# Patient Record
Sex: Male | Born: 1970 | Race: Black or African American | Hispanic: No | Marital: Married | Smoking: Never smoker
Health system: Southern US, Community
[De-identification: ages and names within clinical notes are randomized; demographics above are authoritative.]

## PROBLEM LIST (undated history)

## (undated) DIAGNOSIS — D689 Coagulation defect, unspecified: Secondary | ICD-10-CM

## (undated) DIAGNOSIS — J309 Allergic rhinitis, unspecified: Secondary | ICD-10-CM

## (undated) DIAGNOSIS — E7800 Pure hypercholesterolemia, unspecified: Secondary | ICD-10-CM

## (undated) HISTORY — DX: Coagulation defect, unspecified: D68.9

## (undated) HISTORY — DX: Pure hypercholesterolemia, unspecified: E78.00

## (undated) HISTORY — DX: Allergic rhinitis, unspecified: J30.9

## (undated) HISTORY — PX: OTHER SURGICAL HISTORY: SHX169

---

## 2011-10-19 ENCOUNTER — Ambulatory Visit (INDEPENDENT_AMBULATORY_CARE_PROVIDER_SITE_OTHER): Payer: BC Managed Care – PPO | Admitting: Internal Medicine

## 2011-10-19 ENCOUNTER — Encounter: Payer: Self-pay | Admitting: Internal Medicine

## 2011-10-19 VITALS — BP 125/73 | HR 75 | Temp 98.3°F | Ht 69.0 in | Wt 179.0 lb

## 2011-10-19 DIAGNOSIS — D1739 Benign lipomatous neoplasm of skin and subcutaneous tissue of other sites: Secondary | ICD-10-CM

## 2011-10-19 DIAGNOSIS — D171 Benign lipomatous neoplasm of skin and subcutaneous tissue of trunk: Secondary | ICD-10-CM

## 2011-10-19 DIAGNOSIS — M6580 Other synovitis and tenosynovitis, unspecified site: Secondary | ICD-10-CM

## 2011-10-19 DIAGNOSIS — M778 Other enthesopathies, not elsewhere classified: Secondary | ICD-10-CM

## 2011-10-19 DIAGNOSIS — M65979 Unspecified synovitis and tenosynovitis, unspecified ankle and foot: Secondary | ICD-10-CM

## 2011-10-19 DIAGNOSIS — J309 Allergic rhinitis, unspecified: Secondary | ICD-10-CM | POA: Insufficient documentation

## 2011-10-19 DIAGNOSIS — M659 Synovitis and tenosynovitis, unspecified: Secondary | ICD-10-CM

## 2011-10-19 DIAGNOSIS — Z23 Encounter for immunization: Secondary | ICD-10-CM

## 2011-10-19 DIAGNOSIS — M7751 Other enthesopathy of right foot: Secondary | ICD-10-CM

## 2011-10-19 DIAGNOSIS — Z0000 Encounter for general adult medical examination without abnormal findings: Secondary | ICD-10-CM

## 2011-10-19 LAB — LIPID PANEL: HDL: 49.4 mg/dL (ref >39.00)

## 2011-10-19 LAB — LDL CHOLESTEROL, DIRECT: Direct LDL: 196.2 mg/dL

## 2011-10-19 NOTE — Progress Notes (Signed)
Subjective:    Patient ID: Bryan [name], male    DOB: 30-Nov-1971, 40 y.o.   MRN: [id number]  HPI Wants to establish for care Sent in by wife and mother  Has been healthy No surgery  No regular exercise--hopes to start working out more  No current outpatient prescriptions on file prior to visit.    No Known Allergies  No past medical history on file.  Past Surgical History  Procedure Date  . None     Family History  Problem Relation Age of Onset  . Cancer Mother     Breast  . Alcohol abuse Father   . Diabetes Paternal Aunt   . Heart disease Paternal Aunt   . Hypertension Neg Hx     History   Social History  . Marital Status: Married    Spouse Name: N/A    Number of Children: 3  . Years of Education: N/A   Occupational History  . 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 Smoking status: Never Smoker   . Smokeless tobacco: Never Used  . Alcohol Use: No  . Drug Use: No  . Sexually Active: Not on file   Other Topics Concern  . Not on file   Social History Narrative   3 children---still all school age   Review of Systems  Constitutional: Negative for fatigue and unexpected weight change.       Wears seat belt  HENT: Positive for congestion and rhinorrhea. Negative for hearing loss, dental problem and tinnitus.        Mild spring allergies occ OTC meds and Neti pot  Eyes: Negative for visual disturbance.       No diplopia or unilateral vision loss  Respiratory: Negative for cough, chest tightness and shortness of breath.   Cardiovascular: Negative for chest pain, palpitations and leg swelling.  Gastrointestinal: Negative for nausea, vomiting, abdominal pain, constipation and blood in stool.       Has small lump on right mid abdomen No pain or recent change Goes back 20 years  Genitourinary: Negative for dysuria, urgency, frequency and difficulty urinating.       No sexual problems  Musculoskeletal: Positive for arthralgias.  Negative for myalgias, back pain and joint swelling.       Intermittent right elbow pain---with movement Goes back 6 months No known injury  Skin: Negative for rash.       No suspicious lesions  Neurological: Negative for dizziness, syncope, weakness, light-headedness, numbness and headaches.  Hematological: Negative for adenopathy. Does not bruise/bleed easily.  Psychiatric/Behavioral: Negative for sleep disturbance and dysphoric mood. The patient is not nervous/anxious.        Objective:   Physical Exam  Constitutional: He appears well-developed and well-nourished. No distress.  HENT:  Head: Normocephalic and atraumatic.  Right Ear: External ear normal.  Left Ear: External ear normal.  Mouth/Throat: Oropharynx is clear and moist. No oropharyngeal exudate.       TMs normal  Eyes: Conjunctivae and EOM are normal. Pupils are equal, round, and reactive to light.       Fundi benign  Neck: Normal range of motion. Neck supple. No thyromegaly present.  Cardiovascular: Normal rate, regular rhythm, normal heart sounds and intact distal pulses.  Exam reveals no gallop.   No murmur heard. Pulmonary/Chest: Effort normal and breath sounds normal. No respiratory distress. He has no wheezes. He has no rales.  Abdominal: Soft. There is no tenderness.  Irregular ~2x3 cm mass in subQ tissues of right lower quadrant No inflammation or tenderness  Musculoskeletal: Normal range of motion. He exhibits no edema and no tenderness.       Mild pain in medial right elbow with active extension and flexion against tension No swelling or tenderness  Lymphadenopathy:    He has no cervical adenopathy.  Neurological: He is alert.  Skin: Skin is warm. No rash noted.  Psychiatric: He has a normal mood and affect. His behavior is normal. Judgment and thought content normal.          Assessment & Plan:

## 2011-10-19 NOTE — Assessment & Plan Note (Signed)
Probably overuse at work Better now Discussed strap, ice, NSAIDs

## 2011-10-19 NOTE — Assessment & Plan Note (Signed)
Large, irregular mass in subQ tissues that goes back 20 years If bothers him, would send to surgeon to remove

## 2011-10-19 NOTE — Assessment & Plan Note (Signed)
Fairly healthy Discussed fitness Tdap Check lipid and glucose

## 2011-10-20 ENCOUNTER — Encounter: Payer: Self-pay | Admitting: Internal Medicine

## 2021-06-10 ENCOUNTER — Encounter: Payer: Self-pay | Admitting: Medical

## 2021-06-10 ENCOUNTER — Ambulatory Visit: Payer: BC Managed Care – PPO | Admitting: Medical

## 2021-06-10 VITALS — BP 130/86 | HR 78 | Ht 69.0 in | Wt 184.2 lb

## 2021-06-10 DIAGNOSIS — K6289 Other specified diseases of anus and rectum: Secondary | ICD-10-CM

## 2021-06-10 DIAGNOSIS — Z1211 Encounter for screening for malignant neoplasm of colon: Secondary | ICD-10-CM | POA: Diagnosis not present

## 2021-06-10 MED ORDER — HYDROCORTISONE 2.5 % EX CREA: TOPICAL_CREAM | Freq: Two times a day (BID) | CUTANEOUS | 1 refills | Status: DC

## 2021-06-10 MED ORDER — HYDROCORTISONE ACETATE 25 MG RE SUPP: 25.0000 mg | Freq: Two times a day (BID) | RECTAL | 1 refills | Status: DC

## 2021-06-10 NOTE — Progress Notes (Signed)
Subjective:  Bryan [name] is a 50 y.o. male who presents for Chief Complaint  Patient presents with   New Patient (Initial Visit)    Anal discomfort for about 1 month      Here as a new patient.  Been having uncomfortable feeling in anus the past month.  Has hx/o hemorrhoids, feels the discomfort worse if sitting, particular on hard surfaces.  He notes daily BM typically twice daly. Denies constipation, no diarrhea, no large stool, no blood in stool regularly.  Has had occasional blood in stool that he attributes to hemorrhoids.  Has had a few hemorrhoids "come out" in the past.  No severe or sharp pain but nagging ache.  Doesn't do a lot of heavy lifting or straining on the toilet.    Is married.  He notes some reading on the toilet.    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    No abdominal , no back pain.  No prior rectal procedures or hemorrhoid procedure.    No other aggravating or relieving factors.    No other c/o.  The following portions of the patient's history were reviewed and updated as appropriate: allergies, current medications, past family history, past medical history, past social history, past surgical history and problem list.  ROS Otherwise as in subjective above    Objective: BP 130/86   Pulse 78   Ht 5\' 9"  (1.753 m)   Wt 184 lb 3.2 oz (83.6 kg)   SpO2 96%   BMI 27.20 kg/m   General appearance: alert, no distress, well developed, well nourished Anus: Normal appearing, no external lesions or hemorrhoid or fissure Abdomen nontender no mass or hepatosplenomegaly    Assessment: Encounter Diagnoses  Name Primary?   Rectal discomfort Yes   Screen for colon cancer      Plan: Likely internal inflamed hemorrhoids periodically.  Advised when he has flareups to use sits baths or hot soapy baths, can use medication as below as needed short-term at a time, not chronic daily use.  Advised good hydration, good fiber  intake.  Avoid sitting for long on the toilet.  Avoid heavy lifting and straining for prolonged periods.    We will go ahead and refer for colonoscopy as he has not had one yet  Bryan [name] was seen today for new patient (initial visit).  Diagnoses and all orders for this visit:  Rectal discomfort  Screen for colon cancer -     Ambulatory referral to Gastroenterology  Other orders -     hydrocortisone 2.5 % cream; Apply topically 2 (two) times daily. -     hydrocortisone (ANUSOL-HC) 25 MG suppository; Place 1 suppository (25 mg total) rectally 2 (two) times daily.   Follow up: for physical

## 2021-07-20 ENCOUNTER — Encounter: Payer: Self-pay | Admitting: Gastroenterology

## 2021-07-20 ENCOUNTER — Encounter: Payer: Self-pay | Admitting: Nurse Practitioner

## 2021-08-18 ENCOUNTER — Other Ambulatory Visit (INDEPENDENT_AMBULATORY_CARE_PROVIDER_SITE_OTHER): Payer: BC Managed Care – PPO

## 2021-08-18 ENCOUNTER — Encounter: Payer: Self-pay | Admitting: Nurse Practitioner

## 2021-08-18 ENCOUNTER — Telehealth: Payer: Self-pay | Admitting: Nurse Practitioner

## 2021-08-18 ENCOUNTER — Ambulatory Visit (INDEPENDENT_AMBULATORY_CARE_PROVIDER_SITE_OTHER): Payer: BC Managed Care – PPO | Admitting: Nurse Practitioner

## 2021-08-18 VITALS — BP 112/64 | HR 70 | Ht 70.0 in | Wt 180.0 lb

## 2021-08-18 DIAGNOSIS — K625 Hemorrhage of anus and rectum: Secondary | ICD-10-CM

## 2021-08-18 DIAGNOSIS — K6289 Other specified diseases of anus and rectum: Secondary | ICD-10-CM

## 2021-08-18 DIAGNOSIS — C20 Malignant neoplasm of rectum: Secondary | ICD-10-CM | POA: Insufficient documentation

## 2021-08-18 LAB — COMPREHENSIVE METABOLIC PANEL
ALT: 20 U/L (ref 0–53)
AST: 12 U/L (ref 0–37)
Albumin: 4.4 g/dL (ref 3.5–5.2)
Alkaline Phosphatase: 97 U/L (ref 39–117)
BUN: 11 mg/dL (ref 6–23)
CO2: 30 mEq/L (ref 19–32)
Calcium: 9.9 mg/dL (ref 8.4–10.5)
Chloride: 102 mEq/L (ref 96–112)
Creatinine, Ser: 1.15 mg/dL (ref 0.40–1.50)
GFR: 73.42 mL/min (ref >60.00)
Glucose, Bld: 79 mg/dL (ref 70–99)
Potassium: 4 mEq/L (ref 3.5–5.1)
Sodium: 139 mEq/L (ref 135–145)
Total Bilirubin: 0.4 mg/dL (ref 0.2–1.2)
Total Protein: 7.3 g/dL (ref 6.0–8.3)

## 2021-08-18 LAB — CBC
HCT: 44 % (ref 39.0–52.0)
Hemoglobin: 14.5 g/dL (ref 13.0–17.0)
MCHC: 33 g/dL (ref 30.0–36.0)
MCV: 78.8 fl (ref 78.0–100.0)
Platelets: 309 10*3/uL (ref 150.0–400.0)
RBC: 5.58 Mil/uL (ref 4.22–5.81)
RDW: 13.7 % (ref 11.5–15.5)
WBC: 5.6 10*3/uL (ref 4.0–10.5)

## 2021-08-18 MED ORDER — IBUPROFEN 800 MG PO TABS: 800.0000 mg | ORAL_TABLET | Freq: Two times a day (BID) | ORAL | 0 refills | Status: DC | PRN

## 2021-08-18 NOTE — Telephone Encounter (Signed)
Patient called requesting a script to be sent for Ibuprofen 800 mg.

## 2021-08-18 NOTE — Telephone Encounter (Signed)
RX sent

## 2021-08-18 NOTE — Progress Notes (Signed)
08/18/2021 [facility] [id number] March 06, 1971   CHIEF COMPLAINT: Rectal pain   HISTORY OF PRESENT ILLNESS:  Bryan [name] is a 50 year old male with a past medical history of hypercholesterolemia.  No past surgical history.  He presents to our office today as referred by Chana Bode PA-C for further evaluation regarding rectal bleeding and rectal pain which started 2 to 3 months ago and has progressively worsened.  He is also experiencing pain to the left gluteal area which radiates down the left leg for the past 1 to 2 months.  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  He is taking Ibuprofen 800 mg once daily for his rectal pain and sciatica symptoms.  He reports having a prior history of hemorrhoids, denies ever having hemorrhoid banding or hemorrhoid surgery.  Never had a screening colonoscopy.  He describes seeing a small amount of bright red blood on the toilet tissue and on the stool which occurs once weekly for the past 2 to 3 months. No purulent rectal discharge.  He used Hydrocortisone cream to the anal area without improvement.  He is passing 3-4 small formed stools for the past few months, previously passed a larger normal bowel movement once daily.  No new medications or diet changes.  No fever, sweats or chills.  No weight loss.  No known family history of colorectal cancer.  No other complaints at this time.  Past Medical History:  Diagnosis Date   Allergic rhinitis, cause unspecified    spring only   Hypercholesteremia    Past Surgical History:  Procedure Laterality Date   None     Social History: He is married.  He has 1 son and 2 daughters.  He is a Musician.  Non-smoker.  No alcohol use.  No drug use.  Family History: Mother had breast cancer. Father deceased age 24 alcohol related cirrhosis. Maternal great grandfather had prostate cancer.   No Known Allergies   Outpatient Encounter Medications as of 08/18/2021   Medication Sig   hydrocortisone 2.5 % cream Apply topically 2 (two) times daily.   ibuprofen (ADVIL) 800 MG tablet Take 800 mg by mouth every 8 (eight) hours as needed.   [DISCONTINUED] hydrocortisone (ANUSOL-HC) 25 MG suppository Place 1 suppository (25 mg total) rectally 2 (two) times daily.   No facility-administered encounter medications on file as of 08/18/2021.   REVIEW OF SYSTEMS: Gen: Denies fever, sweats or chills. No weight loss.  CV: Denies chest pain, palpitations or edema. Resp: Denies cough, shortness of breath of hemoptysis.  GI: See HPI.  No dysphagia or heartburn. GU : Denies urinary burning, blood in urine, increased urinary frequency or incontinence. MS: Denies joint pain, muscles aches or weakness. Derm: Denies rash, itchiness, skin lesions or unhealing ulcers. Psych: Denies depression, anxiety or memory loss. Heme: Denies bruising, bleeding. Neuro:  Denies headaches, dizziness or paresthesias. Endo:  Denies any problems with DM, thyroid or adrenal function.  PHYSICAL EXAM: BP 112/64   Pulse 70   Ht '5\' 10"'$  (1.778 m)   Wt 180 lb (81.6 kg)   BMI 25.83 kg/m  General: 50 year old male in no acute distress. Head: Normocephalic and atraumatic. Eyes:  Sclerae non-icteric, conjunctive pink. Ears: Normal auditory acuity. Mouth: Dentition intact. No ulcers or lesions.  Neck: Supple, no lymphadenopathy or thyromegaly.  Lungs: Clear bilaterally to auscultation without wheezes, crackles or rhonchi. Heart: Regular rate and rhythm. No murmur, rub  or gallop appreciated.  Abdomen: Soft, nontender, non distended. No masses. No hepatosplenomegaly. Normoactive bowel sounds x 4 quadrants.  Rectal: No external hemorrhoids or anal fissures.  Small internal hemorrhoids palpated without prolapse.  No mass.  No blood or stool in the rectal vault.  Patient tolerated rectal exam without distress.  CMA Melissa present during exam. Musculoskeletal: Symmetrical with no gross  deformities. Skin: Warm and dry. No rash or lesions on visible extremities. Extremities: No edema. Neurological: Alert oriented x 4, no focal deficits.  Psychological:  Alert and cooperative. Normal mood and affect.  ASSESSMENT AND PLAN:  47) 50 year old male with rectal bleeding and rectal pain.  No significant findings on rectal exam today explain his symptoms. -CBC, CMP -Colonoscopy benefits and risks discussed including risk with sedation, risk of bleeding, perforation and infection.  Patient was offered a colonoscopy date next week, however, due to his scheduling preference he scheduled a colonoscopy with Dr. Candis Schatz  on 09/14/2021 -Patient will call our office if his symptoms worsen -Benefiber 1 tablespoon daily.  MiraLAX nightly as needed. -Further recommendations to be determined after colonoscopy completed  2) Constipation -See plan in #1  3) Left gluteal pain with sciatica -Follow-up with PCP, consider lumbar sacral spine MRI       CC:  Tysinger, Camelia Eng, PA-C

## 2021-08-18 NOTE — Telephone Encounter (Signed)
Please advise 

## 2021-08-18 NOTE — Patient Instructions (Addendum)
If you are age 52 or younger, your body mass index should be between 19-25. Your Body mass index is 25.83 kg/m. If this is out of the aformentioned range listed, please consider follow up with your Primary Care Provider.   PROCEDURES: You have been scheduled for a colonoscopy. Please follow the written instructions given to you at your visit today. Please pick up your prep supplies at the pharmacy within the next 1-3 days. If you use inhalers (even only as needed), please bring them with you on the day of your procedure.  LABS:  Lab work has been ordered for you today. Our lab is located in the basement. Press "B" on the elevator. The lab is located at the first door on the left as you exit the elevator.  RECOMMENDATIONS: Miralax- Dissolve one capful in 8 ounces of water and drink before bed. Benefiber- 1 tablespoon daily.  Please call our office if your symptoms worsen. It was great seeing you today! Thank you for entrusting me with your care and choosing [facility].  Noralyn Pick, CRNP  The [facility] providers would like to encourage you to use [facility] to communicate with providers for non-urgent requests or questions.  Due to long hold times on the telephone, sending your provider a message by [facility] may be faster and more efficient way to get a response. Please allow 48 business hours for a response.  Please remember that this is for non-urgent requests/questions.

## 2021-08-18 NOTE — Telephone Encounter (Signed)
Bryan Pick, NP  [name], CMA Caller: Unspecified (Today,  3:29 PM) [name], ok to send RX for Ibuprofen '800mg'$  one tab po bid PRN to take with food # 20, no refills. If he is needing the Ibuprofen for back/sciatica pain he needs to contact his pcp for additional refills. Thx

## 2021-08-20 NOTE — Progress Notes (Signed)
Agree with the assessment and plan as outlined by Jessica Zehr, PA-C.  Olis Viverette E. Isadora Delorey, MD  [facility]  

## 2021-08-21 ENCOUNTER — Telehealth: Payer: Self-pay

## 2021-08-21 NOTE — Telephone Encounter (Signed)
-----   Message from Bryan Pick, NP sent at 08/18/2021  5:11 PM EDT ----- Bryan [name], please contact the patient and inform him his labs were normal.  Thank you

## 2021-08-21 NOTE — Telephone Encounter (Signed)
Notified patient of test results , patient expressed understanding and agreement, no further questions at this time.

## 2021-09-01 ENCOUNTER — Ambulatory Visit: Payer: Self-pay | Admitting: Gastroenterology

## 2021-09-14 ENCOUNTER — Other Ambulatory Visit: Payer: Self-pay

## 2021-09-14 ENCOUNTER — Ambulatory Visit (AMBULATORY_SURGERY_CENTER): Payer: BC Managed Care – PPO | Admitting: Gastroenterology

## 2021-09-14 ENCOUNTER — Other Ambulatory Visit: Payer: Self-pay | Admitting: Gastroenterology

## 2021-09-14 ENCOUNTER — Other Ambulatory Visit: Payer: BC Managed Care – PPO

## 2021-09-14 ENCOUNTER — Encounter: Payer: Self-pay | Admitting: Gastroenterology

## 2021-09-14 VITALS — BP 126/85 | HR 74 | Temp 98.4°F | Resp 13 | Ht 70.0 in | Wt 180.0 lb

## 2021-09-14 DIAGNOSIS — K921 Melena: Secondary | ICD-10-CM

## 2021-09-14 DIAGNOSIS — K6289 Other specified diseases of anus and rectum: Secondary | ICD-10-CM

## 2021-09-14 DIAGNOSIS — C20 Malignant neoplasm of rectum: Secondary | ICD-10-CM

## 2021-09-14 DIAGNOSIS — K625 Hemorrhage of anus and rectum: Secondary | ICD-10-CM | POA: Diagnosis not present

## 2021-09-14 HISTORY — DX: Malignant neoplasm of rectum: C20

## 2021-09-14 MED ORDER — IBUPROFEN 800 MG PO TABS: 800.0000 mg | ORAL_TABLET | Freq: Two times a day (BID) | ORAL | 1 refills | Status: DC | PRN

## 2021-09-14 MED ORDER — SODIUM CHLORIDE 0.9 % IV SOLN: 500.0000 mL | Freq: Once | INTRAVENOUS | Status: DC

## 2021-09-14 NOTE — Op Note (Signed)
[place] Patient Name: Bryan [name] Procedure Date: 09/14/2021 3:26 PM MRN: [id number] Endoscopist: Nicki Reaper E. Candis Schatz , MD Age: 50 Referring MD:  Date of Birth: [birth year] Gender: Male Account #: [id number] Procedure:                Colonoscopy Indications:              Hematochezia, Rectal pain Medicines:                Monitored Anesthesia Care Procedure:                Pre-Anesthesia Assessment:                           - Prior to the procedure, a History and Physical                            was performed, and patient medications and                            allergies were reviewed. The patient's tolerance of                            previous anesthesia was also reviewed. The risks                            and benefits of the procedure and the sedation                            options and risks were discussed with the patient.                            All questions were answered, and informed consent                            was obtained. Prior Anticoagulants: The patient has                            taken no previous anticoagulant or antiplatelet                            agents. ASA Grade Assessment: II - A patient with                            mild systemic disease. After reviewing the risks                            and benefits, the patient was deemed in                            satisfactory condition to undergo the procedure.                           After obtaining informed consent, the colonoscope  was passed under direct vision. Throughout the                            procedure, the patient's blood pressure, pulse, and                            oxygen saturations were monitored continuously. The                            CF [id number] was introduced through the anus                            and advanced to the the terminal ileum, with                            identification of the appendiceal  orifice and IC                            valve. The colonoscopy was performed without                            difficulty. The patient tolerated the procedure                            well. The quality of the bowel preparation was                            good. The terminal ileum, ileocecal valve,                            appendiceal orifice, and rectum were photographed. Scope In: 3:48:07 PM Scope Out: 4:09:30 PM Scope Withdrawal Time: 0 hours 16 minutes 0 seconds  Total Procedure Duration: 0 hours 21 minutes 23 seconds  Findings:                 The perianal examination was normal.                           The digital rectal exam revealed a firm rectal mass                            palpated 1-2 cm from the anal verge. The mass was                            non-circumferential and located predominantly at                            the left bowel wall.                           An ulcerated non-obstructing medium-sized friable                            mass was found in the distal rectum. The mass was  non-circumferential. The mass measured three cm in                            length. No bleeding was present. Biopsies (5 passes                            total for 10 total biopsy specimens)were taken with                            a cold forceps for histology. Estimated blood loss                            was minimal.                           The exam was otherwise normal throughout the                            examined colon.                           The terminal ileum appeared normal.                           No additional abnormalities were found on                            retroflexion. Complications:            No immediate complications. Estimated Blood Loss:     Estimated blood loss was minimal. 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                            Biopsied.                            - The examined portion of the ileum was normal. Recommendation:           - Patient has a contact number available for                            emergencies. The signs and symptoms of potential                            delayed complications were discussed with the                            patient. Return to normal activities tomorrow.                            Written discharge instructions were provided to the                            patient.                           -  Resume previous diet.                           - Continue present medications.                           - Await pathology results.                           - Perform a CT scan (computed tomography) of chest,                            abdomen and pelvis with contrast at appointment to                            be scheduled.                           - Obtain baseline CEA level                           - Refer to a colo-rectal surgeon (Dr. Dema Severin) at                            appointment to be scheduled. Clarabell Matsuoka E. Candis Schatz, MD 09/14/2021 4:22:25 PM This report has been signed electronically.

## 2021-09-14 NOTE — Progress Notes (Signed)
Pt's states no medical or surgical changes since previsit or office visit. VS assessed by N.C ?

## 2021-09-14 NOTE — Progress Notes (Signed)
Called to room to assist during endoscopic procedure.  Patient ID and intended procedure confirmed with present staff. Received instructions for my participation in the procedure from the performing physician.   Pathology sent rush per DO   Pt is requesting Ibuprofen 800 mg BID PRN moderate pain- Spoke with Dr. Candis Schatz and ok received to refill this for 1 refill Ibuprofen 800 mg po BID PRN moderate pain, #30, 1 refill

## 2021-09-14 NOTE — Patient Instructions (Signed)
Await pathology results. Will get baseline CEA level today in basement before leaving. CT scan of chest, abdomen, and pelvis with contrast will be ordered. A referral to a colo-rectal surgeon (Dr. Dema Severin) will be made.    YOU HAD AN ENDOSCOPIC PROCEDURE TODAY AT [facility]:   Refer to the procedure report that was given to you for any specific questions about what was found during the examination.  If the procedure report does not answer your questions, please call your gastroenterologist to clarify.  If you requested that your care partner not be given the details of your procedure findings, then the procedure report has been included in a sealed envelope for you to review at your convenience later.  YOU SHOULD EXPECT: Some feelings of bloating in the abdomen. Passage of more gas than usual.  Walking can help get rid of the air that was put into your GI tract during the procedure and reduce the bloating. If you had a lower endoscopy (such as a colonoscopy or flexible sigmoidoscopy) you may notice spotting of blood in your stool or on the toilet paper. If you underwent a bowel prep for your procedure, you may not have a normal bowel movement for a few days.  Please Note:  You might notice some irritation and congestion in your nose or some drainage.  This is from the oxygen used during your procedure.  There is no need for concern and it should clear up in a day or so.  SYMPTOMS TO REPORT IMMEDIATELY:  Following lower endoscopy (colonoscopy or flexible sigmoidoscopy):  Excessive amounts of blood in the stool  Significant tenderness or worsening of abdominal pains  Swelling of the abdomen that is new, acute  Fever of 100F or higher  For urgent or emergent issues, a gastroenterologist can be reached at any hour by calling [phone]. Do not use MyChart messaging for urgent concerns.    DIET:  We do recommend a small meal at first, but then you may proceed to your regular  diet.  Drink plenty of fluids but you should avoid alcoholic beverages for 24 hours.  ACTIVITY:  You should plan to take it easy for the rest of today and you should NOT DRIVE or use heavy machinery until tomorrow (because of the sedation medicines used during the test).    FOLLOW UP: Our staff will call the number listed on your records 48-72 hours following your procedure to check on you and address any questions or concerns that you may have regarding the information given to you following your procedure. If we do not reach you, we will leave a message.  We will attempt to reach you two times.  During this call, we will ask if you have developed any symptoms of COVID 19. If you develop any symptoms (ie: fever, flu-like symptoms, shortness of breath, cough etc.) before then, please call [phone].  If you test positive for Covid 19 in the 2 weeks post procedure, please call and report this information to [place].    If any biopsies were taken you will be contacted by phone or by letter within the next 1-3 weeks.  Please call us at [phone] if you have not heard about the biopsies in 3 weeks.    SIGNATURES/CONFIDENTIALITY: You and/or your care partner have signed paperwork which will be entered into your electronic medical record.  These signatures attest to the fact that that the information above on your After Visit Summary has been reviewed  and is understood.  Full responsibility of the confidentiality of this discharge information lies with you and/or your care-partner.

## 2021-09-14 NOTE — Progress Notes (Signed)
History and Physical Interval Note:  No changes in patient's symptoms or medical history since his clinic visit with NP [facility] on Aug 23rd  09/14/2021 3:44 PM  Bryan [name]  has presented today for endoscopic procedure(s), with the diagnosis of  Encounter Diagnoses  Name Primary?   Rectal pain Yes   Rectal bleeding   .  The various methods of evaluation and treatment have been discussed with the patient and/or family. After consideration of risks, benefits and other options for treatment, the patient has consented to  the endoscopic procedure(s).   The patient's history has been reviewed, patient examined, no change in status, stable for endoscopic procedure(s).  I have reviewed the patient's chart and labs.  Questions were answered to the patient's satisfaction.     Ayad Nieman E. Candis Schatz, MD [facility]

## 2021-09-15 ENCOUNTER — Telehealth: Payer: Self-pay

## 2021-09-15 DIAGNOSIS — C189 Malignant neoplasm of colon, unspecified: Secondary | ICD-10-CM

## 2021-09-15 LAB — CEA: CEA: 3.9 ng/mL — ABNORMAL HIGH

## 2021-09-15 NOTE — Telephone Encounter (Signed)
Per Dr. Candis Schatz patient needs CT chest, abd, and pelvis as well as a MRI of the pelvis for invasive adenocarcinoma of colon.      Patient notified and he understands that they will be contacted directly by [facility] Scheduling to arrange MRI.  He understands I will call him tomorrow with a date and time for the CT scan. He also understands that he will be contacted directly by [facility] with surgical referral.

## 2021-09-16 ENCOUNTER — Telehealth: Payer: Self-pay | Admitting: *Deleted

## 2021-09-16 NOTE — Telephone Encounter (Signed)
  Follow up Call-  Call back number 09/14/2021  Post procedure Call Back phone  # [phone]  Permission to leave phone message Yes  Some recent data might be hidden     Patient questions:  Do you have a fever, pain , or abdominal swelling? No. Pain Score  0 *  Have you tolerated food without any problems? Yes.    Have you been able to return to your normal activities? Yes.    Do you have any questions about your discharge instructions: Diet   No. Medications  No. Follow up visit  No.  Do you have questions or concerns about your Care? No.  Actions: * If pain score is 4 or above: No action needed, pain <4.   Have you developed a fever since your procedure? no  2.   Have you had an respiratory symptoms (SOB or cough) since your procedure? no  3.   Have you tested positive for COVID 19 since your procedure no  4.   Have you had any family members/close contacts diagnosed with the COVID 19 since your procedure?  no   If yes to any of these questions please route to Joylene John, RN and Joella Prince, RN

## 2021-09-16 NOTE — Telephone Encounter (Signed)
Patient notified that CT scan has been scheduled for 09/17/21 at [facility].  He verbalized understanding of instructions and appointment details.

## 2021-09-17 ENCOUNTER — Other Ambulatory Visit: Payer: Self-pay

## 2021-09-17 ENCOUNTER — Ambulatory Visit (INDEPENDENT_AMBULATORY_CARE_PROVIDER_SITE_OTHER)
Admission: RE | Admit: 2021-09-17 | Discharge: 2021-09-17 | Disposition: A | Discharge status: Home / Self Care | Payer: BC Managed Care – PPO | Source: Ambulatory Visit | Attending: Gastroenterology | Admitting: Gastroenterology

## 2021-09-17 ENCOUNTER — Telehealth: Payer: Self-pay | Admitting: Gastroenterology

## 2021-09-17 DIAGNOSIS — C189 Malignant neoplasm of colon, unspecified: Secondary | ICD-10-CM | POA: Diagnosis not present

## 2021-09-17 IMAGING — CT CT CHEST-ABD-PELV W/ CM
2 of 5 series · 14 of 46 positions shown, 16 images · IV contrast (OMNIPAQUE 300)
Comparison: None.

CLINICAL DATA: Newly diagnosed colon/rectal adenocarcinoma.
Invasive adenocarcinoma. Staging.

EXAM:
CT CHEST, ABDOMEN, AND PELVIS WITH CONTRAST
TECHNIQUE: Multidetector CT imaging of the chest, abdomen and pelvis was
performed following the standard protocol during bolus
administration of intravenous contrast.
CONTRAST:  80mL OMNIPAQUE IOHEXOL 350 MG/ML SOLN

[Series 3: cap with · axial · 0.70mm/px · z∈[-408,+96]mm · 11 of 121 slices shown, 13 images]
[im 10/121  soft-tissue]
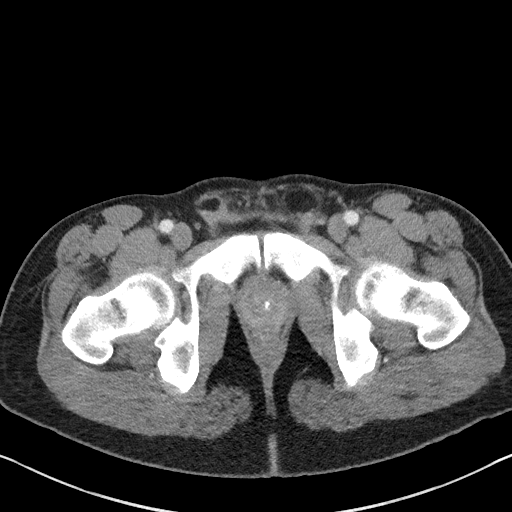
[im 10/121  bone]
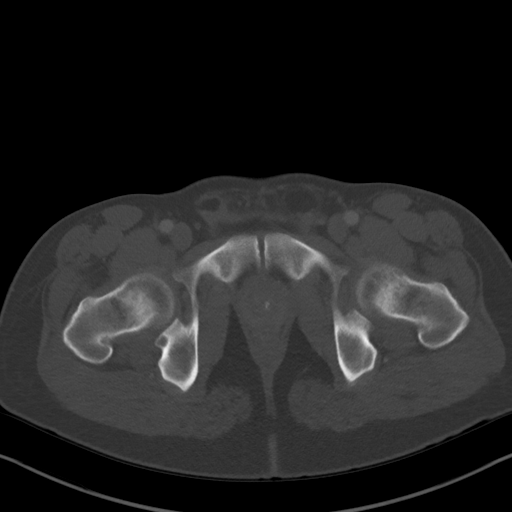
[im 19/121  soft-tissue]
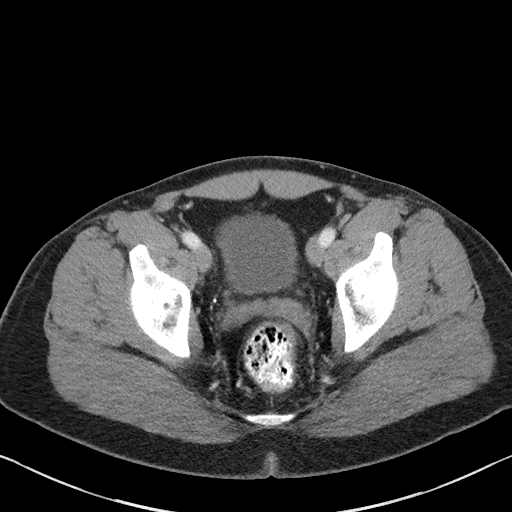
[im 28/121  soft-tissue]
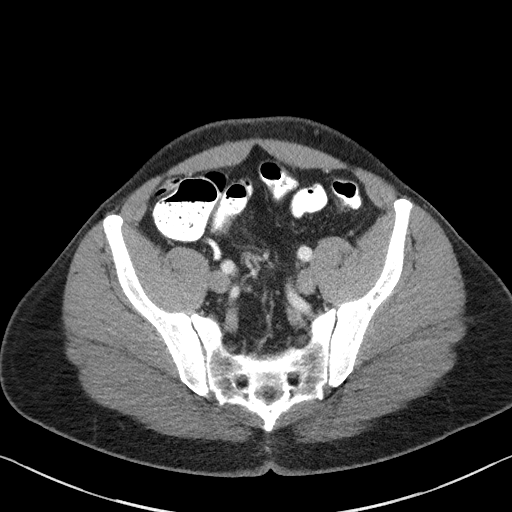
[im 37/121  soft-tissue]
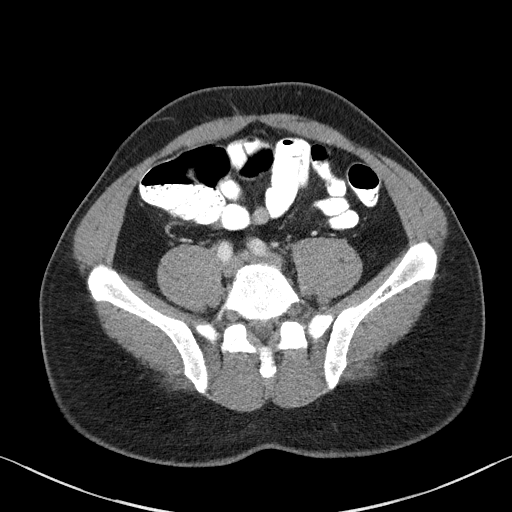
[im 47/121  soft-tissue]
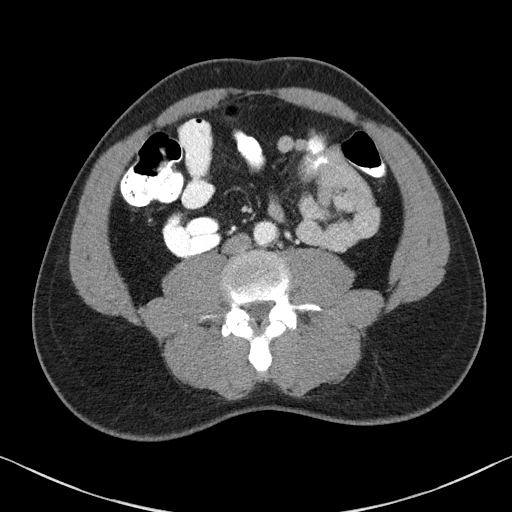
[im 65/121  soft-tissue]
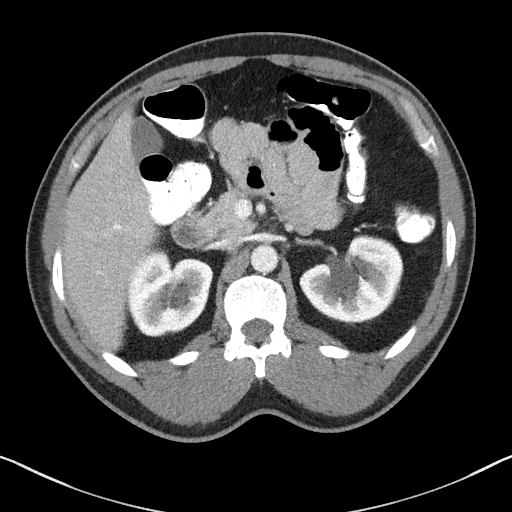
[im 74/121  soft-tissue]
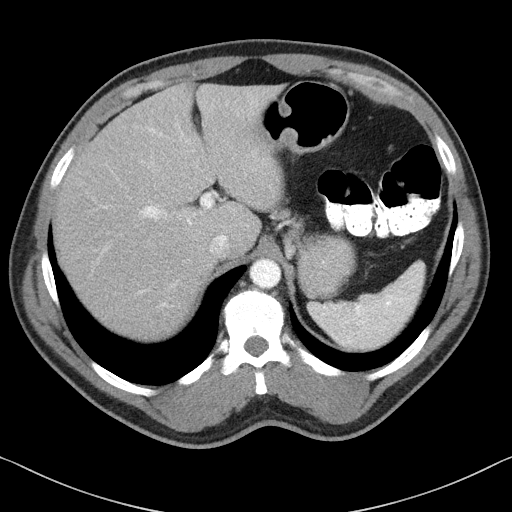
[im 84/121  soft-tissue]
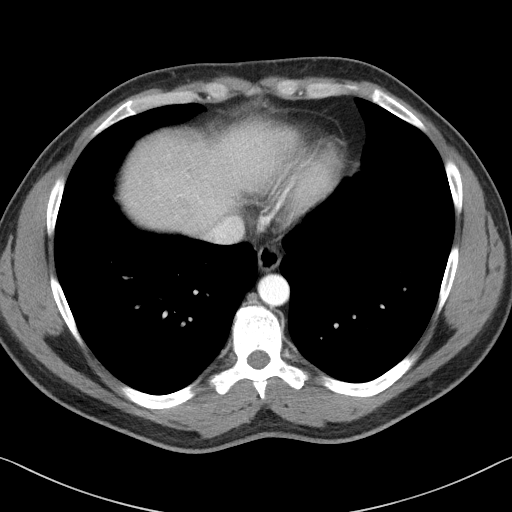
[im 93/121  soft-tissue]
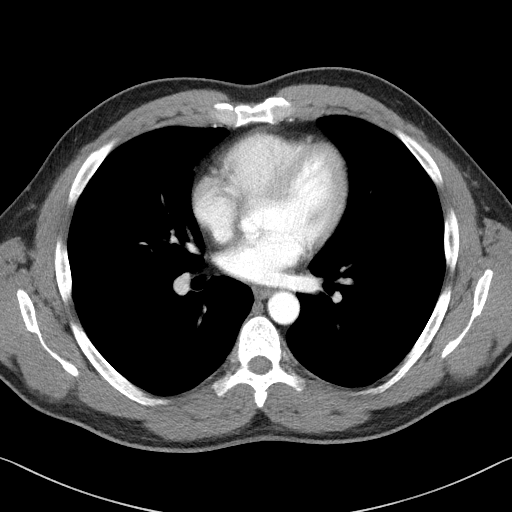
[im 93/121  bone]
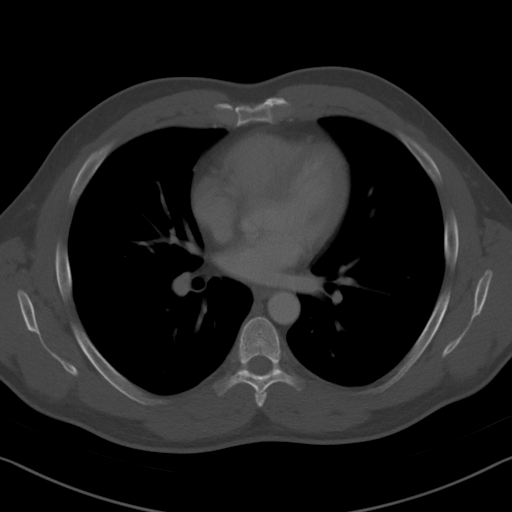
[im 102/121  soft-tissue]
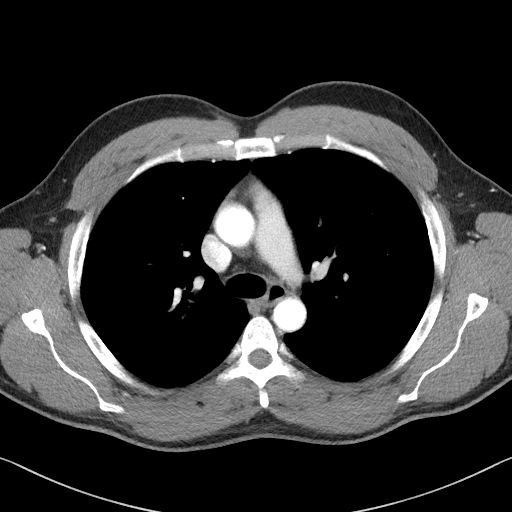
[im 111/121  soft-tissue]
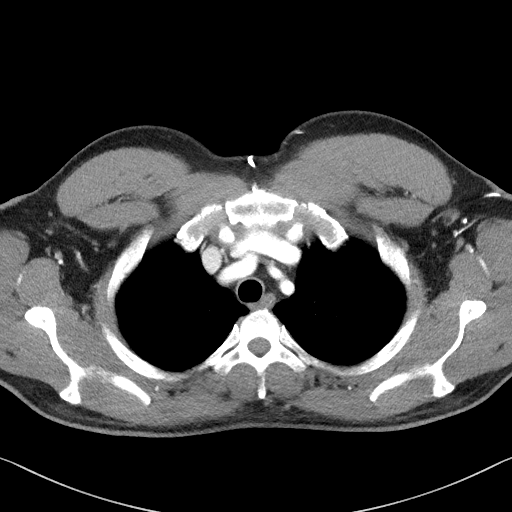

[Series 6: coronal · coronal · 0.71mm/px · 3 of 151 slices shown]
[im 51/151  soft-tissue]
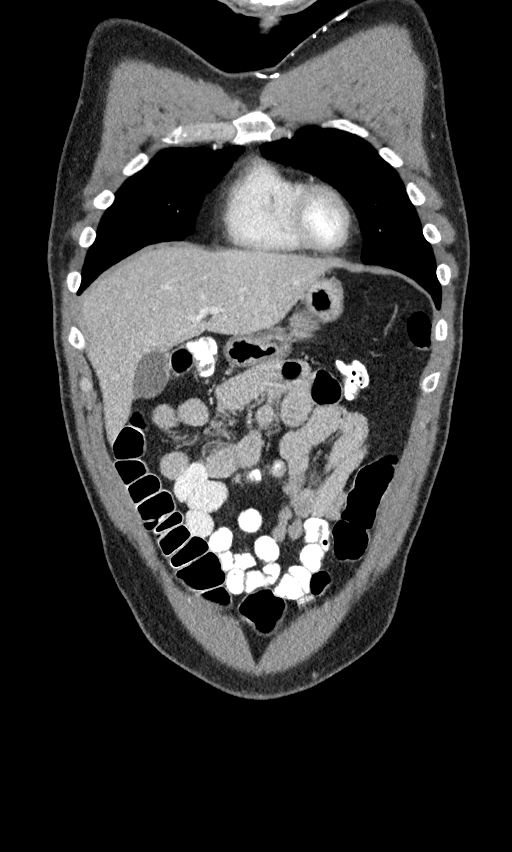
[im 67/151  soft-tissue]
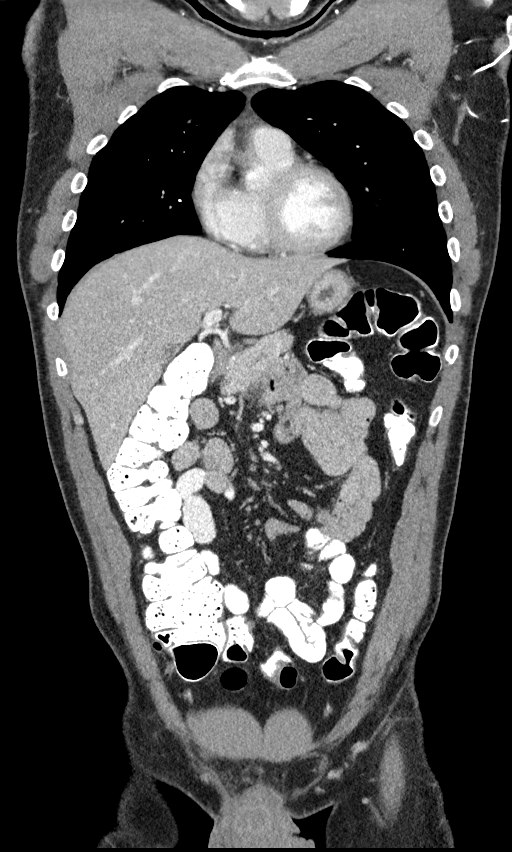
[im 84/151  soft-tissue]
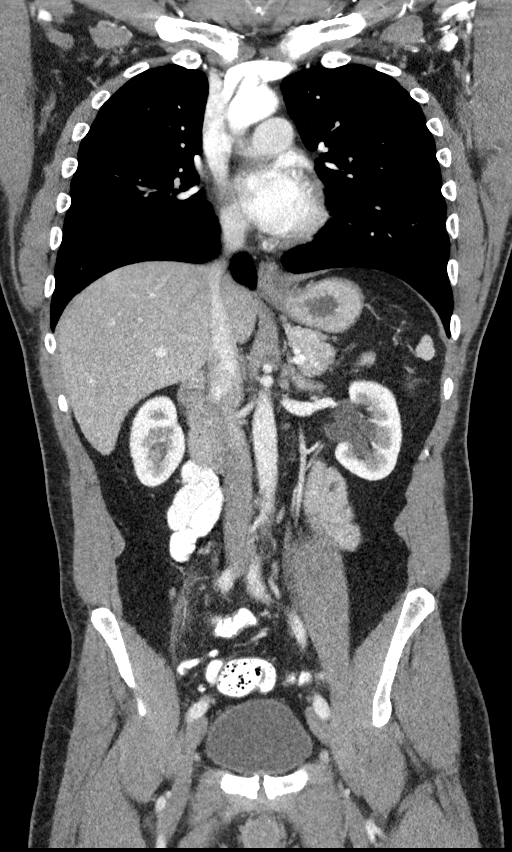

[14 of 46 positions shown; findings below may reference images not displayed]

FINDINGS: CT CHEST FINDINGS

Cardiovascular: No significant vascular findings. The heart size is
normal. There is no pericardial effusion.

Mediastinum/Nodes: There are no enlarged mediastinal, hilar or
axillary lymph nodes. The thyroid gland, trachea and esophagus
demonstrate no significant findings.

Lungs/Pleura: There is no pleural effusion. 9 x 4 mm perifissural
nodule along the left major fissure on image 65/4 is likely a benign
lymph node. There is also a tiny perifissural nodule along the minor
fissure. No suspicious pulmonary nodules.

Musculoskeletal/Chest wall: No chest wall mass or suspicious osseous
findings.

CT ABDOMEN AND PELVIS FINDINGS

Hepatobiliary: There are 2 small low-density liver lesions which are
nonspecific. Lesion anteriorly in the right lobe (segment 8)
measures 1.1 by 0.5 cm on image 45/3. Lesion in the dome of the left
lobe (segment 2) measures 6 mm on image 41/3. No evidence of
gallstones, gallbladder wall thickening or biliary dilatation.

Pancreas: Unremarkable. No pancreatic ductal dilatation or
surrounding inflammatory changes.

Spleen: Normal in size without focal abnormality.

Adrenals/Urinary Tract: Both adrenal glands appear normal. There are
renal sinus cysts bilaterally. No evidence of renal mass, ureteral
calculus or hydronephrosis. The bladder appears unremarkable.

Stomach/Bowel: Enteric contrast was administered and has passed to
the rectum. The stomach appears unremarkable for its degree of
distention. The small bowel, appendix and proximal colon appear
normal. There is a mass along the left lateral wall of the rectum
which measures approximately 3.3 x 1.9 cm transverse on image 106/3.
This extends approximately 4.1 cm in length on coronal image 115/6.
No other bowel lesions are identified. There is mild asymmetric
haziness in the left perirectal fat which is best seen on the
coronal images. No evidence of bowel obstruction.

Vascular/Lymphatic: There are no enlarged abdominal or pelvic lymph
nodes. There is a tiny left perirectal lymph node measuring 3 mm
short axis on image 112/6. No significant vascular findings.

Reproductive: The prostate gland and seminal vesicles appear normal.

Other: No ascites or peritoneal nodularity.  Intact abdominal wall.

Musculoskeletal: No acute or significant osseous findings.
IMPRESSION: 1. Known left rectal mass measures approximately 3.3 x 1.9 x 4.1 cm
and is associated with asymmetric haziness in the left perirectal
fat and small left perirectal lymph nodes.
2. No findings highly suspicious for distant metastatic disease.
There are 2 small indeterminate low-density hepatic lesions.
Consider MRI for further characterization.
3. No suspicious findings in the chest. Small perifissural nodules
bilaterally, likely benign.

## 2021-09-17 MED ORDER — IOHEXOL 350 MG/ML SOLN
80.0000 mL | Freq: Once | INTRAVENOUS | Status: AC | PRN
  Administered 2021-09-17: 80 mL via INTRAVENOUS

## 2021-09-17 NOTE — Telephone Encounter (Signed)
Order faxed.  Patient notified he will be contacted by [facility] to arrange MRI.  He thanked me for the call

## 2021-09-17 NOTE — Telephone Encounter (Signed)
Bryan [name] called from [facility] called to assist patient with scheduling the MRI via patients insurance. She stated [facility] is not in network with patients insurance. Asked if a copy of the order can be faxed over to their office fax: [phone]

## 2021-09-18 ENCOUNTER — Other Ambulatory Visit: Payer: Self-pay

## 2021-09-18 DIAGNOSIS — K6289 Other specified diseases of anus and rectum: Secondary | ICD-10-CM

## 2021-09-18 DIAGNOSIS — C189 Malignant neoplasm of colon, unspecified: Secondary | ICD-10-CM

## 2021-09-18 DIAGNOSIS — K625 Hemorrhage of anus and rectum: Secondary | ICD-10-CM

## 2021-09-18 NOTE — Telephone Encounter (Signed)
New orders faxed to [facility]

## 2021-09-18 NOTE — Telephone Encounter (Signed)
Alice from [facility] called to clarifiy orders. States MRI/MRV. States there is an MRI of the pelvis and MRV of the pelvis and would like to know which one the provider would like. States the patient is scheduled for an MRI of pelvis with and without contrast.   Best contact number [phone]

## 2021-09-18 NOTE — Progress Notes (Signed)
Pelvis

## 2021-09-18 NOTE — Addendum Note (Signed)
Addended by: Marlon Pel on: 09/18/2021 02:53 PM   Modules accepted: Orders

## 2021-09-23 ENCOUNTER — Ambulatory Visit: Admission: RE | Admit: 2021-09-23 | Payer: BC Managed Care – PPO | Source: Ambulatory Visit

## 2021-09-25 IMAGING — MR DG OUTSIDE FILMS BODY
12 of 26 series · 21 of 48 positions shown · IV contrast (Yes y)
Comparison: none

[Series 3: TOF · axial · 3.0mm · 1.95mm/px · 1 of 188 slices shown (1 of 3)]
[im 1/188]
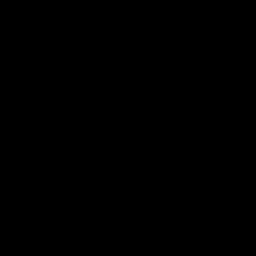

[Series 4: T2 · axial · 5.0mm · 0.70mm/px · 1 of 80 slices shown (1 of 2)]
[im 1/80]
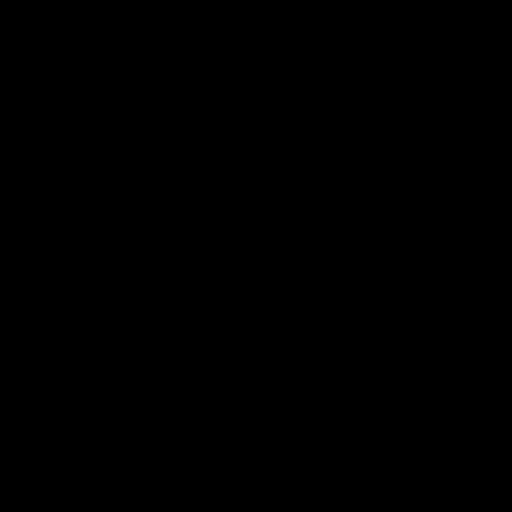

[Series 5: T2 · coronal · 5.0mm · 0.70mm/px · 1 of 80 slices shown (2 of 2)]
[im 1/80]
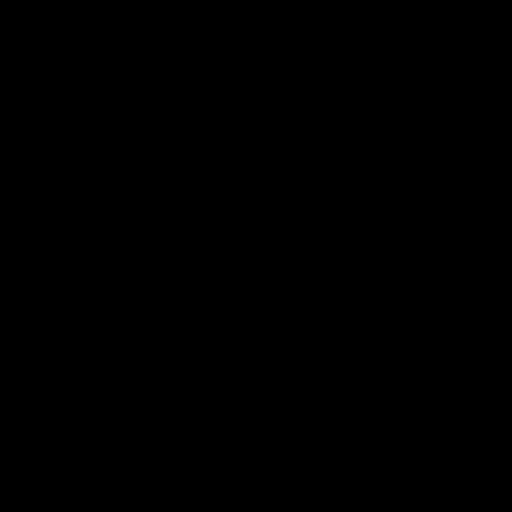

[Series 300: TOF · axial · 3.0mm · 1.95mm/px · 1 of 2 slices shown (2 of 3)]
[im 1/2]
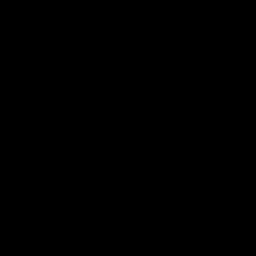

[Series 301: TOF · sagittal · 3.0mm · 1.95mm/px · 1 of 38 slices shown (3 of 3)]
[im 1/38]
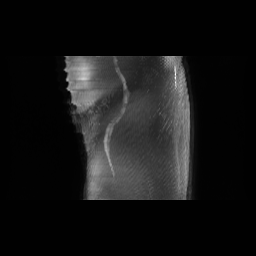

[Series 504: MRA · axial · non-contrast · 2.0mm · 0.80mm/px · 1 of 266 slices shown (1 of 2)]
[im 1/266]
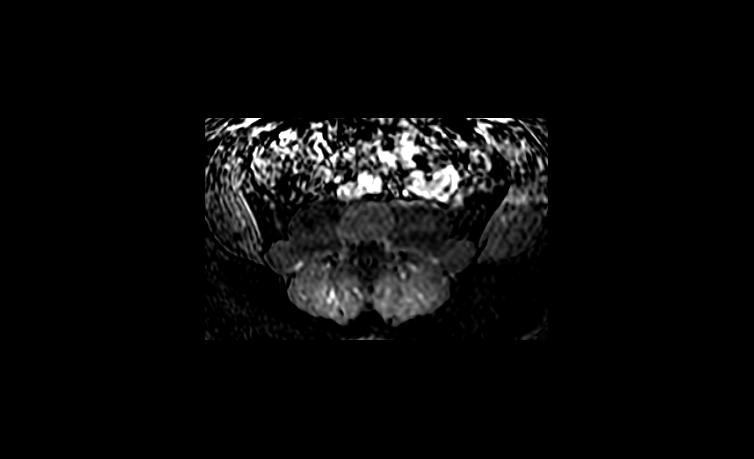

[Series 505: MRA · axial · non-contrast · 2.0mm · 0.80mm/px · 1 of 173 slices shown (2 of 2)]
[im 1/173]
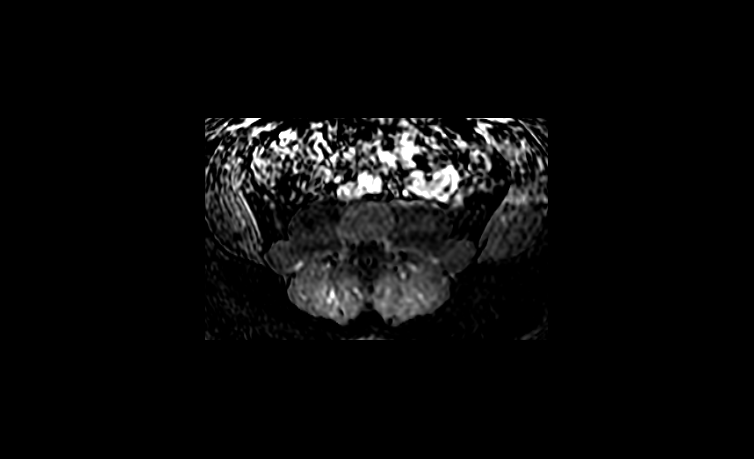

[((date))-((date)) · coronal · 2.0mm · 0.70mm/px · 3 of 353 slices shown (1 of 5)]
[im 1/353]
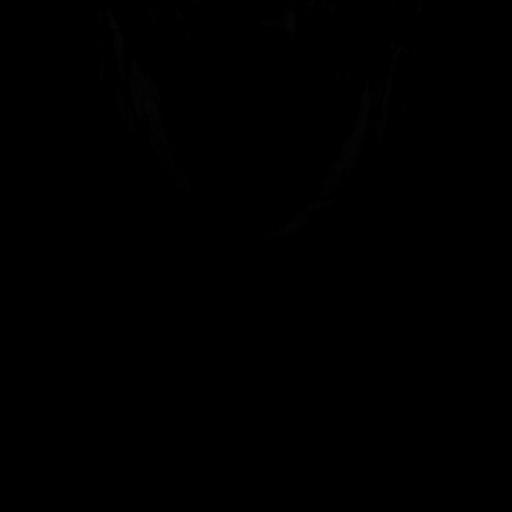
[im 177/353]
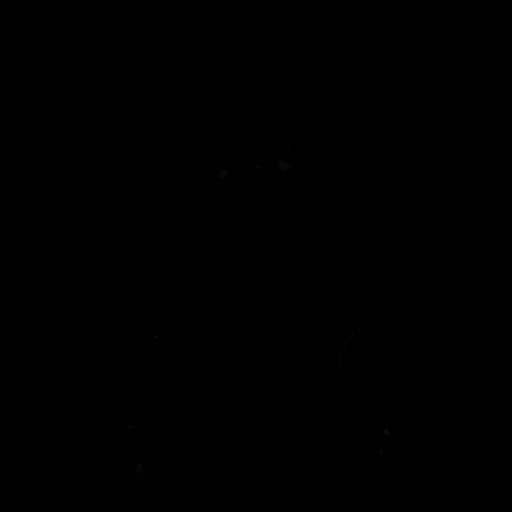
[im 353/353]
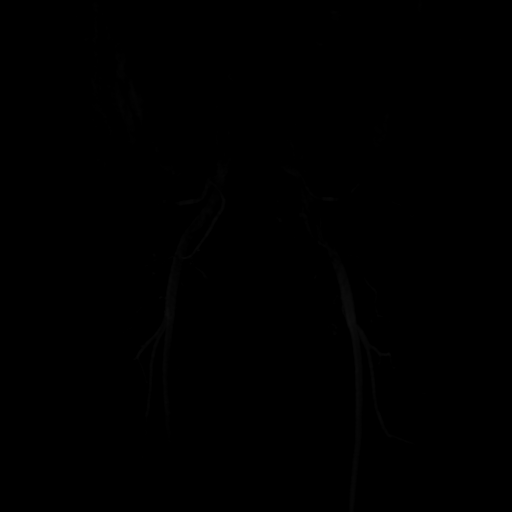

[((date))-((date)) · coronal · 2.0mm · 0.70mm/px · 3 of 357 slices shown (2 of 5)]
[im 1/357]
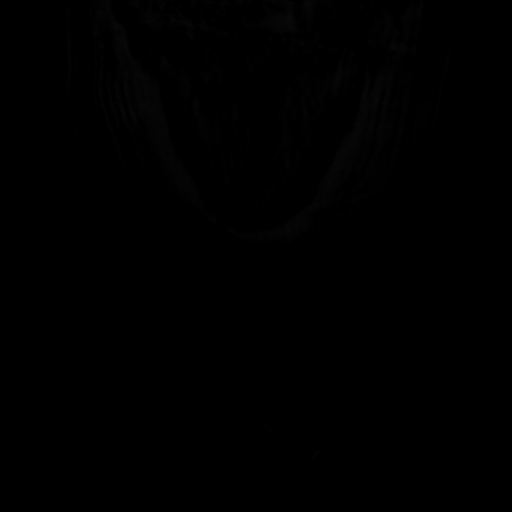
[im 179/357]
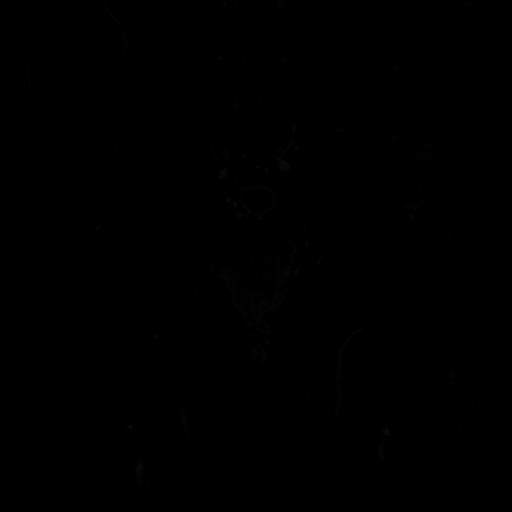
[im 357/357]
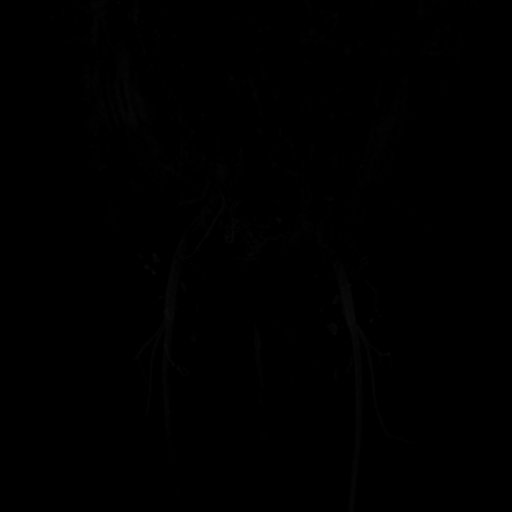

[((date))-((date)) · coronal · 2.0mm · 0.70mm/px · 3 of 358 slices shown (3 of 5)]
[im 1/358]
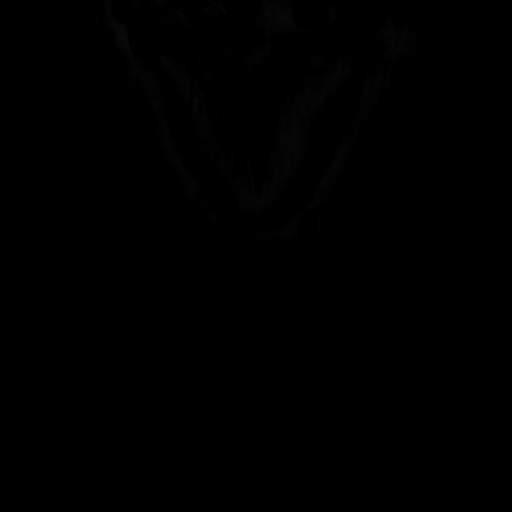
[im 179/358]
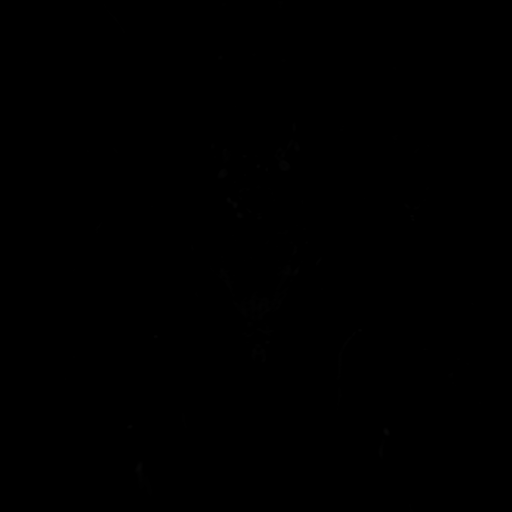
[im 358/358]
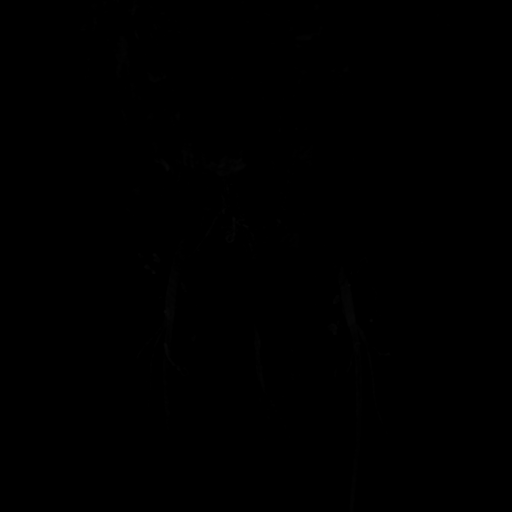

[((date))-((date)) · coronal · 2.0mm · 0.70mm/px · 3 of 357 slices shown (4 of 5)]
[im 1/357]
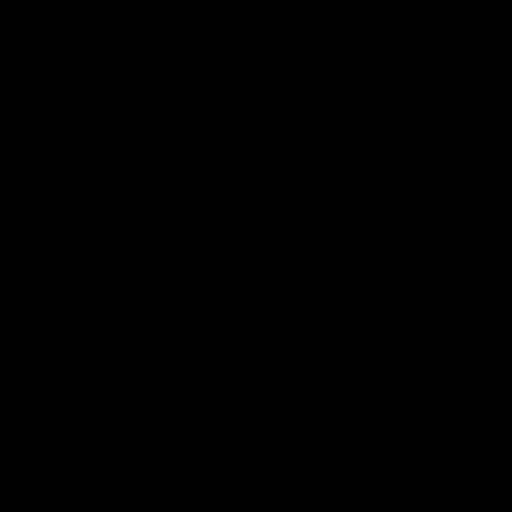
[im 179/357]
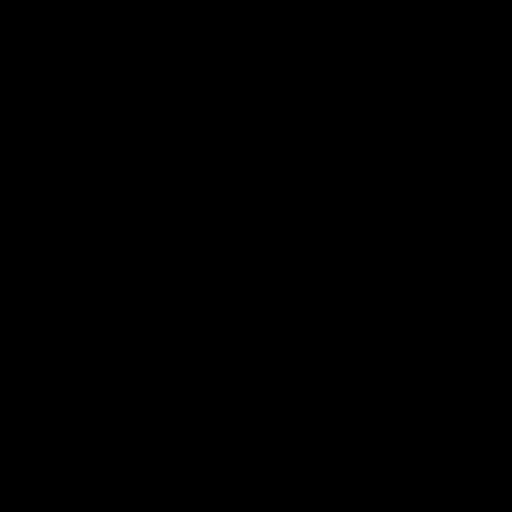
[im 357/357]
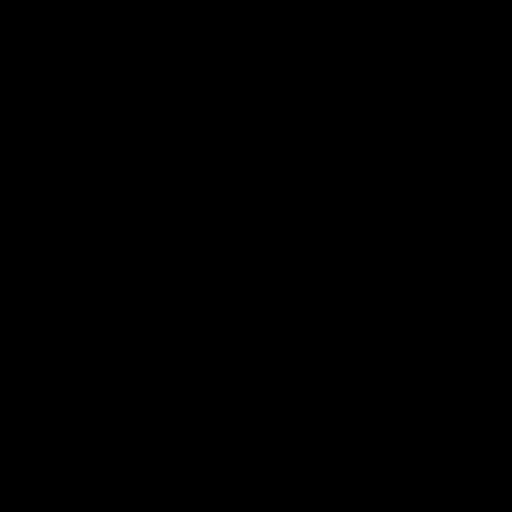

[((date))-((date)) · coronal · 2.0mm · 0.70mm/px · 2 of 358 slices shown (5 of 5)]
[im 1/358]
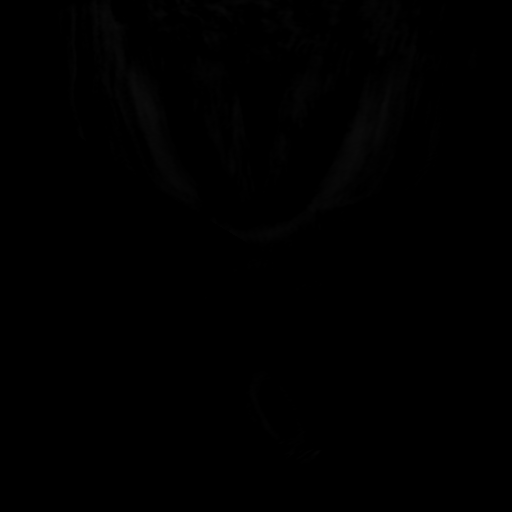
[im 179/358]
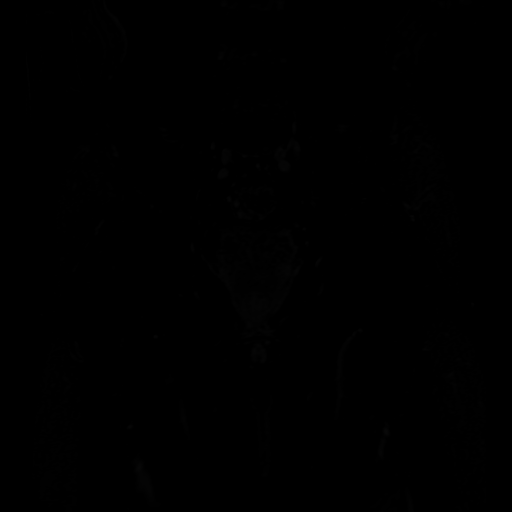

[21 of 48 positions shown; findings below may reference images not displayed]

:
REVIEW OF OUTSIDE IMAGING - NO PATIENT CHARGE

Today's Date: [DATE]

Patient Name: DELOWR

Requesting Provider: DELOWR

DELOWR was asked to review a OUTSIDE FILM BODY on the above named
patient.

Imaging studies reviewed:
MRI MRA of the pelvis dated [DATE] is submitted. The MRI will be
concentrated on with a rectal MRI template used as follows. Exam is
mildly motion degraded throughout.

My response is as follows:

TUMOR LOCATION

Tumor distance from Anal Verge/Skin Surface: Approximally 7 cm

Tumor distance from Internal Anal Sphincter: Is felt to contact the
sphincter including on 50/4.

TUMOR DESCRIPTION

Circumferential Extent: Approximately 180 degrees, from [DATE] to
[DATE] including on 46/4.

Tumor Length: 4.1 cm, including on [DATE]

T - CATEGORY

Extension through Muscularis Propria: Yes 1-5mm=T3b. Example at 5 mm
at the 5 o'clock position on 45/4.

Shortest Distance of any tumor/node from Mesorectal Fascia: 11

Extramural Vascular Invasion/Tumor Thrombus: No

Invasion of Anterior Peritoneal Reflection: No

Involvement of Adjacent Organs or Pelvic Sidewall: No

Levator Ani Involvement: equivocal. Contact with the levator on
50/4.

N - CATEGORY

Mesorectal Lymph Nodes >=5mm: 1-3=N1

## 2021-09-30 ENCOUNTER — Other Ambulatory Visit: Payer: Self-pay

## 2021-09-30 NOTE — Progress Notes (Signed)
The proposed treatment discussed in conference is for discussion purpose only and is not a binding recommendation.  The patients have not been physically examined, or presented with their treatment options.  Therefore, final treatment plans cannot be decided.  

## 2021-10-13 ENCOUNTER — Other Ambulatory Visit: Payer: Self-pay

## 2021-10-13 ENCOUNTER — Encounter: Payer: Self-pay | Admitting: Radiation Oncology

## 2021-10-13 ENCOUNTER — Ambulatory Visit
Admission: RE | Admit: 2021-10-13 | Discharge: 2021-10-13 | Disposition: A | Discharge status: Home / Self Care | Payer: Self-pay | Source: Ambulatory Visit | Attending: Radiation Oncology | Admitting: Radiation Oncology

## 2021-10-13 ENCOUNTER — Ambulatory Visit
Admission: RE | Admit: 2021-10-13 | Discharge: 2021-10-13 | Disposition: A | Discharge status: Home / Self Care | Payer: BC Managed Care – PPO | Source: Ambulatory Visit | Attending: Radiation Oncology | Admitting: Radiation Oncology

## 2021-10-13 VITALS — Ht 70.0 in | Wt 179.0 lb

## 2021-10-13 DIAGNOSIS — C20 Malignant neoplasm of rectum: Secondary | ICD-10-CM

## 2021-10-13 NOTE — Progress Notes (Signed)
Radiation Oncology         [phone] ________________________________  Initial Outpatient Consultation - Conducted via telephone due to current COVID-19 concerns for limiting patient exposure  I spoke with the patient to conduct this consult visit via telephone to spare the patient unnecessary potential exposure in the healthcare setting during the current COVID-19 pandemic. The patient was notified in advance and was offered a [place] meeting to allow for face to face communication but unfortunately reported that they did not have the appropriate resources/technology to support such a visit and instead preferred to proceed with a telephone consult.   Name: Bryan [name]        MRN: [id number]  Date of Service: 10/13/2021 DOB: 02/22/1971  CC:Tysinger, Camelia Eng, PA-C  Ileana Roup, MD     REFERRING PHYSICIAN: Ileana Roup, MD   DIAGNOSIS: The encounter diagnosis was Rectal cancer Bryan [name]).   HISTORY OF PRESENT ILLNESS: Bryan [name] is a 50 y.o. male seen at the request of Dr. Dema Severin for a diagnosis of rectal cancer.  The patient has been presented in GI oncology conference, he has decided to proceed with visits with doctors sequentially rather than continually as this has been worked up care, he presented originally with symptoms of hematochezia and rectal pain he met with Dr. Candis Schatz and on 09/14/2021 underwent colonoscopy digital rectal exam revealed a firm rectal mass 1 to 2 cm from the anal verge and was located prominently in the left bowel wall endoscopically it appeared ulcerated nonobstructing and friable in the distal rectum measuring 3 cm in length.  Biopsies were consistent with adenocarcinoma, and CT chest abdomen and pelvis on 09/15/2021 showed his known rectal mass measuring up to 4.1 cm with asymmetric haziness in the left perirectal fat and small left perirectal lymph nodes.  No evidence of distant metastatic disease is identified but some possible concerns were seen in the  liver.  He was offered an MRI of the pelvis.  Initially messages had been sent that he did not have full coverage of this and declined to proceed and other messages said that he was willing to pursue this.  When his case was discussed in conference consensus was to continue his work-up including an MRI of the pelvis as well as MRI of the liver.  Apparently he was able to have these performed at an outside facility his MRI of the pelvis was limited by the lack of rectal contrast for cancer staging.  Apparently though we do not have the records the radiologist felt that his cancer could not be adequately staged by TNM criteria, but he appeared to have localized disease along the left aspect of the rectum that was nonobstructing, and that while no nodal disease was specifically called out, haziness of the mesial rectum could not exclude adenopathy. He is contacted by phone to discuss next steps.     PREVIOUS RADIATION THERAPY: No   PAST MEDICAL HISTORY:  Past Medical History:  Diagnosis Date   Allergic rhinitis, cause unspecified    spring only   Hypercholesteremia    Rectal cancer ([place]) 09/14/2021       PAST SURGICAL HISTORY: Past Surgical History:  Procedure Laterality Date   None       FAMILY HISTORY:  Family History  Problem Relation Age of Onset   Cancer Mother        Breast   Alcohol abuse Father    Breast cancer Paternal Aunt    Diabetes Paternal Aunt  Heart disease Paternal Aunt    Breast cancer Cousin    Prostate cancer Paternal Great-grandfather    Hypertension Neg Hx    Colon polyps Neg Hx    Esophageal cancer Neg Hx    Rectal cancer Neg Hx    Stomach cancer Neg Hx      SOCIAL HISTORY:  reports that he has never smoked. He has never used smokeless tobacco. He reports that he does not drink alcohol and does not use drugs.  The patient is married and lives in [place].  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  He has 3 children 2  girls, and a boy.    ALLERGIES: Patient has no known allergies.   MEDICATIONS:  Current Outpatient Medications  Medication Sig Dispense Refill   ibuprofen (ADVIL) 800 MG tablet Take 1 tablet (800 mg total) by mouth 2 (two) times daily as needed for moderate pain. Take with food. Follow up with PCP for refills 30 tablet 1   hydrocortisone 2.5 % cream Apply topically 2 (two) times daily. (Patient not taking: No sig reported) 30 g 1   No current facility-administered medications for this encounter.     REVIEW OF SYSTEMS: On review of systems, the patient reports that he is doing okay.  He denies any unintended weight changes but had recently changed his diet and increased his physical activity so he had anticipated weight loss.  He has had more narrow caliber of his stool, and occasional blood seen within his stool as well as occasional discomfort with passing stool.  He denies any pelvic or abdominal pain, nausea, vomiting, fevers or chills.  No other complaints are verbalized.    PHYSICAL EXAM:  Wt Readings from Last 3 Encounters:  10/13/21 179 lb (81.2 kg)  09/14/21 180 lb (81.6 kg)  08/18/21 180 lb (81.6 kg)   Pain Assessment Pain Score: 4  Pain Loc: Rectum/10 Exam otherwise unable to assess due to encounter type.     ECOG = 1  0 - Asymptomatic (Fully active, able to carry on all predisease activities without restriction)  1 - Symptomatic but completely ambulatory (Restricted in physically strenuous activity but ambulatory and able to carry out work of a light or sedentary nature. For example, light housework, office work)  2 - Symptomatic, <50% in bed during the day (Ambulatory and capable of all self care but unable to carry out any work activities. Up and about more than 50% of waking hours)  3 - Symptomatic, >50% in bed, but not bedbound (Capable of only limited self-care, confined to bed or chair 50% or more of waking  hours)  4 - Bedbound (Completely disabled. Cannot carry on any self-care. Totally confined to bed or chair)  5 - Death   Eustace Pen MM, Creech RH, Tormey DC, et al. [phone]). "Toxicity and response criteria of the [name]". [place] Oncol. 5 (6): 649-55    LABORATORY DATA:  Lab Results  Component Value Date   WBC 5.6 08/18/2021   HGB 14.5 08/18/2021   HCT 44.0 08/18/2021   MCV 78.8 08/18/2021   PLT 309.0 08/18/2021   Lab Results  Component Value Date   NA 139 08/18/2021   K 4.0 08/18/2021   CL 102 08/18/2021   CO2 30 08/18/2021   Lab Results  Component Value Date   ALT 20 08/18/2021   AST 12 08/18/2021  ALKPHOS 97 08/18/2021   BILITOT 0.4 08/18/2021      RADIOGRAPHY: CT CHEST ABDOMEN PELVIS W CONTRAST  Result Date: 09/18/2021 CLINICAL DATA:  Newly diagnosed colon/rectal adenocarcinoma. Invasive adenocarcinoma. Staging. EXAM: CT CHEST, ABDOMEN, AND PELVIS WITH CONTRAST TECHNIQUE: Multidetector CT imaging of the chest, abdomen and pelvis was performed following the standard protocol during bolus administration of intravenous contrast. CONTRAST:  88m OMNIPAQUE IOHEXOL 350 MG/ML SOLN COMPARISON:  None. FINDINGS: CT CHEST FINDINGS Cardiovascular: No significant vascular findings. The heart size is normal. There is no pericardial effusion. Mediastinum/Nodes: There are no enlarged mediastinal, hilar or axillary lymph nodes. The thyroid gland, trachea and esophagus demonstrate no significant findings. Lungs/Pleura: There is no pleural effusion. 9 x 4 mm perifissural nodule along the left major fissure on image 65/4 is likely a benign lymph node. There is also a tiny perifissural nodule along the minor fissure. No suspicious pulmonary nodules. Musculoskeletal/Chest wall: No chest wall mass or suspicious osseous findings. CT ABDOMEN AND PELVIS FINDINGS Hepatobiliary: There are 2 small low-density liver lesions which are nonspecific. Lesion anteriorly in the right  lobe (segment 8) measures 1.1 by 0.5 cm on image 45/3. Lesion in the dome of the left lobe (segment 2) measures 6 mm on image 41/3. No evidence of gallstones, gallbladder wall thickening or biliary dilatation. Pancreas: Unremarkable. No pancreatic ductal dilatation or surrounding inflammatory changes. Spleen: Normal in size without focal abnormality. Adrenals/Urinary Tract: Both adrenal glands appear normal. There are renal sinus cysts bilaterally. No evidence of renal mass, ureteral calculus or hydronephrosis. The bladder appears unremarkable. Stomach/Bowel: Enteric contrast was administered and has passed to the rectum. The stomach appears unremarkable for its degree of distention. The small bowel, appendix and proximal colon appear normal. There is a mass along the left lateral wall of the rectum which measures approximately 3.3 x 1.9 cm transverse on image 106/3. This extends approximately 4.1 cm in length on coronal image 115/6. No other bowel lesions are identified. There is mild asymmetric haziness in the left perirectal fat which is best seen on the coronal images. No evidence of bowel obstruction. Vascular/Lymphatic: There are no enlarged abdominal or pelvic lymph nodes. There is a tiny left perirectal lymph node measuring 3 mm short axis on image 112/6. No significant vascular findings. Reproductive: The prostate gland and seminal vesicles appear normal. Other: No ascites or peritoneal nodularity.  Intact abdominal wall. Musculoskeletal: No acute or significant osseous findings. IMPRESSION: 1. Known left rectal mass measures approximately 3.3 x 1.9 x 4.1 cm and is associated with asymmetric haziness in the left perirectal fat and small left perirectal lymph nodes. 2. No findings highly suspicious for distant metastatic disease. There are 2 small indeterminate low-density hepatic lesions. Consider MRI for further characterization. 3. No suspicious findings in the chest. Small perifissural nodules  bilaterally, likely benign. Electronically Signed   By: WRichardean SaleM.D.   On: 09/18/2021 16:03       IMPRESSION/PLAN: 1. Adenocarcinoma of the distal rectum. Dr. MLisbeth Renshawrecommends completing his staging work up and we will likely have to review his imaging with radiology given how the procedure was limited by contrast, and not reviewed by a radiologist who specializes in rectal cancer staging.  We have requested his films to be power shared into the PACS system. If he has locally advanced disease he would benefit from neoadjuvant therapy either with total neoadjuvant chemotherapy upfront or chemoRT, then followed by surgery. He will be followed by Dr. WDema Severinin Colorectal Surgery and we will  keep him informed of treatment dates as is relates to later surgery.   We discussed the risks, benefits, short, and long term effects of radiotherapy, as well as the curative intent, and the patient is interested in proceeding. Dr. Lisbeth Renshaw discusses the delivery and logistics of radiotherapy and anticipates a course of 5 1/2 weeks of radiotherapy if he has locally advanced disease, and reviewed the possibile timing of therapy following total neoadjuvant therapy.  He will see Dr. Burr Medico tomorrow and we will continue multidisciplinary discussion of his case in order to take next steps for his treatment.   Given current concerns for patient exposure during the COVID-19 pandemic, this encounter was conducted via telephone.  The patient has provided two factor identification and has given verbal consent for this type of encounter and has been advised to only accept a meeting of this type in a secure network environment. The time spent during this encounter was 60 minutes including preparation, discussion, and coordination of the patient's care. The attendants for this meeting include Blenda Nicely, RN, Dr. Lisbeth Renshaw, Hayden Pedro  and [name] and his wife Daion Ginsberg  During the encounter,  Blenda Nicely, RN, Dr.  Lisbeth Renshaw, and Hayden Pedro were located at [name].  Yates Weisgerber was located at home with his wife Destyn Schuyler.    The above documentation reflects my direct findings during this shared patient visit. Please see the separate note by Dr. Lisbeth Renshaw on this date for the remainder of the patient's plan of care.    Carola Rhine, [facility]   **Disclaimer: This note was dictated with voice recognition software. Similar sounding words can inadvertently be transcribed and this note may contain transcription errors which may not have been corrected upon publication of note.**

## 2021-10-13 NOTE — Progress Notes (Signed)
GI Location of Tumor / Histology: Rectal Cancer  Bryan [name] presented with complaints of rectal bleeding and pain for about 2-3 months.  He reports left gluteal pain that radiates down his left leg.  MRI Pelvis: Done at [facility] 10/5- Records request pending.  CT CAP 09/17/2021: Known left rectal mass measures approximately 3.3 x 1.9 x 4.1 cm and is associated with asymmetric haziness in the left perirectal fat and small left perirectal lymph nodes.  No findings highly suspicious for distant metastatic disease.  There are 2 small indeterminate low-density hepatic lesions.  Consider MRI for further characterization.  Colonoscopy 09/14/2021:  Biopsies of Rectal Mass 09/14/2021    Past/Anticipated interventions by surgeon, if any:  Dr. Dema Severin 09/23/2021 --He will need an MRI pelvis, MRI liver given indeterminate nature on CT scan. Case is reviewed with one of our hepatobiliary surgeons radiographically - agreed MRI would add benefit -Medical and radiation oncology referrals -MDT presentation -Additional recommendations based upon MDT discussion as well as MRI results -Return for follow-up in 2 to 3 weeks or sooner if necessary  Past/Anticipated interventions by medical oncology, if any:    Weight changes, if any: Stable now.  He notes some weight loss prior to diagnosis but attributes that to dietary changes and increased activity.  Bowel/Bladder complaints, if any: Notes changes in stool form, says it is narrower.  Denies urinary changes.  Nausea / Vomiting, if any: none  Pain issues, if any: Notes occasional discomfort with bowel movements, notes worse when ibuprofen is wearing off.  Any blood per rectum: Notes he has occasional small amounts of blood in his stools.    SAFETY ISSUES: Prior radiation? No Pacemaker/ICD? No Possible current pregnancy? N/a Is the patient on methotrexate? No  Current Complaints/Details:

## 2021-10-14 ENCOUNTER — Telehealth: Payer: Self-pay | Admitting: Nurse Practitioner

## 2021-10-14 ENCOUNTER — Inpatient Hospital Stay: Payer: BC Managed Care – PPO

## 2021-10-14 ENCOUNTER — Ambulatory Visit: Payer: BC Managed Care – PPO | Admitting: Physician Assistant

## 2021-10-14 ENCOUNTER — Encounter: Payer: Self-pay | Admitting: Nurse Practitioner

## 2021-10-14 ENCOUNTER — Inpatient Hospital Stay: Payer: BC Managed Care – PPO | Attending: Nurse Practitioner | Admitting: Nurse Practitioner

## 2021-10-14 VITALS — BP 126/87 | HR 75 | Temp 97.2°F | Resp 18 | Wt 185.4 lb

## 2021-10-14 DIAGNOSIS — C20 Malignant neoplasm of rectum: Secondary | ICD-10-CM | POA: Diagnosis not present

## 2021-10-14 DIAGNOSIS — K6289 Other specified diseases of anus and rectum: Secondary | ICD-10-CM

## 2021-10-14 DIAGNOSIS — Z5111 Encounter for antineoplastic chemotherapy: Secondary | ICD-10-CM | POA: Diagnosis not present

## 2021-10-14 MED ORDER — IBUPROFEN 800 MG PO TABS: 800.0000 mg | ORAL_TABLET | Freq: Two times a day (BID) | ORAL | 1 refills | Status: DC | PRN

## 2021-10-14 NOTE — Progress Notes (Addendum)
[place]   [phone] Fax:([phone]) [place] Note   Patient Care Team: Tysinger, Camelia Eng, PA-C as PCP - General (Family Medicine) Ileana Roup, MD as Consulting Physician (General Surgery) Truitt Merle, MD as Consulting Physician (Hematology) Kyung Rudd, MD as Consulting Physician (Radiation Oncology) Alla Feeling, NP as Nurse Practitioner (Nurse Practitioner) Royston Bake, RN as Registered Nurse 10/14/2021  CHIEF COMPLAINTS/PURPOSE OF CONSULTATION:  Rectal cancer, referred by [facility] surgeon Dr. Nadeen Landau  SUMMARY OF ONCOLOGY HISTORY Oncology History  Rectal cancer ([place])  08/18/2021 Initial Diagnosis   Rectal cancer ([place])   09/14/2021 Procedure   Colonoscopy by Dr. Dustin Flock -The perianal examination was normal. - The digital rectal exam revealed a firm rectal mass palpated 1-2 cm from the anal verge. The mass was non-circumferential and located predominantly at the left bowel wall. - An ulcerated non-obstructing medium-sized friable mass was found in the distal rectum. The mass was non-circumferential. The mass measured three cm in length. No bleeding was present. Biopsies (5 passes total for 10 total biopsy specimens)were taken with a cold forceps for histology. Estimated blood loss was minimal. - The exam was otherwise normal throughout the examined colon. - The terminal ileum appeared normal. - No additional abnormalities were found on retroflexion   09/14/2021 Initial Biopsy   Diagnosis Rectum, biopsy, mass - INVASIVE ADENOCARCINOMA.  MMR normal  Baseline CEA 3.9     09/17/2021 Imaging   CT CAP IMPRESSION: 1. Known left rectal mass measures approximately 3.3 x 1.9 x 4.1 cm and is associated with asymmetric haziness in the left perirectal fat and small left perirectal lymph nodes. 2. No findings highly suspicious for distant metastatic disease. There are 2 small indeterminate low-density hepatic  lesions. Consider MRI for further characterization. 3. No suspicious findings in the chest. Small perifissural nodules bilaterally, likely benign.   09/25/2021 Imaging      Outside liver and pelvic MRI      HISTORY OF PRESENTING ILLNESS:  Bryan [name] 50 y.o. male with no significant past medical history is here because of newly diagnosed rectal cancer.  He initially presented to PCP Dr. Glade Lloyd 06/10/2021 with 1 month history of anal discomfort worse on sitting in the setting of history of bleeding hemorrhoids.  He was referred to GI and underwent colonoscopy on 09/14/2021 by Dr. Dustin Flock.  DRE revealed a firm mass palpable 1-2 cm from the anal verge, the mass was noncircumferential located at the left bowel wall and measured 3 cm in length.  No bleeding was present.  Pathology showed invasive adenocarcinoma with preserved expression of the major mismatch repair proteins.  Baseline CEA was 3.9.  Staging CT CAP 09/17/2021 showed a 3.3 x 1.9 cm mass along the left lateral wall of the rectum extending approximately 4.1 cm in length with asymmetric haziness in the left perirectal fat and tiny left perirectal lymph nodes measuring 3 mm.  Additionally there are 2 small low-density liver lesions, one anteriorly in the right lobe measuring 1.1 x 0.5 cm and the other in the dome of the left lobe measuring 6 mm, these are indeterminate.  No other definitive evidence of distant metastasis.  He underwent dedicated liver MRI 09/25/2021 which showed a 7 mm T2 high signal lesion in the right lobe too small to characterize but with findings consistent with hemangioma; no additional lesions were noted.  MRI of the pelvis was done 09/30/2021 for local staging, unfortunately the outside imaging center did  not use TNM protocol, findings show left greater than right rectal wall thickening, the left border is indistinct with mild hazy appearance of the adjacent mesorectum concerning for tumor extension, there was a mildly  prominent inguinal lymph node measuring less than 1 cm, otherwise negative for rectal adenopathy.  He was seen by colorectal surgeon Dr. Dema Severin and referred to medical and radiation oncology, seen by Dr. Lisbeth Renshaw on 10/13/2021.  He was referred to medical oncology to discuss chemotherapy options.  Socially, he is married with 3 healthy children ages 20, 23, and 38.  He works night shift for [employer].  He is independent with ADLs.  Denies alcohol use, tobacco, or other drug use.  He has no prior colonoscopy.  Family history is significant for mother with breast cancer in her 64s, paternal aunt with breast cancer, maternal cousins with breast cancer, and a maternal grandfather with prostate cancer.  Today he presents with his wife, he feels well in general.  Stools are small caliber with intermittent small bleeding.  He has anal/rectal pain that is dull and nagging, currently a 4 out of 10, worse with sitting or lying.  He was taking MiraLAX but stopped, was recommended to restart when he saw Dr. Dema Severin today.  Denies decreased appetite, weight loss, fatigue, recent infection, cough, chest pain, dyspnea, abdominal pain, any other concerns.    MEDICAL HISTORY:  Past Medical History:  Diagnosis Date   Allergic rhinitis, cause unspecified    spring only   Hypercholesteremia    Rectal cancer ([place]) 09/14/2021    SURGICAL HISTORY: Past Surgical History:  Procedure Laterality Date   None      SOCIAL HISTORY: Social History   Socioeconomic History   Marital status: Married    Spouse name: Not on file   Number of children: 3   Years of education: Not on file   Highest education level: Not on file  Occupational History   Occupation: Driver/yard work    Fish farm manager: [employer]    Comment: nights  Tobacco Use   Smoking status: Never   Smokeless tobacco: Never  Vaping Use   Vaping Use: Never used  Substance and Sexual Activity   Alcohol use: No   Drug use: No   Sexual activity: Not on file  Other Topics  Concern   Not on file  Social History Narrative   3 children--ages 14, 19, 21 (as of 10/14/21)   Social Determinants of Health   Financial Resource Strain: Not on file  Food Insecurity: Not on file  Transportation Needs: Not on file  Physical Activity: Not on file  Stress: Not on file  Social Connections: Not on file  Intimate Partner Violence: Not At Risk   Fear of Current or Ex-Partner: No   Emotionally Abused: No   Physically Abused: No   Sexually Abused: No    FAMILY HISTORY: Family History  Problem Relation Age of Onset   Cancer Mother 92       Breast   Alcohol abuse Father    Cancer Paternal Aunt        breast   Breast cancer Paternal Aunt    Diabetes Paternal Aunt    Heart disease Paternal Aunt    Breast cancer Cousin    Cancer Paternal Great-grandfather        prostate   Prostate cancer Paternal Great-grandfather    Hypertension Neg Hx    Colon polyps Neg Hx    Esophageal cancer Neg Hx    Rectal cancer  Neg Hx    Stomach cancer Neg Hx     ALLERGIES:  has No Known Allergies.  MEDICATIONS:  Current Outpatient Medications  Medication Sig Dispense Refill   hydrocortisone 2.5 % cream Apply topically 2 (two) times daily. (Patient not taking: No sig reported) 30 g 1   ibuprofen (ADVIL) 800 MG tablet Take 1 tablet (800 mg total) by mouth 2 (two) times daily as needed for moderate pain. Take with food. Follow up with PCP for refills 30 tablet 1   No current facility-administered medications for this visit.    REVIEW OF SYSTEMS:   Constitutional: Denies fevers, chills or abnormal night sweats Eyes: Denies blurriness of vision, double vision or watery eyes Ears, nose, mouth, throat, and face: Denies mucositis or sore throat Respiratory: Denies cough, dyspnea or wheezes Cardiovascular: Denies palpitation, chest discomfort or lower extremity swelling Gastrointestinal:  Denies nausea, vomiting, constipation, diarrhea, heartburn or change in bowel habits (+)  anorectal pain (+) small caliber stools (+) intermittent bleeding Skin: Denies abnormal skin rashes Lymphatics: Denies new lymphadenopathy or easy bruising Neurological:Denies numbness, tingling or new weaknesses Behavioral/Psych: Mood is stable, no new changes  All other systems were reviewed with the patient and are negative.  PHYSICAL EXAMINATION: ECOG PERFORMANCE STATUS: 0 - Asymptomatic  Vitals:   10/14/21 1131  BP: 126/87  Pulse: 75  Resp: 18  Temp: (!) 97.2 F (36.2 C)  SpO2: 99%   Filed Weights   10/14/21 1131  Weight: 185 lb 7 oz (84.1 kg)    GENERAL:alert, no distress and comfortable SKIN: No rash EYES: sclera clear NECK: Without mass LYMPH:  no palpable cervical, supraclavicular, or inguinal lymphadenopathy LUNGS: clear with normal breathing effort HEART: regular rate & rhythm, no lower extremity edema ABDOMEN:abdomen soft, non-tender and normal bowel sounds Musculoskeletal:no cyanosis of digits and no clubbing  PSYCH: alert & oriented x 3 with fluent speech NEURO: no focal motor/sensory deficits Rectal exam: External exam is unremarkable.  Base of the tumor on the left lateral rectal wall is palpable at approximately 6 cm, normal sphincter tone.  No blood on glove. Rectal exam chaperoned by Dr. Truitt Merle  LABORATORY DATA:  I have reviewed the data as listed CBC Latest Ref Rng & Units 08/18/2021  WBC 4.0 - 10.5 K/uL 5.6  Hemoglobin 13.0 - 17.0 g/dL 14.5  Hematocrit 39.0 - 52.0 % 44.0  Platelets 150.0 - 400.0 K/uL 309.0   CMP Latest Ref Rng & Units 08/18/2021 10/19/2011  Glucose 70 - 99 mg/dL 79 103(H)  BUN 6 - 23 mg/dL 11 -  Creatinine 0.40 - 1.50 mg/dL 1.15 -  Sodium 135 - 145 mEq/L 139 -  Potassium 3.5 - 5.1 mEq/L 4.0 -  Chloride 96 - 112 mEq/L 102 -  CO2 19 - 32 mEq/L 30 -  Calcium 8.4 - 10.5 mg/dL 9.9 -  Total Protein 6.0 - 8.3 g/dL 7.3 -  Total Bilirubin 0.2 - 1.2 mg/dL 0.4 -  Alkaline Phos 39 - 117 U/L 97 -  AST 0 - 37 U/L 12 -  ALT 0 - 53 U/L  20 -     CMP Latest Ref Rng & Units 08/18/2021 10/19/2011  Glucose 70 - 99 mg/dL 79 103(H)  BUN 6 - 23 mg/dL 11 -  Creatinine 0.40 - 1.50 mg/dL 1.15 -  Sodium 135 - 145 mEq/L 139 -  Potassium 3.5 - 5.1 mEq/L 4.0 -  Chloride 96 - 112 mEq/L 102 -  CO2 19 - 32 mEq/L 30 -  Calcium 8.4 - 10.5 mg/dL 9.9 -  Total Protein 6.0 - 8.3 g/dL 7.3 -  Total Bilirubin 0.2 - 1.2 mg/dL 0.4 -  Alkaline Phos 39 - 117 U/L 97 -  AST 0 - 37 U/L 12 -  ALT 0 - 53 U/L 20 -      RADIOGRAPHIC STUDIES: I have personally reviewed the radiological images as listed and agreed with the findings in the report. CT CHEST ABDOMEN PELVIS W CONTRAST  Result Date: 09/18/2021 CLINICAL DATA:  Newly diagnosed colon/rectal adenocarcinoma. Invasive adenocarcinoma. Staging. EXAM: CT CHEST, ABDOMEN, AND PELVIS WITH CONTRAST TECHNIQUE: Multidetector CT imaging of the chest, abdomen and pelvis was performed following the standard protocol during bolus administration of intravenous contrast. CONTRAST:  30mL OMNIPAQUE IOHEXOL 350 MG/ML SOLN COMPARISON:  None. FINDINGS: CT CHEST FINDINGS Cardiovascular: No significant vascular findings. The heart size is normal. There is no pericardial effusion. Mediastinum/Nodes: There are no enlarged mediastinal, hilar or axillary lymph nodes. The thyroid gland, trachea and esophagus demonstrate no significant findings. Lungs/Pleura: There is no pleural effusion. 9 x 4 mm perifissural nodule along the left major fissure on image 65/4 is likely a benign lymph node. There is also a tiny perifissural nodule along the minor fissure. No suspicious pulmonary nodules. Musculoskeletal/Chest wall: No chest wall mass or suspicious osseous findings. CT ABDOMEN AND PELVIS FINDINGS Hepatobiliary: There are 2 small low-density liver lesions which are nonspecific. Lesion anteriorly in the right lobe (segment 8) measures 1.1 by 0.5 cm on image 45/3. Lesion in the dome of the left lobe (segment 2) measures 6 mm on image 41/3.  No evidence of gallstones, gallbladder wall thickening or biliary dilatation. Pancreas: Unremarkable. No pancreatic ductal dilatation or surrounding inflammatory changes. Spleen: Normal in size without focal abnormality. Adrenals/Urinary Tract: Both adrenal glands appear normal. There are renal sinus cysts bilaterally. No evidence of renal mass, ureteral calculus or hydronephrosis. The bladder appears unremarkable. Stomach/Bowel: Enteric contrast was administered and has passed to the rectum. The stomach appears unremarkable for its degree of distention. The small bowel, appendix and proximal colon appear normal. There is a mass along the left lateral wall of the rectum which measures approximately 3.3 x 1.9 cm transverse on image 106/3. This extends approximately 4.1 cm in length on coronal image 115/6. No other bowel lesions are identified. There is mild asymmetric haziness in the left perirectal fat which is best seen on the coronal images. No evidence of bowel obstruction. Vascular/Lymphatic: There are no enlarged abdominal or pelvic lymph nodes. There is a tiny left perirectal lymph node measuring 3 mm short axis on image 112/6. No significant vascular findings. Reproductive: The prostate gland and seminal vesicles appear normal. Other: No ascites or peritoneal nodularity.  Intact abdominal wall. Musculoskeletal: No acute or significant osseous findings. IMPRESSION: 1. Known left rectal mass measures approximately 3.3 x 1.9 x 4.1 cm and is associated with asymmetric haziness in the left perirectal fat and small left perirectal lymph nodes. 2. No findings highly suspicious for distant metastatic disease. There are 2 small indeterminate low-density hepatic lesions. Consider MRI for further characterization. 3. No suspicious findings in the chest. Small perifissural nodules bilaterally, likely benign. Electronically Signed   By: Richardean Sale M.D.   On: 09/18/2021 16:03    ASSESSMENT & PLAN: 50 year old male  with no significant past medical history  Adenocarcinoma of the rectum, MMR normal, Tx Nx M0 -We reviewed his work-up including colonoscopy, CT, and MRI in detail -He presented with anorectal pain and intermittent bleeding,  work-up showed a 4 cm mass in the distal rectum, path confirmed invasive adenocarcinoma.  Staging work-up is concerning for extension into the mesorectum and possible local lymphadenopathy.  Unfortunately the staging pelvic MRI was not done with TNM protocol.  I have asked our radiologist to interpret to determine the stage.  If they cannot, patient is agreeable to repeat pelvic MRI for local staging -Small liver lesions are compatible with hemangioma, otherwise negative for distant metastasis.  Baseline CEA normal 3.9 -We discussed treatment options for local/locally advanced rectal cancer -We discussed if he has T1 or T2 disease, or develops signs of obstruction or severe bleeding, he will proceed with upfront surgery -If pelvic MRI shows T3N0, we would recommend concurrent chemoradiation with oral Xeloda (BID on M-F with radiation) for 5-6 weeks, followed by surgery, followed by adjuvant chemo with either Xeloda or FOLFOX depending on final pathology. -If pelvic MRI shows T4 or any N we would recommend TNT consisting of 4 months of FOLFOX (q2 weeks x8 cycles) or 6 cycles of Capox (oxaliplatin on day 1 plus xeloda BID x2 weeks on/1 week off q21 days) followed by concurrent chemoradiation with Xeloda, followed by surgery.  He would likely not need adjuvant chemo in this scenario -We reviewed potential benefit, side effects, symptom management, and dosing schedules for the above scenarios. We discussed a Port-A-Cath placement if necessary.  We will refer him for chemo education class if he requires TNT -Bryan [name] is otherwise healthy with no significant past medical history and good performance status, he would be a good candidate for chemotherapy. -Bryan [name] appears stable.  He has  mild anorectal pain managed with ibuprofen 800 mg, I refilled.  Stools are small caliber, he will restart MiraLAX to avoid constipation.   -He will do baseline lab today (CBC, CMP, CEA, ferritin/iron)  -We will follow-up on the staging pelvic MRI and call patient with results to finalize the treatment plan -Follow-up with first treatment -Patient seen with Dr. Mosetta Putt  2. Anorectal pain and bleeding -secondary to #1 -normal CBC on 08/18/21, repeat today -managed with ibuprofen 800 mg PRN -restart miralax to avoid constipation  Genetics -Patient has strong family history of breast cancer and one relative with prostate cancer.  - Given his personal and family history of cancer he qualifies for genetic testing and is interested.   -He has 3 children who are aware of his diagnosis and their potential risks -his rectal tumor is MMR normal, likely not Lynch syndrome -Referral placed today  3.  Goals of care -We discussed the goal of neoadjuvant chemo is curative  PLAN: -Work up reviewed -Staging pelvic MRI - either our radiologist will interpret for TNM from outside facility or repeat for local staging -Will call with MRI results to finalize treatment plan -Chemo class if he requires TNT -Dr. Cliffton Asters to place [facility] if needed -Referral to genetics -Lab today -F/up with first treatment   Orders Placed This Encounter  Procedures   CBC with Differential (Cancer Center Only)    Standing Status:   Standing    Number of Occurrences:   50    Standing Expiration Date:   10/14/2022   CMP (Cancer Center only)    Standing Status:   Standing    Number of Occurrences:   50    Standing Expiration Date:   10/14/2022   CEA (IN HOUSE-[facility])FOR [facility] ONLY    Standing Status:   Standing    Number of Occurrences:   50  Standing Expiration Date:   10/14/2022   Ferritin    Standing Status:   Standing    Number of Occurrences:   50    Standing Expiration Date:   10/14/2022   Iron and TIBC     Standing Status:   Standing    Number of Occurrences:   50    Standing Expiration Date:   10/14/2022   Ambulatory referral to Genetics    Referral Priority:   Routine    Referral Type:   Consultation    Referral Reason:   Specialty Services Required    Number of Visits Requested:   1     All questions were answered. The patient knows to call the clinic with any problems, questions or concerns.     Alla Feeling, NP 10/14/2021    Addendum  I have seen the patient, examined him. I agree with the assessment and and plan and have edited the notes.   50 yo male with no significant past medical history presented with rectal bleeding.  Colonoscopy showed a 4 cm mass in the distal rectum, biopsy confirmed adenocarcinoma, MMR intact.  Staging scan showed no evidence of distant metastasis.  His staging abdominal and pelvic MRI was done outside, and the report did not report staging information. We have uploaded the MRI images to our system, and I have requested our radiologist Dr. Jobe Igo to review the scan, to see if we need to repeat pelvic MRI for rectal cancer protocol.  If he has T3N0 disease I will recommend neoadjuvant concurrent chemotherapy and radiation. If he has T4 or positive node, then I will recommend total neoadjuvant therapy.  I reviewed the details with patient and his wife.  I will call him after his MRI was read by our radiologist, to finalize his treatment plan.   Truitt Merle  10/14/2021  Addendum  Our radiologist Dr. Jobe Igo read his outside MRI and stage his tumor as T3N1b. I have request a formal reading for documentation purpose. I called Bryan [name] and his wife and discussion my recommend for total neoadjuvant chemo and radiation. He opted CAPOX. I reviewed the potential side effects with them in detail. He voiced good understanding and agrees to proceed. I will schedule his first cycle CAPOX in 1-2 weeks. Will schedule his chemo class.   Truitt Merle  10/16/2021

## 2021-10-14 NOTE — Telephone Encounter (Signed)
Scheduled per sch msg. Called and spoke with patient. Confirmed appt  

## 2021-10-14 NOTE — Progress Notes (Signed)
I met with Mr and Mrs Lenker after his consultation with Cira Rue, NP and Dr Burr Medico.  I explained my role as nurse navigator and provided my contact information.  I provided information regarding our FMLA form process, alight grant money, chemo education class, and [facility]ight integrated care services.  I briefly reviewed placement and use of port a cath and showed them a sample.  All questions were answered.  They verbalized understanding.

## 2021-10-16 ENCOUNTER — Other Ambulatory Visit: Payer: Self-pay | Admitting: Hematology

## 2021-10-16 ENCOUNTER — Telehealth: Payer: Self-pay

## 2021-10-16 ENCOUNTER — Telehealth: Payer: Self-pay | Admitting: Pharmacist

## 2021-10-16 ENCOUNTER — Telehealth: Payer: Self-pay | Admitting: *Deleted

## 2021-10-16 ENCOUNTER — Other Ambulatory Visit (HOSPITAL_COMMUNITY): Payer: Self-pay

## 2021-10-16 DIAGNOSIS — C20 Malignant neoplasm of rectum: Secondary | ICD-10-CM

## 2021-10-16 MED ORDER — CAPECITABINE 500 MG PO TABS
ORAL_TABLET | ORAL | 0 refills | Status: DC
  Filled 2021-10-16: qty 98, fill #0

## 2021-10-16 MED ORDER — CAPECITABINE 500 MG PO TABS
ORAL_TABLET | ORAL | 0 refills | Status: DC
Start: 2021-10-16 — End: 2021-10-16

## 2021-10-16 MED ORDER — ONDANSETRON HCL 8 MG PO TABS: 8.0000 mg | ORAL_TABLET | Freq: Two times a day (BID) | ORAL | 1 refills | Status: DC | PRN

## 2021-10-16 MED ORDER — PROCHLORPERAZINE MALEATE 10 MG PO TABS: 10.0000 mg | ORAL_TABLET | Freq: Four times a day (QID) | ORAL | 1 refills | Status: DC | PRN

## 2021-10-16 NOTE — Progress Notes (Signed)
START ON PATHWAY REGIMEN - Colorectal     A cycle is every 21 days:     Capecitabine      Oxaliplatin   **Always confirm dose/schedule in your pharmacy ordering system**  Patient Characteristics: Preoperative or Nonsurgical Candidate (Clinical Staging), Rectal, cT3 - cT4, cN0 or Any cT, cN+ Tumor Location: Rectal Therapeutic Status: Preoperative or Nonsurgical Candidate (Clinical Staging) AJCC T Category: cT3 AJCC N Category: cN1b AJCC M Category: cM0 AJCC 8 Stage Grouping: IIIB Intent of Therapy: Curative Intent, Discussed with Patient 

## 2021-10-16 NOTE — Telephone Encounter (Signed)
[facility] called to retrieve imaging for pt. Fax numbers were not working in [employer] office to be faxed to their department. Left pt information in envelope at front desk for [facility] to pick up. Also, left vm on Holly's personal office number that information is at desk for pick up.

## 2021-10-16 NOTE — Telephone Encounter (Signed)
Oral Oncology Patient Advocate Encounter   Received notification from [place] that prior authorization for Xeloda is required.   PA submitted on CoverMyMeds Key [id number] Status is pending   Oral Oncology Clinic will continue to follow.   [place] Patient [place] Phone [phone] Fax [phone] 10/16/2021 2:15 PM

## 2021-10-16 NOTE — Addendum Note (Signed)
Addended by: Truitt Merle on: 10/16/2021 01:53 PM   Modules accepted: Orders

## 2021-10-16 NOTE — Telephone Encounter (Signed)
Oral Oncology Pharmacist Encounter  Received new prescription for Xeloda (capecitabine) for the neoadjuvant treatment of rectal cancer in conjunction with oxaliplatin, planned duration 6 cycles.  CBC and CMP from 08/18/21 assessed, no relevant lab abnormalities noted. Prescription dose and frequency assessed for appropriateness. Appropriate for therapy initiation.   Current medication list in Epic reviewed, no relevant/significant DDIs with Xeloda identified.  Evaluated chart and no patient barriers to medication adherence noted.   Patient agreement for treatment documented in MD Addendum on 10/16/21.  Prescription has been e-scribed to the [facility] for benefits analysis and approval.  Oral Oncology Clinic will continue to follow for insurance authorization, copayment issues, initial counseling and start date.  Leron Croak, PharmD, BCPS Hematology/Oncology Clinical Pharmacist [facility] [place] [phone] 10/16/2021 2:07 PM

## 2021-10-19 ENCOUNTER — Other Ambulatory Visit (HOSPITAL_COMMUNITY): Payer: Self-pay

## 2021-10-19 ENCOUNTER — Other Ambulatory Visit: Payer: Self-pay

## 2021-10-19 MED ORDER — CAPECITABINE 500 MG PO TABS: ORAL_TABLET | ORAL | 0 refills | Status: DC

## 2021-10-19 NOTE — Telephone Encounter (Signed)
Oral Oncology Patient Advocate Encounter  Prior Authorization for Xeloda has been approved.    PA# [id number] Effective dates: 10/16/21 through 10/16/22  Patient must fill at [facility] will continue to follow.   [facility] [facility] [place] Phone [phone] Fax [phone] 10/19/2021 10:14 AM

## 2021-10-19 NOTE — Telephone Encounter (Signed)
Oral Chemotherapy Pharmacist Encounter  I spoke with patient for overview of: Xeloda (capecitabine) for the neoadjuvant treatment of rectal cancer in conjunction with infusional oxaliplatin, planned duration of 6 cycles.  Counseled patient on administration, dosing, side effects, monitoring, drug-food interactions, safe handling, storage, and disposal.  Patient will take Xeloda 500mg  tablets, 3 tablets (1500mg ) by mouth in AM and 4 tabs (2000mg ) by mouth in PM, within 30 minutes of finishing meals, on days 1-14 of each 21 day cycle.   Oxaliplatin will be infused on day 1 of each 21 day cycle.  Xeloda and oxaliplatin start date: tentatively 10/23/21 - patient knows not to start Xeloda until first day of oxaliplatin infusion.   Adverse effects include but are not limited to: fatigue, decreased blood counts, GI upset, diarrhea, mouth sores, and hand-foot syndrome.  Patient has anti-emetic on hand and knows to take it if nausea develops.   Patient will obtain anti diarrheal and alert the office of 4 or more loose stools above baseline.  Reviewed with patient importance of keeping a medication schedule and plan for any missed doses. No barriers to medication adherence identified.  Medication reconciliation performed and medication/allergy list updated.  Insurance authorization for Xeloda has been obtained. Test claim at the pharmacy revealed copayment $5 for 1st fill of Xeloda. Patient is required to fill through [facility]. Phone number provided to patient to set up shipment ([phone]). Patient stated he will call today to set up medication shipment to his home.   All questions answered.  Mr. Seliga voiced understanding and appreciation.   Medication education handout placed in mail for patient. Patient knows to call the office with questions or concerns. Oral Chemotherapy Clinic phone number provided to patient.   Leron Croak, PharmD, BCPS Hematology/Oncology  Clinical Pharmacist [facility] [place] [phone] 10/19/2021 11:18 AM

## 2021-10-21 ENCOUNTER — Telehealth: Payer: Self-pay

## 2021-10-21 ENCOUNTER — Inpatient Hospital Stay: Payer: BC Managed Care – PPO

## 2021-10-21 NOTE — Telephone Encounter (Signed)
Message sent to scheduling for appt on 10/23/2021 prior to infusion with Dr. Burr Medico

## 2021-10-22 MED FILL — Dexamethasone Sodium Phosphate Inj 100 MG/10ML: INTRAMUSCULAR | Qty: 1 | Status: AC

## 2021-10-23 ENCOUNTER — Inpatient Hospital Stay: Payer: BC Managed Care – PPO

## 2021-10-23 ENCOUNTER — Other Ambulatory Visit: Payer: Self-pay

## 2021-10-23 ENCOUNTER — Encounter: Payer: Self-pay | Admitting: Hematology

## 2021-10-23 ENCOUNTER — Inpatient Hospital Stay (HOSPITAL_BASED_OUTPATIENT_CLINIC_OR_DEPARTMENT_OTHER): Payer: BC Managed Care – PPO | Admitting: Hematology

## 2021-10-23 VITALS — BP 128/88 | HR 76 | Temp 98.4°F | Resp 18 | Ht 70.0 in | Wt 186.9 lb

## 2021-10-23 DIAGNOSIS — Z5111 Encounter for antineoplastic chemotherapy: Secondary | ICD-10-CM | POA: Diagnosis not present

## 2021-10-23 DIAGNOSIS — C20 Malignant neoplasm of rectum: Secondary | ICD-10-CM

## 2021-10-23 LAB — CMP (CANCER CENTER ONLY)
ALT: 43 U/L (ref 0–44)
AST: 20 U/L (ref 15–41)
Albumin: 4 g/dL (ref 3.5–5.0)
Alkaline Phosphatase: 113 U/L (ref 38–126)
Anion gap: 9 (ref 5–15)
BUN: 12 mg/dL (ref 6–20)
CO2: 26 mmol/L (ref 22–32)
Calcium: 9.3 mg/dL (ref 8.9–10.3)
Chloride: 104 mmol/L (ref 98–111)
Creatinine: 1.19 mg/dL (ref 0.61–1.24)
GFR, Estimated: >60 mL/min (ref >60)
Glucose, Bld: 187 mg/dL — ABNORMAL HIGH (ref 70–99)
Potassium: 4 mmol/L (ref 3.5–5.1)
Sodium: 139 mmol/L (ref 135–145)
Total Bilirubin: 0.4 mg/dL (ref 0.3–1.2)
Total Protein: 7 g/dL (ref 6.5–8.1)

## 2021-10-23 LAB — CBC WITH DIFFERENTIAL (CANCER CENTER ONLY)
Abs Immature Granulocytes: 0.01 10*3/uL (ref 0.00–0.07)
Basophils Absolute: 0.1 10*3/uL (ref 0.0–0.1)
Basophils Relative: 1 %
Eosinophils Absolute: 0.5 10*3/uL (ref 0.0–0.5)
Eosinophils Relative: 9 %
HCT: 41 % (ref 39.0–52.0)
Hemoglobin: 13.9 g/dL (ref 13.0–17.0)
Immature Granulocytes: 0 %
Lymphocytes Relative: 34 %
Lymphs Abs: 1.9 10*3/uL (ref 0.7–4.0)
MCH: 26 pg (ref 26.0–34.0)
MCHC: 33.9 g/dL (ref 30.0–36.0)
MCV: 76.8 fL — ABNORMAL LOW (ref 80.0–100.0)
Monocytes Absolute: 0.5 10*3/uL (ref 0.1–1.0)
Monocytes Relative: 9 %
Neutro Abs: 2.6 10*3/uL (ref 1.7–7.7)
Neutrophils Relative %: 47 %
Platelet Count: 313 10*3/uL (ref 150–400)
RBC: 5.34 MIL/uL (ref 4.22–5.81)
RDW: 13.1 % (ref 11.5–15.5)
WBC Count: 5.5 10*3/uL (ref 4.0–10.5)
nRBC: 0 % (ref 0.0–0.2)

## 2021-10-23 MED ORDER — OXALIPLATIN CHEMO INJECTION 100 MG/20ML
130.0000 mg/m2 | Freq: Once | INTRAVENOUS | Status: AC
  Administered 2021-10-23: 265 mg via INTRAVENOUS
  Filled 2021-10-23: qty 53

## 2021-10-23 MED ORDER — DEXTROSE 5 % IV SOLN: Freq: Once | INTRAVENOUS | Status: AC

## 2021-10-23 MED ORDER — DEXAMETHASONE SODIUM PHOSPHATE 100 MG/10ML IJ SOLN
10.0000 mg | Freq: Once | INTRAMUSCULAR | Status: AC
  Administered 2021-10-23: 10 mg via INTRAVENOUS
  Filled 2021-10-23: qty 10

## 2021-10-23 MED ORDER — PALONOSETRON HCL INJECTION 0.25 MG/5ML
0.2500 mg | Freq: Once | INTRAVENOUS | Status: AC
  Administered 2021-10-23: 0.25 mg via INTRAVENOUS
  Filled 2021-10-23: qty 5

## 2021-10-23 NOTE — Patient Instructions (Signed)
[facility] MEDICAL ONCOLOGY  Discharge Instructions: Thank you for choosing [facility] to provide your oncology and hematology care.   If you have a lab appointment with the Cancer Center, please go directly to the Cancer Center and check in at the registration area.   Wear comfortable clothing and clothing appropriate for easy access to any Portacath or PICC line.   We strive to give you quality time with your provider. You may need to reschedule your appointment if you arrive late (15 or more minutes).  Arriving late affects you and other patients whose appointments are after yours.  Also, if you miss three or more appointments without notifying the office, you may be dismissed from the clinic at the provider's discretion.      For prescription refill requests, have your pharmacy contact our office and allow 72 hours for refills to be completed.    Today you received the following chemotherapy and/or immunotherapy agents: Oxaliplatin.       To help prevent nausea and vomiting after your treatment, we encourage you to take your nausea medication as directed.  BELOW ARE SYMPTOMS THAT SHOULD BE REPORTED IMMEDIATELY: *FEVER GREATER THAN 100.4 F (38 C) OR HIGHER *CHILLS OR SWEATING *NAUSEA AND VOMITING THAT IS NOT CONTROLLED WITH YOUR NAUSEA MEDICATION *UNUSUAL SHORTNESS OF BREATH *UNUSUAL BRUISING OR BLEEDING *URINARY PROBLEMS (pain or burning when urinating, or frequent urination) *BOWEL PROBLEMS (unusual diarrhea, constipation, pain near the anus) TENDERNESS IN MOUTH AND THROAT WITH OR WITHOUT PRESENCE OF ULCERS (sore throat, sores in mouth, or a toothache) UNUSUAL RASH, SWELLING OR PAIN  UNUSUAL VAGINAL DISCHARGE OR ITCHING   Items with * indicate a potential emergency and should be followed up as soon as possible or go to the Emergency Department if any problems should occur.  Please show the CHEMOTHERAPY ALERT CARD or IMMUNOTHERAPY ALERT CARD at check-in  to the Emergency Department and triage nurse.  Should you have questions after your visit or need to cancel or reschedule your appointment, please contact [facility]ICAL [facility]COLOG[facility]  Dept: [phone]  and follow the prompts.  Office hours are 8:00 a.m. to 4:30 p.m. Monday - Friday. Please note that voicemails left after 4:00 p.m. may not be returned until the following business day.  We are closed weekends and major holidays. You have access to a nurse at all times for urgent questions. Please call the main number to the clinic Dept: [phone] and follow the prompts.   For any non-urgent questions, you may also contact your provider using MyChart. We now offer e-Visits for anyone 18 and older to request care online for non-urgent symptoms. For details visit [email or web address].   Also download the MyChart app! Go to the app store, search "MyChart", open the app, select [facility], and log in with your MyChart username and password.  Due to Covid, a mask is required upon entering the hospital/clinic. If you do not have a mask, one will be given to you upon arrival. For doctor visits, patients may have 1 support person aged 18 or older with them. For treatment visits, patients cannot have anyone with them due to current Covid guidelines and our immunocompromised population.   

## 2021-10-23 NOTE — Progress Notes (Signed)
[facility]   [phone] Fax:([phone]) [phone]   Clinic Follow up Note   Patient Care Team: Tysinger, Kermit Balo, PA-C as PCP - General (Family Medicine) Andria Meuse, MD as Consulting Physician (General Surgery) Malachy Mood, MD as Consulting Physician (Hematology) Dorothy Puffer, MD as Consulting Physician (Radiation Oncology) Pollyann Samples, NP as Nurse Practitioner (Nurse Practitioner) Marily Lente, RN as Registered Nurse  Date of Service:  10/23/2021  CHIEF COMPLAINT: f/u of rectal cancer  CURRENT THERAPY:  Capox q3weeks, starting 10/23/21  ASSESSMENT & PLAN:  Bryan [name] is a 50 y.o. male with   1. Adenocarcinoma of the rectum, MMR normal, Tx Nx M0 -He presented with anorectal pain and intermittent bleeding, work-up showed a 4 cm mass in the distal rectum, path confirmed invasive adenocarcinoma.  Staging work-up is concerning for extension into the mesorectum and possible local lymphadenopathy.   -Baseline CEA normal 3.9 -re-read of staging pelvic MRI on 10/13/21 showed stage T3b, N1 disease, with contact with internal sphincter. -he is scheduled to begin neoadjuvant chemo with Capox today.   2. Anorectal pain and bleeding -secondary to #1 -CBC has been normal. -managed with ibuprofen 800 mg PRN. I recommend he switch to tylenol while on chemo. -restart miralax to avoid constipation   Genetics -Patient has strong family history of breast cancer and one relative with prostate cancer.  -Given his personal and family history of cancer he qualifies for genetic testing and is interested.   -He has 3 children who are aware of his diagnosis and their potential risks -his rectal tumor is MMR normal, likely not Lynch syndrome -Referral placed today   3.  Goals of care -We discussed the goal of neoadjuvant chemo is curative    PLAN: -proceed with C1 oxaliplatin today -start Xeloda tonight. He works night shift, and will take Xeloda at 4am and 4pm   -phone visit for toxicity check with NP Lacie on 11/1 at 12 pm -lab, f/u, and oxaliplatin in 3 and 6 weeks   No problem-specific Assessment & Plan notes found for this encounter.   SUMMARY OF ONCOLOGIC HISTORY: Oncology History  Rectal cancer (HCC)  08/18/2021 Initial Diagnosis   Rectal cancer (HCC)   09/14/2021 Procedure   Colonoscopy by Dr. Tiajuana Amass -The perianal examination was normal. - The digital rectal exam revealed a firm rectal mass palpated 1-2 cm from the anal verge. The mass was non-circumferential and located predominantly at the left bowel wall. - An ulcerated non-obstructing medium-sized friable mass was found in the distal rectum. The mass was non-circumferential. The mass measured three cm in length. No bleeding was present. Biopsies (5 passes total for 10 total biopsy specimens)were taken with a cold forceps for histology. Estimated blood loss was minimal. - The exam was otherwise normal throughout the examined colon. - The terminal ileum appeared normal. - No additional abnormalities were found on retroflexion   09/14/2021 Initial Biopsy   Diagnosis Rectum, biopsy, mass - INVASIVE ADENOCARCINOMA.  MMR normal  Baseline CEA 3.9     09/17/2021 Imaging   CT CAP IMPRESSION: 1. Known left rectal mass measures approximately 3.3 x 1.9 x 4.1 cm and is associated with asymmetric haziness in the left perirectal fat and small left perirectal lymph nodes. 2. No findings highly suspicious for distant metastatic disease. There are 2 small indeterminate low-density hepatic lesions. Consider MRI for further characterization. 3. No suspicious findings in the chest. Small perifissural nodules bilaterally, likely benign.   09/25/2021 Imaging  Outside liver and pelvic MRI   10/13/2021 Cancer Staging   Staging form: Colon and Rectum, AJCC 8th Edition - Clinical stage from 10/13/2021: Stage IIIB (cT3, cN1, cM0) - Signed by Truitt Merle, MD on 10/23/2021 Stage  prefix: Initial diagnosis    10/23/2021 -  Chemotherapy   Patient is on Treatment Plan : RECTAL Xelox (Capeox) q21d x 6 cycles        INTERVAL HISTORY:  Bryan [name] is here for a follow up of rectal cancer. He was last seen by me on 10/14/21 in consultation with NP Lacie. He presents to the clinic accompanied by his wife. He reports he works third shift for the [employer].   All other systems were reviewed with the patient and are negative.  MEDICAL HISTORY:  Past Medical History:  Diagnosis Date   Allergic rhinitis, cause unspecified    spring only   Hypercholesteremia    Rectal cancer ([place]) 09/14/2021    SURGICAL HISTORY: Past Surgical History:  Procedure Laterality Date   None      I have reviewed the social history and family history with the patient and they are unchanged from previous note.  ALLERGIES:  has No Known Allergies.  MEDICATIONS:  Current Outpatient Medications  Medication Sig Dispense Refill   capecitabine (XELODA) 500 MG tablet Take 3 tabs in morning and 4 tabs in evening, every 12 hours, for 14 days then off for 7 days 294 tablet 0   hydrocortisone 2.5 % cream Apply topically 2 (two) times daily. (Patient not taking: No sig reported) 30 g 1   ibuprofen (ADVIL) 800 MG tablet Take 1 tablet (800 mg total) by mouth 2 (two) times daily as needed for moderate pain. Take with food. Follow up with PCP for refills 30 tablet 1   ondansetron (ZOFRAN) 8 MG tablet Take 1 tablet (8 mg total) by mouth 2 (two) times daily as needed for refractory nausea / vomiting. Start on day 3 after chemotherapy. 30 tablet 1   prochlorperazine (COMPAZINE) 10 MG tablet Take 1 tablet (10 mg total) by mouth every 6 (six) hours as needed (Nausea or vomiting). 30 tablet 1   No current facility-administered medications for this visit.    PHYSICAL EXAMINATION: ECOG PERFORMANCE STATUS: 1 - Symptomatic but completely ambulatory  Vitals:   10/23/21 1305  BP: 128/88  Pulse: 76  Resp:  18  Temp: 98.4 F (36.9 C)  SpO2: 99%   Wt Readings from Last 3 Encounters:  10/23/21 186 lb 14.4 oz (84.8 kg)  10/14/21 185 lb 7 oz (84.1 kg)  10/13/21 179 lb (81.2 kg)     GENERAL:alert, no distress and comfortable SKIN: skin color normal, no rashes or significant lesions EYES: normal, Conjunctiva are pink and non-injected, sclera clear  NEURO: alert & oriented x 3 with fluent speech  LABORATORY DATA:  I have reviewed the data as listed CBC Latest Ref Rng & Units 10/23/2021 08/18/2021  WBC 4.0 - 10.5 K/uL 5.5 5.6  Hemoglobin 13.0 - 17.0 g/dL 13.9 14.5  Hematocrit 39.0 - 52.0 % 41.0 44.0  Platelets 150 - 400 K/uL 313 309.0     CMP Latest Ref Rng & Units 10/23/2021 08/18/2021 10/19/2011  Glucose 70 - 99 mg/dL 187(H) 79 103(H)  BUN 6 - 20 mg/dL 12 11 -  Creatinine 0.61 - 1.24 mg/dL 1.19 1.15 -  Sodium 135 - 145 mmol/L 139 139 -  Potassium 3.5 - 5.1 mmol/L 4.0 4.0 -  Chloride 98 - 111 mmol/L 104  102 -  CO2 22 - 32 mmol/L 26 30 -  Calcium 8.9 - 10.3 mg/dL 9.3 9.9 -  Total Protein 6.5 - 8.1 g/dL 7.0 7.3 -  Total Bilirubin 0.3 - 1.2 mg/dL 0.4 0.4 -  Alkaline Phos 38 - 126 U/L 113 97 -  AST 15 - 41 U/L 20 12 -  ALT 0 - 44 U/L 43 20 -      RADIOGRAPHIC STUDIES: I have personally reviewed the radiological images as listed and agreed with the findings in the report. Dg Outside Films Body/Abd/Pelvis  Result Date: 10/23/2021 : REVIEW OF OUTSIDE IMAGING - NO PATIENT CHARGE Today's Date: 10/23/2021 Patient Name: Bryan [name], Bryan [name] Patient DOB: May 22, 1971 Requesting Provider: Shona Simpson I was asked to review a OUTSIDE FILM BODY on the above named patient. Imaging studies reviewed: MRI MRA of the pelvis dated 09/25/2021 is submitted. The MRI will be concentrated on with a rectal MRI template used as follows. Exam is mildly motion degraded throughout. My response is as follows: TUMOR LOCATION Tumor distance from Anal Verge/Skin Surface: Approximally 7 cm Tumor distance from Internal Anal  Sphincter: Is felt to contact the sphincter including on 50/4. TUMOR DESCRIPTION Circumferential Extent: Approximately 180 degrees, from 12:00 to 6:00 including on 46/4. Tumor Length: 4.1 cm, including on 27/5 T - CATEGORY Extension through Muscularis Propria: Yes 1-56mm=T3b. Example at 5 mm at the 5 o'clock position on 45/4. Shortest Distance of any tumor/node from Mesorectal Fascia: 11 Extramural Vascular Invasion/Tumor Thrombus: No Invasion of Anterior Peritoneal Reflection: No Involvement of Adjacent Organs or Pelvic Sidewall: No Levator Ani Involvement: equivocal. Contact with the levator on 50/4. N - CATEGORY Mesorectal Lymph Nodes >=57mm: 1-3=N1 . Perirectal nodes including at 6 mm on 43/4 and 5 mm on 95/3 of the 09/17/2021 CT. Extra-mesorectal Lymphadenopathy: No Other: None. Small fat containing left inguinal hernia. Normal urinary bladder. No significant free fluid. No bowel obstruction IMPRESSION: Rectal adenocarcinoma T stage: T3b Rectal adenocarcinoma N stage:  N1 Contact with the internal sphincter. Limited evaluation for gross invasion. Limited exam, secondary to technique and mild motion. Disclaimer: This information was not provided in response to a formal consult. This response was not provided to render treatment or advise a treatment plan for this patient, as I did not have access to all of this patient's medical information and did not have all of the facts for this current episode of care. Electronically Signed   By: Abigail Miyamoto M.D.   On: 10/23/2021 06:12      No orders of the defined types were placed in this encounter.  All questions were answered. The patient knows to call the clinic with any problems, questions or concerns. No barriers to learning was detected. The total time spent in the appointment was 30 minutes.     Truitt Merle, MD 10/23/2021   I, Wilburn Mylar, am acting as scribe for Truitt Merle, MD.   I have reviewed the above documentation for accuracy and completeness,  and I agree with the above.

## 2021-10-23 NOTE — Progress Notes (Signed)
Met with patient at registration to introduce myself as Financial Resource Specialist and to offer available resources.  Discussed one-time $1000 [employer] grant and qualifications to assist with personal expenses while going through treatment.  Gave him my card if interested in applying and for any additional financial questions or concerns.  

## 2021-10-26 ENCOUNTER — Encounter: Payer: Self-pay | Admitting: Hematology

## 2021-10-26 ENCOUNTER — Telehealth: Payer: Self-pay | Admitting: Hematology

## 2021-10-26 ENCOUNTER — Encounter: Payer: BC Managed Care – PPO | Admitting: Licensed Clinical Social Worker

## 2021-10-26 NOTE — Progress Notes (Signed)
No answer for today's phone chemo toxicity check. I followed up with a mychart message asking that he let us know how he is doing.   Cira Rue, NP

## 2021-10-26 NOTE — Telephone Encounter (Signed)
Scheduled follow-up appointments per 10/28 los. Patient's wife is aware.

## 2021-10-27 ENCOUNTER — Other Ambulatory Visit: Payer: Self-pay

## 2021-10-27 ENCOUNTER — Inpatient Hospital Stay: Payer: BC Managed Care – PPO | Attending: Nurse Practitioner | Admitting: Nurse Practitioner

## 2021-10-27 ENCOUNTER — Encounter: Payer: Self-pay | Admitting: Nurse Practitioner

## 2021-10-27 DIAGNOSIS — C20 Malignant neoplasm of rectum: Secondary | ICD-10-CM | POA: Insufficient documentation

## 2021-10-27 DIAGNOSIS — Z5111 Encounter for antineoplastic chemotherapy: Secondary | ICD-10-CM | POA: Insufficient documentation

## 2021-10-27 DIAGNOSIS — G893 Neoplasm related pain (acute) (chronic): Secondary | ICD-10-CM | POA: Insufficient documentation

## 2021-10-29 ENCOUNTER — Telehealth: Payer: Self-pay

## 2021-10-29 NOTE — Telephone Encounter (Signed)
Spoke with pt via telephone regarding MyChart message to Dr. Burr Medico regarding phone visit appt.  Pt stated he spoke with Cira Rue, NP via telephone yesterday 10/28/2021 and they went over how to take the Xeloda.  Pt had no further questions at this time.

## 2021-11-06 ENCOUNTER — Telehealth: Payer: Self-pay

## 2021-11-06 NOTE — Telephone Encounter (Signed)
Notified Patient of completion of FMLA Form. Fax Transmission Confirmation received. Copy mailed to Patient as requested.

## 2021-11-13 ENCOUNTER — Other Ambulatory Visit: Payer: Self-pay

## 2021-11-13 ENCOUNTER — Encounter: Payer: Self-pay | Admitting: Hematology

## 2021-11-13 ENCOUNTER — Inpatient Hospital Stay (HOSPITAL_BASED_OUTPATIENT_CLINIC_OR_DEPARTMENT_OTHER): Payer: BC Managed Care – PPO | Admitting: Hematology

## 2021-11-13 ENCOUNTER — Inpatient Hospital Stay: Payer: BC Managed Care – PPO

## 2021-11-13 VITALS — BP 121/91 | HR 77 | Temp 98.2°F | Resp 18 | Ht 70.0 in | Wt 187.3 lb

## 2021-11-13 DIAGNOSIS — C20 Malignant neoplasm of rectum: Secondary | ICD-10-CM | POA: Diagnosis present

## 2021-11-13 DIAGNOSIS — G893 Neoplasm related pain (acute) (chronic): Secondary | ICD-10-CM | POA: Diagnosis not present

## 2021-11-13 DIAGNOSIS — Z5111 Encounter for antineoplastic chemotherapy: Secondary | ICD-10-CM | POA: Diagnosis present

## 2021-11-13 LAB — CMP (CANCER CENTER ONLY)
ALT: 28 U/L (ref 0–44)
AST: 18 U/L (ref 15–41)
Albumin: 3.9 g/dL (ref 3.5–5.0)
Alkaline Phosphatase: 116 U/L (ref 38–126)
Anion gap: 10 (ref 5–15)
BUN: 13 mg/dL (ref 6–20)
CO2: 23 mmol/L (ref 22–32)
Calcium: 8.8 mg/dL — ABNORMAL LOW (ref 8.9–10.3)
Chloride: 104 mmol/L (ref 98–111)
Creatinine: 1.18 mg/dL (ref 0.61–1.24)
GFR, Estimated: >60 mL/min (ref >60)
Glucose, Bld: 186 mg/dL — ABNORMAL HIGH (ref 70–99)
Potassium: 4 mmol/L (ref 3.5–5.1)
Sodium: 137 mmol/L (ref 135–145)
Total Bilirubin: 0.3 mg/dL (ref 0.3–1.2)
Total Protein: 6.7 g/dL (ref 6.5–8.1)

## 2021-11-13 LAB — CBC WITH DIFFERENTIAL (CANCER CENTER ONLY)
Abs Immature Granulocytes: 0.01 10*3/uL (ref 0.00–0.07)
Basophils Absolute: 0.1 10*3/uL (ref 0.0–0.1)
Basophils Relative: 1 %
Eosinophils Absolute: 0.4 10*3/uL (ref 0.0–0.5)
Eosinophils Relative: 9 %
HCT: 41 % (ref 39.0–52.0)
Hemoglobin: 13.6 g/dL (ref 13.0–17.0)
Immature Granulocytes: 0 %
Lymphocytes Relative: 43 %
Lymphs Abs: 1.9 10*3/uL (ref 0.7–4.0)
MCH: 25.9 pg — ABNORMAL LOW (ref 26.0–34.0)
MCHC: 33.2 g/dL (ref 30.0–36.0)
MCV: 78.1 fL — ABNORMAL LOW (ref 80.0–100.0)
Monocytes Absolute: 0.5 10*3/uL (ref 0.1–1.0)
Monocytes Relative: 11 %
Neutro Abs: 1.6 10*3/uL — ABNORMAL LOW (ref 1.7–7.7)
Neutrophils Relative %: 36 %
Platelet Count: 250 10*3/uL (ref 150–400)
RBC: 5.25 MIL/uL (ref 4.22–5.81)
RDW: 14.6 % (ref 11.5–15.5)
WBC Count: 4.4 10*3/uL (ref 4.0–10.5)
nRBC: 0 % (ref 0.0–0.2)

## 2021-11-13 LAB — CEA (IN HOUSE-CHCC): CEA (CHCC-In House): 4.58 ng/mL (ref 0.00–5.00)

## 2021-11-13 MED ORDER — OXALIPLATIN CHEMO INJECTION 100 MG/20ML
130.0000 mg/m2 | Freq: Once | INTRAVENOUS | Status: AC
  Administered 2021-11-13: 265 mg via INTRAVENOUS
  Filled 2021-11-13: qty 53

## 2021-11-13 MED ORDER — PALONOSETRON HCL INJECTION 0.25 MG/5ML
0.2500 mg | Freq: Once | INTRAVENOUS | Status: AC
  Administered 2021-11-13: 0.25 mg via INTRAVENOUS
  Filled 2021-11-13: qty 5

## 2021-11-13 MED ORDER — PANTOPRAZOLE SODIUM 20 MG PO TBEC: 20.0000 mg | DELAYED_RELEASE_TABLET | Freq: Every day | ORAL | 2 refills | Status: DC

## 2021-11-13 MED ORDER — DEXTROSE 5 % IV SOLN: Freq: Once | INTRAVENOUS | Status: AC

## 2021-11-13 MED ORDER — SODIUM CHLORIDE 0.9 % IV SOLN
10.0000 mg | Freq: Once | INTRAVENOUS | Status: AC
  Administered 2021-11-13: 10 mg via INTRAVENOUS
  Filled 2021-11-13: qty 10

## 2021-11-13 NOTE — Progress Notes (Signed)
[facility]   Telephone:([phone] [phone]   Clinic Follow up Note   Patient Care Team: Tysinger, Kermit Balo, PA-C as PCP - General (Family Medicine) Andria Meuse, MD as Consulting Physician (General Surgery) Malachy Mood, MD as Consulting Physician (Hematology) Dorothy Puffer, MD as Consulting Physician (Radiation Oncology) Pollyann Samples, NP as Nurse Practitioner (Nurse Practitioner) Marily Lente, RN as Registered Nurse  Date of Service:  11/13/2021  CHIEF COMPLAINT: f/u of rectal cancer  CURRENT THERAPY:  Capox q3weeks, starting 10/23/21  ASSESSMENT & PLAN:  Bryan [name] is a 50 y.o. male with   1. Adenocarcinoma of the rectum, MMR normal, Tx Nx M0 -He presented with anorectal pain and intermittent bleeding, work-up showed a 4 cm mass in the distal rectum, path confirmed invasive adenocarcinoma.  Staging work-up is concerning for extension into the mesorectum and possible local lymphadenopathy.   -Baseline CEA normal 3.9 -re-read of staging pelvic MRI on 10/13/21 showed stage T3b, N1 disease, with contact with internal sphincter. -he began neoadjuvant chemo with Capox on 10/23/21. He tolerated relatively well with nausea. He also notes some reflux symptoms, so I will prescribe protonix for him. -labs reviewed, adequate to proceed with C2 today. He will start Xeloda tonight (he works night shift)   2. Anorectal pain and bleeding -secondary to #1 -CBC has been normal. -managed with ibuprofen 800 mg PRN. I recommend he switch to tylenol while on chemo. -restart miralax to avoid constipation   3. Genetics -Patient has strong family history of breast cancer and one relative with prostate cancer.  -Given his personal and family history of cancer he qualifies for genetic testing and is interested.   -He has 3 children who are aware of his diagnosis and their potential risks -his rectal tumor is MMR normal, likely not Lynch syndrome -Referral placed  at consultation on 10/14/21     PLAN: -proceed with C2 oxaliplatin today -start C2 Xeloda tonight.  -lab, f/u, and oxaliplatin in 3 and 6 weeks   No problem-specific Assessment & Plan notes found for this encounter.   SUMMARY OF ONCOLOGIC HISTORY: Oncology History  Rectal cancer (HCC)  08/18/2021 Initial Diagnosis   Rectal cancer (HCC)   09/14/2021 Procedure   Colonoscopy by Dr. Tiajuana Amass -The perianal examination was normal. - The digital rectal exam revealed a firm rectal mass palpated 1-2 cm from the anal verge. The mass was non-circumferential and located predominantly at the left bowel wall. - An ulcerated non-obstructing medium-sized friable mass was found in the distal rectum. The mass was non-circumferential. The mass measured three cm in length. No bleeding was present. Biopsies (5 passes total for 10 total biopsy specimens)were taken with a cold forceps for histology. Estimated blood loss was minimal. - The exam was otherwise normal throughout the examined colon. - The terminal ileum appeared normal. - No additional abnormalities were found on retroflexion   09/14/2021 Initial Biopsy   Diagnosis Rectum, biopsy, mass - INVASIVE ADENOCARCINOMA.  MMR normal  Baseline CEA 3.9     09/17/2021 Imaging   CT CAP IMPRESSION: 1. Known left rectal mass measures approximately 3.3 x 1.9 x 4.1 cm and is associated with asymmetric haziness in the left perirectal fat and small left perirectal lymph nodes. 2. No findings highly suspicious for distant metastatic disease. There are 2 small indeterminate low-density hepatic lesions. Consider MRI for further characterization. 3. No suspicious findings in the chest. Small perifissural nodules bilaterally, likely benign.   09/25/2021 Imaging  Outside liver and pelvic MRI   10/13/2021 Cancer Staging   Staging form: Colon and Rectum, AJCC 8th Edition - Clinical stage from 10/13/2021: Stage IIIB (cT3, cN1, cM0) - Signed by  Truitt Merle, MD on 10/23/2021 Stage prefix: Initial diagnosis    10/23/2021 -  Chemotherapy   Patient is on Treatment Plan : RECTAL Xelox (Capeox) q21d x 6 cycles        INTERVAL HISTORY:  Bryan [name] is here for a follow up of rectal cancer. He was last seen by me on 10/23/21. He presents to the clinic accompanied by his wife. He reports he tolerated his first cycle of Capox well. He reports he had some nausea but never vomited. He notes his appetite has remained good.   All other systems were reviewed with the patient and are negative.  MEDICAL HISTORY:  Past Medical History:  Diagnosis Date   Allergic rhinitis, cause unspecified    spring only   Hypercholesteremia    Rectal cancer ([place]) 09/14/2021    SURGICAL HISTORY: Past Surgical History:  Procedure Laterality Date   None      I have reviewed the social history and family history with the patient and they are unchanged from previous note.  ALLERGIES:  has No Known Allergies.  MEDICATIONS:  Current Outpatient Medications  Medication Sig Dispense Refill   pantoprazole (PROTONIX) 20 MG tablet Take 1 tablet (20 mg total) by mouth daily. 30 tablet 2   capecitabine (XELODA) 500 MG tablet Take 3 tabs in morning and 4 tabs in evening, every 12 hours, for 14 days then off for 7 days 294 tablet 0   hydrocortisone 2.5 % cream Apply topically 2 (two) times daily. (Patient not taking: No sig reported) 30 g 1   ibuprofen (ADVIL) 800 MG tablet Take 1 tablet (800 mg total) by mouth 2 (two) times daily as needed for moderate pain. Take with food. Follow up with PCP for refills 30 tablet 1   ondansetron (ZOFRAN) 8 MG tablet Take 1 tablet (8 mg total) by mouth 2 (two) times daily as needed for refractory nausea / vomiting. Start on day 3 after chemotherapy. 30 tablet 1   prochlorperazine (COMPAZINE) 10 MG tablet Take 1 tablet (10 mg total) by mouth every 6 (six) hours as needed (Nausea or vomiting). 30 tablet 1   No current  facility-administered medications for this visit.    PHYSICAL EXAMINATION: ECOG PERFORMANCE STATUS: 0 - Asymptomatic  Vitals:   11/13/21 1150  BP: (!) 121/91  Pulse: 77  Resp: 18  Temp: 98.2 F (36.8 C)  SpO2: 98%   Wt Readings from Last 3 Encounters:  11/13/21 187 lb 4.8 oz (85 kg)  10/23/21 186 lb 14.4 oz (84.8 kg)  10/14/21 185 lb 7 oz (84.1 kg)     GENERAL:alert, no distress and comfortable SKIN: skin color normal, no rashes or significant lesions EYES: normal, Conjunctiva are pink and non-injected, sclera clear  NEURO: alert & oriented x 3 with fluent speech  LABORATORY DATA:  I have reviewed the data as listed CBC Latest Ref Rng & Units 11/13/2021 10/23/2021 08/18/2021  WBC 4.0 - 10.5 K/uL 4.4 5.5 5.6  Hemoglobin 13.0 - 17.0 g/dL 13.6 13.9 14.5  Hematocrit 39.0 - 52.0 % 41.0 41.0 44.0  Platelets 150 - 400 K/uL 250 313 309.0     CMP Latest Ref Rng & Units 11/13/2021 10/23/2021 08/18/2021  Glucose 70 - 99 mg/dL 186(H) 187(H) 79  BUN 6 - 20 mg/dL 13 12  11  Creatinine 0.61 - 1.24 mg/dL 1.18 1.19 1.15  Sodium 135 - 145 mmol/L 137 139 139  Potassium 3.5 - 5.1 mmol/L 4.0 4.0 4.0  Chloride 98 - 111 mmol/L 104 104 102  CO2 22 - 32 mmol/L $RemoveB'23 26 30  'psoHAvvL$ Calcium 8.9 - 10.3 mg/dL 8.8(L) 9.3 9.9  Total Protein 6.5 - 8.1 g/dL 6.7 7.0 7.3  Total Bilirubin 0.3 - 1.2 mg/dL 0.3 0.4 0.4  Alkaline Phos 38 - 126 U/L 116 113 97  AST 15 - 41 U/L $Remo'18 20 12  'EKwie$ ALT 0 - 44 U/L 28 43 20      RADIOGRAPHIC STUDIES: I have personally reviewed the radiological images as listed and agreed with the findings in the report. No results found.    No orders of the defined types were placed in this encounter.  All questions were answered. The patient knows to call the clinic with any problems, questions or concerns. No barriers to learning was detected. The total time spent in the appointment was 30 minutes.     Truitt Merle, MD 11/13/2021   I, Wilburn Mylar, am acting as scribe for Truitt Merle,  MD.   I have reviewed the above documentation for accuracy and completeness, and I agree with the above.

## 2021-11-13 NOTE — Patient Instructions (Signed)
[facility]  Discharge Instructions: Thank you for choosing [facility] to provide your oncology and hematology care.   If you have a lab appointment with the Cancer Center, please go directly to the Cancer Center and check in at the registration area.   Wear comfortable clothing and clothing appropriate for easy access to any Portacath or PICC line.   We strive to give you quality time with your provider. You may need to reschedule your appointment if you arrive late (15 or more minutes).  Arriving late affects you and other patients whose appointments are after yours.  Also, if you miss three or more appointments without notifying the office, you may be dismissed from the clinic at the provider's discretion.      For prescription refill requests, have your pharmacy contact our office and allow 72 hours for refills to be completed.    Today you received the following chemotherapy and/or immunotherapy agents: Oxaliplatin.       To help prevent nausea and vomiting after your treatment, we encourage you to take your nausea medication as directed.  BELOW ARE SYMPTOMS THAT SHOULD BE REPORTED IMMEDIATELY: *FEVER GREATER THAN 100.4 F (38 C) OR HIGHER *CHILLS OR SWEATING *NAUSEA AND VOMITING THAT IS NOT CONTROLLED WITH YOUR NAUSEA MEDICATION *UNUSUAL SHORTNESS OF BREATH *UNUSUAL BRUISING OR BLEEDING *URINARY PROBLEMS (pain or burning when urinating, or frequent urination) *BOWEL PROBLEMS (unusual diarrhea, constipation, pain near the anus) TENDERNESS IN MOUTH AND THROAT WITH OR WITHOUT PRESENCE OF ULCERS (sore throat, sores in mouth, or a toothache) UNUSUAL RASH, SWELLING OR PAIN  UNUSUAL VAGINAL DISCHARGE OR ITCHING   Items with * indicate a potential emergency and should be followed up as soon as possible or go to the Emergency Department if any problems should occur.  Please show the CHEMOTHERAPY ALERT CARD or IMMUNOTHERAPY ALERT CARD at check-in  to the Emergency Department and triage nurse.  Should you have questions after your visit or need to cancel or reschedule your appointment, please contact [facility]  Dept: [phone]  and follow the prompts.  Office hours are 8:00 a.m. to 4:30 p.m. Monday - Friday. Please note that voicemails left after 4:00 p.m. may not be returned until the following business day.  We are closed weekends and major holidays. You have access to a nurse at all times for urgent questions. Please call the main number to the clinic Dept: [phone] and follow the prompts.   For any non-urgent questions, you may also contact your provider using MyChart. We now offer e-Visits for anyone 18 and older to request care online for non-urgent symptoms. For details visit [email or web address].   Also download the MyChart app! Go to the app store, search "MyChart", open the app, select [facility], and log in with your MyChart username and password.  Due to Covid, a mask is required upon entering the hospital/clinic. If you do not have a mask, one will be given to you upon arrival. For doctor visits, patients may have 1 support person aged 18 or older with them. For treatment visits, patients cannot have anyone with them due to current Covid guidelines and our immunocompromised population.   

## 2021-11-16 ENCOUNTER — Encounter: Payer: Self-pay | Admitting: Hematology

## 2021-11-17 ENCOUNTER — Telehealth: Payer: Self-pay | Admitting: Hematology

## 2021-11-17 NOTE — Telephone Encounter (Signed)
Scheduled follow-up appointment per 11/18 los. Patient's wife is aware.

## 2021-12-03 MED FILL — Dexamethasone Sodium Phosphate Inj 100 MG/10ML: INTRAMUSCULAR | Qty: 1 | Status: AC

## 2021-12-04 ENCOUNTER — Inpatient Hospital Stay: Payer: BC Managed Care – PPO

## 2021-12-04 ENCOUNTER — Other Ambulatory Visit: Payer: Self-pay

## 2021-12-04 ENCOUNTER — Encounter: Payer: Self-pay | Admitting: Hematology

## 2021-12-04 ENCOUNTER — Inpatient Hospital Stay: Payer: BC Managed Care – PPO | Attending: Nurse Practitioner

## 2021-12-04 ENCOUNTER — Telehealth: Payer: Self-pay | Admitting: Hematology

## 2021-12-04 ENCOUNTER — Inpatient Hospital Stay (HOSPITAL_BASED_OUTPATIENT_CLINIC_OR_DEPARTMENT_OTHER): Payer: BC Managed Care – PPO | Admitting: Hematology

## 2021-12-04 VITALS — BP 128/92 | HR 80 | Temp 98.5°F | Resp 17 | Ht 70.0 in | Wt 183.1 lb

## 2021-12-04 DIAGNOSIS — M62838 Other muscle spasm: Secondary | ICD-10-CM | POA: Insufficient documentation

## 2021-12-04 DIAGNOSIS — Z5111 Encounter for antineoplastic chemotherapy: Secondary | ICD-10-CM | POA: Insufficient documentation

## 2021-12-04 DIAGNOSIS — K625 Hemorrhage of anus and rectum: Secondary | ICD-10-CM | POA: Diagnosis not present

## 2021-12-04 DIAGNOSIS — Z809 Family history of malignant neoplasm, unspecified: Secondary | ICD-10-CM | POA: Insufficient documentation

## 2021-12-04 DIAGNOSIS — M79601 Pain in right arm: Secondary | ICD-10-CM | POA: Insufficient documentation

## 2021-12-04 DIAGNOSIS — Z8042 Family history of malignant neoplasm of prostate: Secondary | ICD-10-CM | POA: Diagnosis not present

## 2021-12-04 DIAGNOSIS — C20 Malignant neoplasm of rectum: Secondary | ICD-10-CM

## 2021-12-04 DIAGNOSIS — Z79899 Other long term (current) drug therapy: Secondary | ICD-10-CM | POA: Diagnosis not present

## 2021-12-04 DIAGNOSIS — G893 Neoplasm related pain (acute) (chronic): Secondary | ICD-10-CM | POA: Insufficient documentation

## 2021-12-04 DIAGNOSIS — Z803 Family history of malignant neoplasm of breast: Secondary | ICD-10-CM | POA: Diagnosis not present

## 2021-12-04 LAB — IRON AND TIBC
Iron: 64 ug/dL (ref 42–163)
Saturation Ratios: 18 % — ABNORMAL LOW (ref 20–55)
TIBC: 364 ug/dL (ref 202–409)
UIBC: 300 ug/dL (ref 117–376)

## 2021-12-04 LAB — CBC WITH DIFFERENTIAL (CANCER CENTER ONLY)
Abs Immature Granulocytes: 0 10*3/uL (ref 0.00–0.07)
Basophils Absolute: 0 10*3/uL (ref 0.0–0.1)
Basophils Relative: 1 %
Eosinophils Absolute: 0.2 10*3/uL (ref 0.0–0.5)
Eosinophils Relative: 5 %
HCT: 37.4 % — ABNORMAL LOW (ref 39.0–52.0)
Hemoglobin: 12.6 g/dL — ABNORMAL LOW (ref 13.0–17.0)
Immature Granulocytes: 0 %
Lymphocytes Relative: 48 %
Lymphs Abs: 1.5 10*3/uL (ref 0.7–4.0)
MCH: 26.7 pg (ref 26.0–34.0)
MCHC: 33.7 g/dL (ref 30.0–36.0)
MCV: 79.2 fL — ABNORMAL LOW (ref 80.0–100.0)
Monocytes Absolute: 0.3 10*3/uL (ref 0.1–1.0)
Monocytes Relative: 10 %
Neutro Abs: 1.2 10*3/uL — ABNORMAL LOW (ref 1.7–7.7)
Neutrophils Relative %: 36 %
Platelet Count: 215 10*3/uL (ref 150–400)
RBC: 4.72 MIL/uL (ref 4.22–5.81)
RDW: 15.9 % — ABNORMAL HIGH (ref 11.5–15.5)
WBC Count: 3.2 10*3/uL — ABNORMAL LOW (ref 4.0–10.5)
nRBC: 0 % (ref 0.0–0.2)

## 2021-12-04 LAB — CMP (CANCER CENTER ONLY)
ALT: 33 U/L (ref 0–44)
AST: 22 U/L (ref 15–41)
Albumin: 3.7 g/dL (ref 3.5–5.0)
Alkaline Phosphatase: 111 U/L (ref 38–126)
Anion gap: 10 (ref 5–15)
BUN: 12 mg/dL (ref 6–20)
CO2: 22 mmol/L (ref 22–32)
Calcium: 8.6 mg/dL — ABNORMAL LOW (ref 8.9–10.3)
Chloride: 106 mmol/L (ref 98–111)
Creatinine: 1.34 mg/dL — ABNORMAL HIGH (ref 0.61–1.24)
GFR, Estimated: >60 mL/min (ref >60)
Glucose, Bld: 154 mg/dL — ABNORMAL HIGH (ref 70–99)
Potassium: 3.8 mmol/L (ref 3.5–5.1)
Sodium: 138 mmol/L (ref 135–145)
Total Bilirubin: 0.4 mg/dL (ref 0.3–1.2)
Total Protein: 6.4 g/dL — ABNORMAL LOW (ref 6.5–8.1)

## 2021-12-04 LAB — CEA (IN HOUSE-CHCC): CEA (CHCC-In House): 4.33 ng/mL (ref 0.00–5.00)

## 2021-12-04 LAB — FERRITIN: Ferritin: 229 ng/mL (ref 24–336)

## 2021-12-04 MED ORDER — DEXAMETHASONE SODIUM PHOSPHATE 100 MG/10ML IJ SOLN
10.0000 mg | Freq: Once | INTRAMUSCULAR | Status: AC
  Administered 2021-12-04: 10 mg via INTRAVENOUS
  Filled 2021-12-04: qty 10

## 2021-12-04 MED ORDER — DEXTROSE 5 % IV SOLN: Freq: Once | INTRAVENOUS | Status: AC

## 2021-12-04 MED ORDER — OXALIPLATIN CHEMO INJECTION 100 MG/20ML
130.0000 mg/m2 | Freq: Once | INTRAVENOUS | Status: AC
  Administered 2021-12-04: 265 mg via INTRAVENOUS
  Filled 2021-12-04: qty 53

## 2021-12-04 MED ORDER — PALONOSETRON HCL INJECTION 0.25 MG/5ML
0.2500 mg | Freq: Once | INTRAVENOUS | Status: AC
  Administered 2021-12-04: 0.25 mg via INTRAVENOUS
  Filled 2021-12-04: qty 5

## 2021-12-04 NOTE — Progress Notes (Signed)
[facility]   Telephone:(336) [phone] Fax:(336) [phone]   Clinic Follow up Note   Patient Care Team: Tysinger, Camelia Eng, PA-C as PCP - General (Family Medicine) Ileana Roup, MD as Consulting Physician (General Surgery) Truitt Merle, MD as Consulting Physician (Hematology) Kyung Rudd, MD as Consulting Physician (Radiation Oncology) Alla Feeling, NP as Nurse Practitioner (Nurse Practitioner) Royston Bake, RN as Registered Nurse  Date of Service:  12/04/2021  CHIEF COMPLAINT: f/u of rectal cancer  CURRENT THERAPY:  Capox q3weeks, starting 10/23/21  ASSESSMENT & PLAN:  Bryan [name] is a 50 y.o. male with   1. Adenocarcinoma of the rectum, MMR normal, cT3b N1 M0 -He presented with anorectal pain and intermittent bleeding, work-up showed a 4 cm mass in the distal rectum, path confirmed invasive adenocarcinoma.  Staging work-up is concerning for extension into the mesorectum and possible local lymphadenopathy.   -Baseline CEA normal 3.9 -re-read of staging pelvic MRI on 10/13/21 showed stage T3b, N1 disease, with contact with internal sphincter. -he began neoadjuvant chemo with Capox on 10/23/21. He tolerated relatively well with nausea. He also notes some reflux symptoms, so I will prescribe protonix for him. -labs reviewed, creatinine is elevated, calcium is low, hgb down some. Overall adequate to proceed with C3 today. He will start Xeloda tonight (he works night shift)   2. Anorectal pain and bleeding -secondary to #1 -he knows to use tylenol for pain and miralax for constipation. -his hgb has dropped slightly to 12.6 today. I encouraged him to eat more red meat or take an oral iron supplement.   3. Genetics -Patient has strong family history of breast cancer and one relative with prostate cancer.  -Given his personal and family history of cancer he qualifies for genetic testing and is interested.   -He has 3 children who are aware of his diagnosis and  their potential risks -his rectal tumor is MMR normal, likely not Lynch syndrome -Referral placed at consultation on 10/14/21     PLAN: -proceed with C3 oxaliplatin today -start C3 Xeloda tonight.  -lab, f/u, and oxaliplatin in 3 and 6 weeks   No problem-specific Assessment & Plan notes found for this encounter.   SUMMARY OF ONCOLOGIC HISTORY: Oncology History  Rectal cancer ([place])  08/18/2021 Initial Diagnosis   Rectal cancer ([place])   09/14/2021 Procedure   Colonoscopy by Dr. Dustin Flock -The perianal examination was normal. - The digital rectal exam revealed a firm rectal mass palpated 1-2 cm from the anal verge. The mass was non-circumferential and located predominantly at the left bowel wall. - An ulcerated non-obstructing medium-sized friable mass was found in the distal rectum. The mass was non-circumferential. The mass measured three cm in length. No bleeding was present. Biopsies (5 passes total for 10 total biopsy specimens)were taken with a cold forceps for histology. Estimated blood loss was minimal. - The exam was otherwise normal throughout the examined colon. - The terminal ileum appeared normal. - No additional abnormalities were found on retroflexion   09/14/2021 Initial Biopsy   Diagnosis Rectum, biopsy, mass - INVASIVE ADENOCARCINOMA.  MMR normal  Baseline CEA 3.9     09/17/2021 Imaging   CT CAP IMPRESSION: 1. Known left rectal mass measures approximately 3.3 x 1.9 x 4.1 cm and is associated with asymmetric haziness in the left perirectal fat and small left perirectal lymph nodes. 2. No findings highly suspicious for distant metastatic disease. There are 2 small indeterminate low-density hepatic lesions. Consider MRI for further characterization.  3. No suspicious findings in the chest. Small perifissural nodules bilaterally, likely benign.   09/25/2021 Imaging      Outside liver and pelvic MRI   10/13/2021 Cancer Staging   Staging form: Colon and  Rectum, AJCC 8th Edition - Clinical stage from 10/13/2021: Stage IIIB (cT3, cN1, cM0) - Signed by Truitt Merle, MD on 10/23/2021 Stage prefix: Initial diagnosis    10/23/2021 -  Chemotherapy   Patient is on Treatment Plan : RECTAL Xelox (Capeox) q21d x 6 cycles        INTERVAL HISTORY:  Bryan [name] is here for a follow up of rectal cancer. He was last seen by me on 11/13/21. He presents to the clinic accompanied by his wife. He reports he had muscle spasms in his calves and feet over the weekend, and he notes it resolved by Sunday evening. He also reports right arm pain from the infusion.   All other systems were reviewed with the patient and are negative.  MEDICAL HISTORY:  Past Medical History:  Diagnosis Date   Allergic rhinitis, cause unspecified    spring only   Hypercholesteremia    Rectal cancer ([place]) 09/14/2021    SURGICAL HISTORY: Past Surgical History:  Procedure Laterality Date   None      I have reviewed the social history and family history with the patient and they are unchanged from previous note.  ALLERGIES:  has No Known Allergies.  MEDICATIONS:  Current Outpatient Medications  Medication Sig Dispense Refill   capecitabine (XELODA) 500 MG tablet Take 3 tabs in morning and 4 tabs in evening, every 12 hours, for 14 days then off for 7 days 294 tablet 0   hydrocortisone 2.5 % cream Apply topically 2 (two) times daily. (Patient not taking: No sig reported) 30 g 1   ibuprofen (ADVIL) 800 MG tablet Take 1 tablet (800 mg total) by mouth 2 (two) times daily as needed for moderate pain. Take with food. Follow up with PCP for refills 30 tablet 1   ondansetron (ZOFRAN) 8 MG tablet Take 1 tablet (8 mg total) by mouth 2 (two) times daily as needed for refractory nausea / vomiting. Start on day 3 after chemotherapy. 30 tablet 1   pantoprazole (PROTONIX) 20 MG tablet Take 1 tablet (20 mg total) by mouth daily. 30 tablet 2   prochlorperazine (COMPAZINE) 10 MG tablet Take 1  tablet (10 mg total) by mouth every 6 (six) hours as needed (Nausea or vomiting). 30 tablet 1   No current facility-administered medications for this visit.    PHYSICAL EXAMINATION: ECOG PERFORMANCE STATUS: 1 - Symptomatic but completely ambulatory  Vitals:   12/04/21 1054  BP: (!) 128/92  Pulse: 80  Resp: 17  Temp: 98.5 F (36.9 C)  SpO2: 98%   Wt Readings from Last 3 Encounters:  12/04/21 183 lb 1.6 oz (83.1 kg)  11/13/21 187 lb 4.8 oz (85 kg)  10/23/21 186 lb 14.4 oz (84.8 kg)     GENERAL:alert, no distress and comfortable SKIN: skin color normal, no rashes or significant lesions EYES: normal, Conjunctiva are pink and non-injected, sclera clear  NEURO: alert & oriented x 3 with fluent speech  LABORATORY DATA:  I have reviewed the data as listed CBC Latest Ref Rng & Units 12/04/2021 11/13/2021 10/23/2021  WBC 4.0 - 10.5 K/uL 3.2(L) 4.4 5.5  Hemoglobin 13.0 - 17.0 g/dL 12.6(L) 13.6 13.9  Hematocrit 39.0 - 52.0 % 37.4(L) 41.0 41.0  Platelets 150 - 400 K/uL 215 250 313  CMP Latest Ref Rng & Units 12/04/2021 11/13/2021 10/23/2021  Glucose 70 - 99 mg/dL 154(H) 186(H) 187(H)  BUN 6 - 20 mg/dL $Remove'12 13 12  'qhNrYoy$ Creatinine 0.61 - 1.24 mg/dL 1.34(H) 1.18 1.19  Sodium 135 - 145 mmol/L 138 137 139  Potassium 3.5 - 5.1 mmol/L 3.8 4.0 4.0  Chloride 98 - 111 mmol/L 106 104 104  CO2 22 - 32 mmol/L $RemoveB'22 23 26  'isTtqZHO$ Calcium 8.9 - 10.3 mg/dL 8.6(L) 8.8(L) 9.3  Total Protein 6.5 - 8.1 g/dL 6.4(L) 6.7 7.0  Total Bilirubin 0.3 - 1.2 mg/dL 0.4 0.3 0.4  Alkaline Phos 38 - 126 U/L 111 116 113  AST 15 - 41 U/L $Remo'22 18 20  'VErmk$ ALT 0 - 44 U/L 33 28 43      RADIOGRAPHIC STUDIES: I have personally reviewed the radiological images as listed and agreed with the findings in the report. No results found.    No orders of the defined types were placed in this encounter.  All questions were answered. The patient knows to call the clinic with any problems, questions or concerns. No barriers to learning was  detected. The total time spent in the appointment was 30 minutes.     Truitt Merle, MD 12/04/2021   I, Wilburn Mylar, am acting as scribe for Truitt Merle, MD.   I have reviewed the above documentation for accuracy and completeness, and I agree with the above.

## 2021-12-04 NOTE — Progress Notes (Signed)
Per Dr. Burr Medico, ok to treat with ANC 1.2.  Approximately 1 hour into oxaliplatin infusion, patient began to complain of burning at IV site. Oxaliplatin paused, IV site assessed and was clean, dry, intact with no swelling/redness. Blood return noted. Heat pack applied and patient stated pain resolved for the most part. Towards the end of infusion, patient complained of burning again. IV assessed, blood return noted and site was unchanged from last assessment. Heat pack applied. Patient continued through remainder of infusion and tolerated well. IV site assessed with no changes upon IV removal. Patient was instructed to keep heat on the site and to call [facility] if the pain does not resolve.

## 2021-12-04 NOTE — Patient Instructions (Signed)
[facility]  Discharge Instructions: Thank you for choosing [facility] to provide your oncology and hematology care.   If you have a lab appointment with the Cancer Center, please go directly to the Cancer Center and check in at the registration area.   Wear comfortable clothing and clothing appropriate for easy access to any Portacath or PICC line.   We strive to give you quality time with your provider. You may need to reschedule your appointment if you arrive late (15 or more minutes).  Arriving late affects you and other patients whose appointments are after yours.  Also, if you miss three or more appointments without notifying the office, you may be dismissed from the clinic at the provider's discretion.      For prescription refill requests, have your pharmacy contact our office and allow 72 hours for refills to be completed.    Today you received the following chemotherapy and/or immunotherapy agents: Oxaliplatin.       To help prevent nausea and vomiting after your treatment, we encourage you to take your nausea medication as directed.  BELOW ARE SYMPTOMS THAT SHOULD BE REPORTED IMMEDIATELY: *FEVER GREATER THAN 100.4 F (38 C) OR HIGHER *CHILLS OR SWEATING *NAUSEA AND VOMITING THAT IS NOT CONTROLLED WITH YOUR NAUSEA MEDICATION *UNUSUAL SHORTNESS OF BREATH *UNUSUAL BRUISING OR BLEEDING *URINARY PROBLEMS (pain or burning when urinating, or frequent urination) *BOWEL PROBLEMS (unusual diarrhea, constipation, pain near the anus) TENDERNESS IN MOUTH AND THROAT WITH OR WITHOUT PRESENCE OF ULCERS (sore throat, sores in mouth, or a toothache) UNUSUAL RASH, SWELLING OR PAIN  UNUSUAL VAGINAL DISCHARGE OR ITCHING   Items with * indicate a potential emergency and should be followed up as soon as possible or go to the Emergency Department if any problems should occur.  Please show the CHEMOTHERAPY ALERT CARD or IMMUNOTHERAPY ALERT CARD at check-in  to the Emergency Department and triage nurse.  Should you have questions after your visit or need to cancel or reschedule your appointment, please contact [facility]  Dept: [phone]  and follow the prompts.  Office hours are 8:00 a.m. to 4:30 p.m. Monday - Friday. Please note that voicemails left after 4:00 p.m. may not be returned until the following business day.  We are closed weekends and major holidays. You have access to a nurse at all times for urgent questions. Please call the main number to the clinic Dept: [phone] and follow the prompts.   For any non-urgent questions, you may also contact your provider using MyChart. We now offer e-Visits for anyone 18 and older to request care online for non-urgent symptoms. For details visit [email or web address].   Also download the MyChart app! Go to the app store, search "MyChart", open the app, select [facility], and log in with your MyChart username and password.  Due to Covid, a mask is required upon entering the hospital/clinic. If you do not have a mask, one will be given to you upon arrival. For doctor visits, patients may have 1 support person aged 18 or older with them. For treatment visits, patients cannot have anyone with them due to current Covid guidelines and our immunocompromised population.   

## 2021-12-04 NOTE — Progress Notes (Addendum)
Per Dr. Burr Medico, "OK To Treat w/ANC 1.2 today".

## 2021-12-04 NOTE — Telephone Encounter (Signed)
Scheduled follow-up appointment per 12/9 los. Patient's wife is aware.

## 2021-12-09 ENCOUNTER — Other Ambulatory Visit: Payer: Self-pay | Admitting: Hematology

## 2021-12-25 ENCOUNTER — Inpatient Hospital Stay: Payer: BC Managed Care – PPO

## 2021-12-25 ENCOUNTER — Other Ambulatory Visit: Payer: Self-pay

## 2021-12-25 ENCOUNTER — Inpatient Hospital Stay: Payer: BC Managed Care – PPO | Admitting: Hematology

## 2021-12-25 ENCOUNTER — Encounter: Payer: Self-pay | Admitting: Hematology

## 2021-12-25 VITALS — BP 148/97 | HR 84 | Temp 98.7°F | Resp 16 | Wt 184.0 lb

## 2021-12-25 DIAGNOSIS — C20 Malignant neoplasm of rectum: Secondary | ICD-10-CM

## 2021-12-25 DIAGNOSIS — Z5111 Encounter for antineoplastic chemotherapy: Secondary | ICD-10-CM | POA: Diagnosis not present

## 2021-12-25 LAB — CBC WITH DIFFERENTIAL (CANCER CENTER ONLY)
Abs Immature Granulocytes: 0.01 10*3/uL (ref 0.00–0.07)
Basophils Absolute: 0.1 10*3/uL (ref 0.0–0.1)
Basophils Relative: 1 %
Eosinophils Absolute: 0.2 10*3/uL (ref 0.0–0.5)
Eosinophils Relative: 5 %
HCT: 36.9 % — ABNORMAL LOW (ref 39.0–52.0)
Hemoglobin: 12.5 g/dL — ABNORMAL LOW (ref 13.0–17.0)
Immature Granulocytes: 0 %
Lymphocytes Relative: 45 %
Lymphs Abs: 1.8 10*3/uL (ref 0.7–4.0)
MCH: 27.4 pg (ref 26.0–34.0)
MCHC: 33.9 g/dL (ref 30.0–36.0)
MCV: 80.7 fL (ref 80.0–100.0)
Monocytes Absolute: 0.5 10*3/uL (ref 0.1–1.0)
Monocytes Relative: 14 %
Neutro Abs: 1.4 10*3/uL — ABNORMAL LOW (ref 1.7–7.7)
Neutrophils Relative %: 35 %
Platelet Count: 236 10*3/uL (ref 150–400)
RBC: 4.57 MIL/uL (ref 4.22–5.81)
RDW: 18.3 % — ABNORMAL HIGH (ref 11.5–15.5)
WBC Count: 3.9 10*3/uL — ABNORMAL LOW (ref 4.0–10.5)
nRBC: 0 % (ref 0.0–0.2)

## 2021-12-25 LAB — CMP (CANCER CENTER ONLY)
ALT: 31 U/L (ref 0–44)
AST: 31 U/L (ref 15–41)
Albumin: 3.9 g/dL (ref 3.5–5.0)
Alkaline Phosphatase: 86 U/L (ref 38–126)
Anion gap: 8 (ref 5–15)
BUN: 15 mg/dL (ref 6–20)
CO2: 26 mmol/L (ref 22–32)
Calcium: 9.2 mg/dL (ref 8.9–10.3)
Chloride: 102 mmol/L (ref 98–111)
Creatinine: 1.14 mg/dL (ref 0.61–1.24)
GFR, Estimated: >60 mL/min (ref >60)
Glucose, Bld: 224 mg/dL — ABNORMAL HIGH (ref 70–99)
Potassium: 3.9 mmol/L (ref 3.5–5.1)
Sodium: 136 mmol/L (ref 135–145)
Total Bilirubin: 0.5 mg/dL (ref 0.3–1.2)
Total Protein: 6.6 g/dL (ref 6.5–8.1)

## 2021-12-25 LAB — CEA (IN HOUSE-CHCC): CEA (CHCC-In House): 4.55 ng/mL (ref 0.00–5.00)

## 2021-12-25 MED ORDER — SODIUM CHLORIDE 0.9 % IV SOLN
10.0000 mg | Freq: Once | INTRAVENOUS | Status: AC
  Administered 2021-12-25: 13:00:00 10 mg via INTRAVENOUS
  Filled 2021-12-25: qty 10

## 2021-12-25 MED ORDER — OXALIPLATIN CHEMO INJECTION 100 MG/20ML
130.0000 mg/m2 | Freq: Once | INTRAVENOUS | Status: AC
  Administered 2021-12-25: 14:00:00 265 mg via INTRAVENOUS
  Filled 2021-12-25: qty 53

## 2021-12-25 MED ORDER — PALONOSETRON HCL INJECTION 0.25 MG/5ML
0.2500 mg | Freq: Once | INTRAVENOUS | Status: AC
  Administered 2021-12-25: 13:00:00 0.25 mg via INTRAVENOUS
  Filled 2021-12-25: qty 5

## 2021-12-25 MED ORDER — OXALIPLATIN CHEMO INJECTION 100 MG/20ML: 130.0000 mg/m2 | Freq: Once | INTRAVENOUS | Status: DC

## 2021-12-25 MED ORDER — DEXTROSE 5 % IV SOLN: Freq: Once | INTRAVENOUS | Status: AC

## 2021-12-25 NOTE — Progress Notes (Signed)
[place]   Telephone:(336) [phone] Fax:(336) [phone]   Clinic Follow up Note   Patient Care Team: Tysinger, Camelia Eng, PA-C as PCP - General (Family Medicine) Ileana Roup, MD as Consulting Physician (General Surgery) Truitt Merle, MD as Consulting Physician (Hematology) Kyung Rudd, MD as Consulting Physician (Radiation Oncology) Alla Feeling, NP as Nurse Practitioner (Nurse Practitioner) Royston Bake, RN as Registered Nurse  Date of Service:  12/25/2021  CHIEF COMPLAINT: f/u of rectal cancer  CURRENT THERAPY:  Capox q3weeks, starting 10/23/21  ASSESSMENT & PLAN:  Bryan [name] is a 50 y.o. male with   1. Adenocarcinoma of the rectum, MMR normal, cT3b N1 M0 -He presented with anorectal pain and intermittent bleeding, work-up showed a 4 cm mass in the distal rectum, path confirmed invasive adenocarcinoma.  Staging work-up is concerning for extension into the mesorectum and possible local lymphadenopathy.   -Baseline CEA normal 3.9 -re-read of staging pelvic MRI on 10/13/21 showed stage T3b, N1 disease, with contact with internal sphincter. -he began neoadjuvant chemo with Capox on 10/23/21. He has tolerated relatively well with some nausea and cold sensitivity. No peripheral neuropathy yet  -labs reviewed, hgb stable at 12.5, BG elevated to 224. Overall adequate to proceed with C4 today. He will start Xeloda tonight (he works night shift)   2. Anorectal pain and bleeding -secondary to #1 -he knows to use tylenol for pain and miralax for constipation. -his hgb has dropped slightly to 12.6 today. I encouraged him to eat more red meat or take an oral iron supplement.   3. Genetics -Patient has strong family history of breast cancer and one relative with prostate cancer.  -Given his personal and family history of cancer he qualifies for genetic testing and is interested.   -He has 3 children who are aware of his diagnosis and their potential risks -his  rectal tumor is MMR normal, likely not Lynch syndrome -Referral placed at consultation on 10/14/21     PLAN: -proceed with C4 oxaliplatin today -start C4 Xeloda tonight for 14 days  -lab, f/u, and oxaliplatin in 3 and 6 weeks   No problem-specific Assessment & Plan notes found for this encounter.   SUMMARY OF ONCOLOGIC HISTORY: Oncology History  Rectal cancer ([place])  08/18/2021 Initial Diagnosis   Rectal cancer ([place])   09/14/2021 Procedure   Colonoscopy by Dr. Dustin Flock -The perianal examination was normal. - The digital rectal exam revealed a firm rectal mass palpated 1-2 cm from the anal verge. The mass was non-circumferential and located predominantly at the left bowel wall. - An ulcerated non-obstructing medium-sized friable mass was found in the distal rectum. The mass was non-circumferential. The mass measured three cm in length. No bleeding was present. Biopsies (5 passes total for 10 total biopsy specimens)were taken with a cold forceps for histology. Estimated blood loss was minimal. - The exam was otherwise normal throughout the examined colon. - The terminal ileum appeared normal. - No additional abnormalities were found on retroflexion   09/14/2021 Initial Biopsy   Diagnosis Rectum, biopsy, mass - INVASIVE ADENOCARCINOMA.  MMR normal  Baseline CEA 3.9     09/17/2021 Imaging   CT CAP IMPRESSION: 1. Known left rectal mass measures approximately 3.3 x 1.9 x 4.1 cm and is associated with asymmetric haziness in the left perirectal fat and small left perirectal lymph nodes. 2. No findings highly suspicious for distant metastatic disease. There are 2 small indeterminate low-density hepatic lesions. Consider MRI for further characterization. 3.  No suspicious findings in the chest. Small perifissural nodules bilaterally, likely benign.   09/25/2021 Imaging      Outside liver and pelvic MRI   10/13/2021 Cancer Staging   Staging form: Colon and Rectum, AJCC 8th  Edition - Clinical stage from 10/13/2021: Stage IIIB (cT3, cN1, cM0) - Signed by Truitt Merle, MD on 10/23/2021 Stage prefix: Initial diagnosis    10/23/2021 -  Chemotherapy   Patient is on Treatment Plan : RECTAL Xelox (Capeox) q21d x 6 cycles        INTERVAL HISTORY:  Bryan [name] is here for a follow up of rectal cancer. He was last seen by me on 12/04/21. He was seen in the infusion area. He reports he has some cold sensitivity to his fingers and lips, as well as chills, which resolve with time.   All other systems were reviewed with the patient and are negative.  MEDICAL HISTORY:  Past Medical History:  Diagnosis Date   Allergic rhinitis, cause unspecified    spring only   Hypercholesteremia    Rectal cancer ([place]) 09/14/2021    SURGICAL HISTORY: Past Surgical History:  Procedure Laterality Date   None      I have reviewed the social history and family history with the patient and they are unchanged from previous note.  ALLERGIES:  has No Known Allergies.  MEDICATIONS:  Current Outpatient Medications  Medication Sig Dispense Refill   capecitabine (XELODA) 500 MG tablet TAKE 3 TABLETS BY MOUTH EVERY MORNING AND 4 TABLETS EVERY EVENING FOR 14 DAYS ON THEN 7 DAYS OFF. 294 tablet 0   hydrocortisone 2.5 % cream Apply topically 2 (two) times daily. (Patient not taking: No sig reported) 30 g 1   ibuprofen (ADVIL) 800 MG tablet Take 1 tablet (800 mg total) by mouth 2 (two) times daily as needed for moderate pain. Take with food. Follow up with PCP for refills 30 tablet 1   ondansetron (ZOFRAN) 8 MG tablet Take 1 tablet (8 mg total) by mouth 2 (two) times daily as needed for refractory nausea / vomiting. Start on day 3 after chemotherapy. 30 tablet 1   pantoprazole (PROTONIX) 20 MG tablet Take 1 tablet (20 mg total) by mouth daily. 30 tablet 2   prochlorperazine (COMPAZINE) 10 MG tablet Take 1 tablet (10 mg total) by mouth every 6 (six) hours as needed (Nausea or vomiting). 30 tablet  1   No current facility-administered medications for this visit.   Facility-Administered Medications Ordered in Other Visits  Medication Dose Route Frequency Provider Last Rate Last Admin   oxaliplatin (ELOXATIN) 265 mg in dextrose 5 % 1,000 mL chemo infusion  130 mg/m2 (Treatment Plan Recorded) Intravenous Once Truitt Merle, MD 527 mL/hr at 12/25/21 1343 265 mg at 12/25/21 1343    PHYSICAL EXAMINATION: ECOG PERFORMANCE STATUS: 1 - Symptomatic but completely ambulatory  There were no vitals filed for this visit. Wt Readings from Last 3 Encounters:  12/25/21 184 lb (83.5 kg)  12/04/21 183 lb 1.6 oz (83.1 kg)  11/13/21 187 lb 4.8 oz (85 kg)     GENERAL:alert, no distress and comfortable SKIN: skin color normal, no rashes or significant lesions EYES: normal, Conjunctiva are pink and non-injected, sclera clear  NEURO: alert & oriented x 3 with fluent speech  LABORATORY DATA:  I have reviewed the data as listed CBC Latest Ref Rng & Units 12/25/2021 12/04/2021 11/13/2021  WBC 4.0 - 10.5 K/uL 3.9(L) 3.2(L) 4.4  Hemoglobin 13.0 - 17.0 g/dL 12.5(L) 12.6(L) 13.6  Hematocrit 39.0 - 52.0 % 36.9(L) 37.4(L) 41.0  Platelets 150 - 400 K/uL 236 215 250     CMP Latest Ref Rng & Units 12/25/2021 12/04/2021 11/13/2021  Glucose 70 - 99 mg/dL 224(H) 154(H) 186(H)  BUN 6 - 20 mg/dL $Remove'15 12 13  'jBvhKKT$ Creatinine 0.61 - 1.24 mg/dL 1.14 1.34(H) 1.18  Sodium 135 - 145 mmol/L 136 138 137  Potassium 3.5 - 5.1 mmol/L 3.9 3.8 4.0  Chloride 98 - 111 mmol/L 102 106 104  CO2 22 - 32 mmol/L $RemoveB'26 22 23  'aYpEpZNW$ Calcium 8.9 - 10.3 mg/dL 9.2 8.6(L) 8.8(L)  Total Protein 6.5 - 8.1 g/dL 6.6 6.4(L) 6.7  Total Bilirubin 0.3 - 1.2 mg/dL 0.5 0.4 0.3  Alkaline Phos 38 - 126 U/L 86 111 116  AST 15 - 41 U/L $Remo'31 22 18  'gOpQw$ ALT 0 - 44 U/L 31 33 28      RADIOGRAPHIC STUDIES: I have personally reviewed the radiological images as listed and agreed with the findings in the report. No results found.    No orders of the defined types were  placed in this encounter.  All questions were answered. The patient knows to call the clinic with any problems, questions or concerns. No barriers to learning was detected. The total time spent in the appointment was 30 minutes.     Truitt Merle, MD 12/25/2021   I, Wilburn Mylar, am acting as scribe for Truitt Merle, MD.   I have reviewed the above documentation for accuracy and completeness, and I agree with the above.

## 2021-12-25 NOTE — Progress Notes (Signed)
Dilute oxaliplatin in 1000 ml Dextrose due to peripheral IV burning with last infusion.  T.O. Dr Lavonda Jumbo, PharmD

## 2021-12-25 NOTE — Progress Notes (Signed)
Per Dr. Burr Medico, "OK To treat w/ANC 1.4 today"

## 2021-12-25 NOTE — Patient Instructions (Signed)
[facility]COL[facility]  Discharge Instructions: Thank you for choosing [facility] to provide your oncology and hematology care.   If you have a lab appointment with the Cancer Center, please go directly to the Cancer Center and check in at the registration area.   Wear comfortable clothing and clothing appropriate for easy access to any Portacath or PICC line.   We strive to give you quality time with your provider. You may need to reschedule your appointment if you arrive late (15 or more minutes).  Arriving late affects you and other patients whose appointments are after yours.  Also, if you miss three or more appointments without notifying the office, you may be dismissed from the clinic at the provider's discretion.      For prescription refill requests, have your pharmacy contact our office and allow 72 hours for refills to be completed.    Today you received the following chemotherapy and/or immunotherapy agents: Oxaliplatin.       To help prevent nausea and vomiting after your treatment, we encourage you to take your nausea medication as directed.  BELOW ARE SYMPTOMS THAT SHOULD BE REPORTED IMMEDIATELY: *FEVER GREATER THAN 100.4 F (38 C) OR HIGHER *CHILLS OR SWEATING *NAUSEA AND VOMITING THAT IS NOT CONTROLLED WITH YOUR NAUSEA MEDICATION *UNUSUAL SHORTNESS OF BREATH *UNUSUAL BRUISING OR BLEEDING *URINARY PROBLEMS (pain or burning when urinating, or frequent urination) *BOWEL PROBLEMS (unusual diarrhea, constipation, pain near the anus) TENDERNESS IN MOUTH AND THROAT WITH OR WITHOUT PRESENCE OF ULCERS (sore throat, sores in mouth, or a toothache) UNUSUAL RASH, SWELLING OR PAIN  UNUSUAL VAGINAL DISCHARGE OR ITCHING   Items with * indicate a potential emergency and should be followed up as soon as possible or go to the Emergency Department if any problems should occur.  Please show the CHEMOTHERAPY ALERT CARD or IMMUNOTHERAPY ALERT CARD at check-in  to the Emergency Department and triage nurse.  Should you have questions after your visit or need to cancel or reschedule your appointment, please contact [facility]  Dept: [phone]  and follow the prompts.  Office hours are 8:00 a.m. to 4:30 p.m. Monday - Friday. Please note that voicemails left after 4:00 p.m. may not be returned until the following business day.  We are closed weekends and major holidays. You have access to a nurse at all times for urgent questions. Please call the main number to the clinic Dept: [phone] and follow the prompts.   For any non-urgent questions, you may also contact your provider using MyChart. We now offer e-Visits for anyone 18 and older to request care online for non-urgent symptoms. For details visit [email or web address].   Also download the MyChart app! Go to the app store, search "MyChart", open the app, select [facility], and log in with your MyChart username and password.  Due to Covid, a mask is required upon entering the hospital/clinic. If you do not have a mask, one will be given to you upon arrival. For doctor visits, patients may have 1 support person aged 18 or older with them. For treatment visits, patients cannot have anyone with them due to current Covid guidelines and our immunocompromised population.   

## 2021-12-25 NOTE — Progress Notes (Signed)
Per Dr. Burr Medico, would like to further dilute Oxaliplatin in D5W 1000 mL d/t burning experienced w/ most recent infusion.  Kennith Center, Pharm.D., CPP 12/25/2021@12 :46 PM

## 2022-01-05 ENCOUNTER — Encounter: Payer: Self-pay | Admitting: Hematology

## 2022-01-14 MED FILL — Dexamethasone Sodium Phosphate Inj 100 MG/10ML: INTRAMUSCULAR | Qty: 1 | Status: AC

## 2022-01-15 ENCOUNTER — Ambulatory Visit: Payer: BC Managed Care – PPO | Admitting: Hematology

## 2022-01-15 ENCOUNTER — Inpatient Hospital Stay: Payer: BC Managed Care – PPO | Admitting: Hematology

## 2022-01-15 ENCOUNTER — Other Ambulatory Visit: Payer: Self-pay

## 2022-01-15 ENCOUNTER — Other Ambulatory Visit: Payer: BC Managed Care – PPO

## 2022-01-15 ENCOUNTER — Inpatient Hospital Stay: Payer: BC Managed Care – PPO

## 2022-01-15 ENCOUNTER — Inpatient Hospital Stay: Payer: BC Managed Care – PPO | Attending: Nurse Practitioner

## 2022-01-15 ENCOUNTER — Ambulatory Visit: Payer: BC Managed Care – PPO

## 2022-01-15 VITALS — BP 140/83 | HR 82 | Temp 98.7°F | Resp 18 | Ht 70.0 in | Wt 183.0 lb

## 2022-01-15 DIAGNOSIS — C20 Malignant neoplasm of rectum: Secondary | ICD-10-CM | POA: Insufficient documentation

## 2022-01-15 DIAGNOSIS — K625 Hemorrhage of anus and rectum: Secondary | ICD-10-CM | POA: Insufficient documentation

## 2022-01-15 DIAGNOSIS — Z5111 Encounter for antineoplastic chemotherapy: Secondary | ICD-10-CM | POA: Insufficient documentation

## 2022-01-15 DIAGNOSIS — G893 Neoplasm related pain (acute) (chronic): Secondary | ICD-10-CM | POA: Insufficient documentation

## 2022-01-15 LAB — CBC WITH DIFFERENTIAL (CANCER CENTER ONLY)
Abs Immature Granulocytes: 0.01 10*3/uL (ref 0.00–0.07)
Basophils Absolute: 0 10*3/uL (ref 0.0–0.1)
Basophils Relative: 1 %
Eosinophils Absolute: 0.1 10*3/uL (ref 0.0–0.5)
Eosinophils Relative: 3 %
HCT: 36.5 % — ABNORMAL LOW (ref 39.0–52.0)
Hemoglobin: 13.1 g/dL (ref 13.0–17.0)
Immature Granulocytes: 0 %
Lymphocytes Relative: 33 %
Lymphs Abs: 1.5 10*3/uL (ref 0.7–4.0)
MCH: 28.9 pg (ref 26.0–34.0)
MCHC: 35.9 g/dL (ref 30.0–36.0)
MCV: 80.4 fL (ref 80.0–100.0)
Monocytes Absolute: 0.6 10*3/uL (ref 0.1–1.0)
Monocytes Relative: 14 %
Neutro Abs: 2.2 10*3/uL (ref 1.7–7.7)
Neutrophils Relative %: 49 %
Platelet Count: 144 10*3/uL — ABNORMAL LOW (ref 150–400)
RBC: 4.54 MIL/uL (ref 4.22–5.81)
RDW: 19.4 % — ABNORMAL HIGH (ref 11.5–15.5)
WBC Count: 4.4 10*3/uL (ref 4.0–10.5)
nRBC: 0 % (ref 0.0–0.2)

## 2022-01-15 LAB — CMP (CANCER CENTER ONLY)
ALT: 31 U/L (ref 0–44)
AST: 21 U/L (ref 15–41)
Albumin: 4.2 g/dL (ref 3.5–5.0)
Alkaline Phosphatase: 97 U/L (ref 38–126)
Anion gap: 9 (ref 5–15)
BUN: 15 mg/dL (ref 6–20)
CO2: 27 mmol/L (ref 22–32)
Calcium: 9.5 mg/dL (ref 8.9–10.3)
Chloride: 101 mmol/L (ref 98–111)
Creatinine: 1.17 mg/dL (ref 0.61–1.24)
GFR, Estimated: >60 mL/min (ref >60)
Glucose, Bld: 160 mg/dL — ABNORMAL HIGH (ref 70–99)
Potassium: 3.8 mmol/L (ref 3.5–5.1)
Sodium: 137 mmol/L (ref 135–145)
Total Bilirubin: 0.7 mg/dL (ref 0.3–1.2)
Total Protein: 7 g/dL (ref 6.5–8.1)

## 2022-01-15 LAB — FERRITIN: Ferritin: 455 ng/mL — ABNORMAL HIGH (ref 24–336)

## 2022-01-15 LAB — CEA (IN HOUSE-CHCC): CEA (CHCC-In House): 6.03 ng/mL — ABNORMAL HIGH (ref 0.00–5.00)

## 2022-01-15 MED ORDER — DEXTROSE 5 % IV SOLN: Freq: Once | INTRAVENOUS | Status: AC

## 2022-01-15 MED ORDER — SODIUM CHLORIDE 0.9 % IV SOLN
10.0000 mg | Freq: Once | INTRAVENOUS | Status: AC
  Administered 2022-01-15: 10 mg via INTRAVENOUS
  Filled 2022-01-15: qty 10

## 2022-01-15 MED ORDER — OXALIPLATIN CHEMO INJECTION 100 MG/20ML
130.0000 mg/m2 | Freq: Once | INTRAVENOUS | Status: AC
  Administered 2022-01-15: 265 mg via INTRAVENOUS
  Filled 2022-01-15: qty 53

## 2022-01-15 MED ORDER — PALONOSETRON HCL INJECTION 0.25 MG/5ML
0.2500 mg | Freq: Once | INTRAVENOUS | Status: AC
  Administered 2022-01-15: 0.25 mg via INTRAVENOUS
  Filled 2022-01-15: qty 5

## 2022-01-15 NOTE — Patient Instructions (Signed)
[facility]  Discharge Instructions: Thank you for choosing [facility] to provide your oncology and hematology care.   If you have a lab appointment with the Cancer Center, please go directly to the Cancer Center and check in at the registration area.   Wear comfortable clothing and clothing appropriate for easy access to any Portacath or PICC line.   We strive to give you quality time with your provider. You may need to reschedule your appointment if you arrive late (15 or more minutes).  Arriving late affects you and other patients whose appointments are after yours.  Also, if you miss three or more appointments without notifying the office, you may be dismissed from the clinic at the provider's discretion.      For prescription refill requests, have your pharmacy contact our office and allow 72 hours for refills to be completed.    Today you received the following chemotherapy and/or immunotherapy agents: Oxaliplatin.       To help prevent nausea and vomiting after your treatment, we encourage you to take your nausea medication as directed.  BELOW ARE SYMPTOMS THAT SHOULD BE REPORTED IMMEDIATELY: *FEVER GREATER THAN 100.4 F (38 C) OR HIGHER *CHILLS OR SWEATING *NAUSEA AND VOMITING THAT IS NOT CONTROLLED WITH YOUR NAUSEA MEDICATION *UNUSUAL SHORTNESS OF BREATH *UNUSUAL BRUISING OR BLEEDING *URINARY PROBLEMS (pain or burning when urinating, or frequent urination) *BOWEL PROBLEMS (unusual diarrhea, constipation, pain near the anus) TENDERNESS IN MOUTH AND THROAT WITH OR WITHOUT PRESENCE OF ULCERS (sore throat, sores in mouth, or a toothache) UNUSUAL RASH, SWELLING OR PAIN  UNUSUAL VAGINAL DISCHARGE OR ITCHING   Items with * indicate a potential emergency and should be followed up as soon as possible or go to the Emergency Department if any problems should occur.  Please show the CHEMOTHERAPY ALERT CARD or IMMUNOTHERAPY ALERT CARD at check-in  to the Emergency Department and triage nurse.  Should you have questions after your visit or need to cancel or reschedule your appointment, please contact [facility]  Dept: [phone]  and follow the prompts.  Office hours are 8:00 a.m. to 4:30 p.m. Monday - Friday. Please note that voicemails left after 4:00 p.m. may not be returned until the following business day.  We are closed weekends and major holidays. You have access to a nurse at all times for urgent questions. Please call the main number to the clinic Dept: [phone] and follow the prompts.   For any non-urgent questions, you may also contact your provider using MyChart. We now offer e-Visits for anyone 18 and older to request care online for non-urgent symptoms. For details visit [email or web address].   Also download the MyChart app! Go to the app store, search "MyChart", open the app, select [facility], and log in with your MyChart username and password.  Due to Covid, a mask is required upon entering the hospital/clinic. If you do not have a mask, one will be given to you upon arrival. For doctor visits, patients may have 1 support person aged 18 or older with them. For treatment visits, patients cannot have anyone with them due to current Covid guidelines and our immunocompromised population.   

## 2022-01-15 NOTE — Progress Notes (Signed)
[name]   Telephone:(336) [phone] Fax:(336) [phone]   Clinic Follow up Note   Patient Care Team: Tysinger, Camelia Eng, PA-C as PCP - General (Family Medicine) Ileana Roup, MD as Consulting Physician (General Surgery) Truitt Merle, MD as Consulting Physician (Hematology) Kyung Rudd, MD as Consulting Physician (Radiation Oncology) Alla Feeling, NP as Nurse Practitioner (Nurse Practitioner) Royston Bake, RN as Registered Nurse  Date of Service:  01/15/2022  CHIEF COMPLAINT: f/u of rectal cancer  CURRENT THERAPY:  Capox q3weeks, starting 10/23/21  ASSESSMENT & PLAN:  Bryan [name] is a 51 y.o. male with   1. Adenocarcinoma of the rectum, MMR normal, cT3b N1 M0 -He presented with anorectal pain and intermittent bleeding, work-up showed a 4 cm mass in the distal rectum, path confirmed invasive adenocarcinoma.  Staging work-up is concerning for extension into the mesorectum and possible local lymphadenopathy.   -Baseline CEA normal 3.9 -re-read of staging pelvic MRI on 10/13/21 showed stage T3b, N1 disease, with contact with internal sphincter. -he began neoadjuvant chemo with Capox on 10/23/21. He has tolerated relatively well with some nausea and cold sensitivity. No peripheral neuropathy yet  -labs reviewed, hgb improved to 13.1, plt 144k. Overall adequate to proceed with C5 today. He will start Xeloda tonight (he works night shift) -they had questions about radiation, and wondered if he could receive treatment closer [facility] at [facility]. They will explore this option with [facility]. Otherwise, he is scheduled for CT simulation on 01/28/22; I feel this is too early, so I will ask them to push it back.   2. Anorectal pain and bleeding -secondary to #1 -he knows to use tylenol for pain and miralax for constipation.  -resolved now   3. Genetics -Patient has strong family history of breast cancer and one relative with prostate cancer.  -Given his personal and  family history of cancer he qualifies for genetic testing and is interested.   -He has 3 children who are aware of his diagnosis and their potential risks -his rectal tumor is MMR normal, likely not Lynch syndrome -Referral placed at consultation on 10/14/21     PLAN: -proceed with C5 oxaliplatin today -start C5 Xeloda tonight for 14 days  -lab, f/u, and final cycle oxaliplatin in 3 weeks -will reach out to Dr. Lisbeth Renshaw and he would like to have RT at [facility]     No problem-specific [facility] notes found for this encounter.   SUMMARY OF ONCOLOGIC HISTORY: Oncology History  Rectal cancer ([name])  08/18/2021 Initial Diagnosis   Rectal cancer ([name])   09/14/2021 Procedure   Colonoscopy by Dr. Dustin Flock -The perianal examination was normal. - The digital rectal exam revealed a firm rectal mass palpated 1-2 cm from the anal verge. The mass was non-circumferential and located predominantly at the left bowel wall. - An ulcerated non-obstructing medium-sized friable mass was found in the distal rectum. The mass was non-circumferential. The mass measured three cm in length. No bleeding was present. Biopsies (5 passes total for 10 total biopsy specimens)were taken with a cold forceps for histology. Estimated blood loss was minimal. - The exam was otherwise normal throughout the examined colon. - The terminal ileum appeared normal. - No additional abnormalities were found on retroflexion   09/14/2021 Initial Biopsy   Diagnosis Rectum, biopsy, mass - INVASIVE ADENOCARCINOMA.  MMR normal  Baseline CEA 3.9     09/17/2021 Imaging   CT CAP IMPRESSION: 1. Known left rectal mass measures approximately 3.3  x 1.9 x 4.1 cm and is associated with asymmetric haziness in the left perirectal fat and small left perirectal lymph nodes. 2. No findings highly suspicious for distant metastatic disease. There are 2 small indeterminate low-density hepatic lesions. Consider MRI for further  characterization. 3. No suspicious findings in the chest. Small perifissural nodules bilaterally, likely benign.   09/25/2021 Imaging      Outside liver and pelvic MRI   10/13/2021 Cancer Staging   Staging form: Colon and Rectum, AJCC 8th Edition - Clinical stage from 10/13/2021: Stage IIIB (cT3, cN1, cM0) - Signed by Truitt Merle, MD on 10/23/2021 Stage prefix: Initial diagnosis    10/23/2021 -  Chemotherapy   Patient is on Treatment Plan : RECTAL Xelox (Capeox) q21d x 6 cycles        INTERVAL HISTORY:  Bryan [name] is here for a follow up of rectal cancer. He was last seen by me on 12/25/21. He presents to the clinic accompanied by his wife.   All other systems were reviewed with the patient and are negative.  MEDICAL HISTORY:  Past Medical History:  Diagnosis Date   Allergic rhinitis, cause unspecified    spring only   Hypercholesteremia    Rectal cancer ([name]) 09/14/2021    SURGICAL HISTORY: Past Surgical History:  Procedure Laterality Date   None      I have reviewed the social history and family history with the patient and they are unchanged from previous note.  ALLERGIES:  has No Known Allergies.  MEDICATIONS:  Current Outpatient Medications  Medication Sig Dispense Refill   capecitabine (XELODA) 500 MG tablet TAKE 3 TABLETS BY MOUTH EVERY MORNING AND 4 TABLETS EVERY EVENING FOR 14 DAYS ON THEN 7 DAYS OFF. 294 tablet 0   hydrocortisone 2.5 % cream Apply topically 2 (two) times daily. (Patient not taking: No sig reported) 30 g 1   ibuprofen (ADVIL) 800 MG tablet Take 1 tablet (800 mg total) by mouth 2 (two) times daily as needed for moderate pain. Take with food. Follow up with PCP for refills 30 tablet 1   ondansetron (ZOFRAN) 8 MG tablet Take 1 tablet (8 mg total) by mouth 2 (two) times daily as needed for refractory nausea / vomiting. Start on day 3 after chemotherapy. 30 tablet 1   pantoprazole (PROTONIX) 20 MG tablet Take 1 tablet (20 mg total) by mouth daily.  30 tablet 2   prochlorperazine (COMPAZINE) 10 MG tablet Take 1 tablet (10 mg total) by mouth every 6 (six) hours as needed (Nausea or vomiting). 30 tablet 1   No current facility-administered medications for this visit.    PHYSICAL EXAMINATION: ECOG PERFORMANCE STATUS: 1 - Symptomatic but completely ambulatory  There were no vitals filed for this visit. Wt Readings from Last 3 Encounters:  12/25/21 184 lb (83.5 kg)  12/04/21 183 lb 1.6 oz (83.1 kg)  11/13/21 187 lb 4.8 oz (85 kg)     GENERAL:alert, no distress and comfortable SKIN: skin color normal, no rashes or significant lesions EYES: normal, Conjunctiva are pink and non-injected, sclera clear  NEURO: alert & oriented x 3 with fluent speech  LABORATORY DATA:  I have reviewed the data as listed CBC Latest Ref Rng & Units 12/25/2021 12/04/2021 11/13/2021  WBC 4.0 - 10.5 K/uL 3.9(L) 3.2(L) 4.4  Hemoglobin 13.0 - 17.0 g/dL 12.5(L) 12.6(L) 13.6  Hematocrit 39.0 - 52.0 % 36.9(L) 37.4(L) 41.0  Platelets 150 - 400 K/uL 236 215 250     CMP Latest Ref Rng &  Units 12/25/2021 12/04/2021 11/13/2021  Glucose 70 - 99 mg/dL 224(H) 154(H) 186(H)  BUN 6 - 20 mg/dL _0 Creatinine 0.61 - 1.24 mg/dL 1.14 1.34(H) 1.18  Sodium 135 - 145 mmol/L 136 138 137  Potassium 3.5 - 5.1 mmol/L 3.9 3.8 4.0  Chloride 98 - 111 mmol/L 102 106 104  CO2 22 - 32 mmol/L _1 Calcium 8.9 - 10.3 mg/dL 9.2 8.6(L) 8.8(L)  Total Protein 6.5 - 8.1 g/dL 6.6 6.4(L) 6.7  Total Bilirubin 0.3 - 1.2 mg/dL 0.5 0.4 0.3  Alkaline Phos 38 - 126 U/L 86 111 116  AST 15 - 41 U/L _2 ALT 0 - 44 U/L 31 33 28      RADIOGRAPHIC STUDIES: I have personally reviewed the radiological images as listed and agreed with the findings in the report. No results found.    No orders of the defined types were placed in this encounter.  All questions were answered. The patient knows to call the clinic with any problems, questions or concerns. No barriers to learning was  detected. The total time spent in the appointment was 30 minutes.     Truitt Merle, MD 01/15/2022   I, Wilburn Mylar, am acting as scribe for Truitt Merle, MD.   I have reviewed the above documentation for accuracy and completeness, and I agree with the above.

## 2022-01-16 ENCOUNTER — Encounter: Payer: Self-pay | Admitting: Hematology

## 2022-01-18 ENCOUNTER — Telehealth: Payer: Self-pay | Admitting: Radiation Oncology

## 2022-01-18 ENCOUNTER — Other Ambulatory Visit: Payer: Self-pay

## 2022-01-18 ENCOUNTER — Telehealth: Payer: Self-pay

## 2022-01-18 DIAGNOSIS — C20 Malignant neoplasm of rectum: Secondary | ICD-10-CM

## 2022-01-18 NOTE — Telephone Encounter (Signed)
I spoke with the patient's wife after we had received her quest from Dr. Ernestina Penna team about making a referral to [facility] for radiation.  Patient lives in [place] and is much more close in proximity to the [facility].  He will finish his IV infusion for chemotherapy on 02/05/2022 but his capecitabine concludes at the current dose on 02/18/2022.

## 2022-01-18 NOTE — Telephone Encounter (Signed)
This nurse reached out to patient and spoke with his spouse.  Made aware of lab results mentioned below per provider.  Spouse acknowledged understanding and agreed to come in for lab appointment on 2/2 as well for redraw.  No further questions or concerns mentioned at this time.

## 2022-01-18 NOTE — Telephone Encounter (Signed)
-----   Message from Alla Feeling, NP sent at 01/17/2022 10:11 PM EST ----- Please let pt know his CEA has increased slightly, but now over normal level. This could be benign fluctuation, lab error, etc. I recommend to repeat it when he returns 2/2 for RT appointments. Please send schedule message and order CEA lab.  Thanks, Regan Rakers, NP

## 2022-01-19 ENCOUNTER — Encounter: Payer: Self-pay | Admitting: Radiation Oncology

## 2022-01-19 ENCOUNTER — Telehealth: Payer: Self-pay

## 2022-01-19 ENCOUNTER — Telehealth: Payer: Self-pay | Admitting: Hematology

## 2022-01-19 NOTE — Telephone Encounter (Signed)
Scheduled follow-up appointment per 1/20 los. Patient's wife is aware.

## 2022-01-19 NOTE — Telephone Encounter (Addendum)
Spoke w/ patient, verified identity, and reminded patient of his 2:00pm-01/28/22 "in-person" appointment w/ Shona Simpson PA-C. Patient verbalized understanding of information given. Advised patient to arrive 68min early for check-in. I left my extension [phone] in case patient needs to call. Patient verbalized understanding of information given.

## 2022-01-19 NOTE — Progress Notes (Signed)
Spoke w/ patient, verified identity and begin nursing interview. Patient reports no symptoms at this time.  Meaningful use complete.  Patient notified of all appointment information. I left my extension [phone] in case patient needs to call.  Patient contact [phone]

## 2022-01-25 ENCOUNTER — Encounter: Payer: Self-pay | Admitting: Radiation Oncology

## 2022-01-25 ENCOUNTER — Telehealth: Payer: Self-pay

## 2022-01-25 ENCOUNTER — Other Ambulatory Visit: Payer: Self-pay

## 2022-01-25 ENCOUNTER — Ambulatory Visit
Admission: RE | Admit: 2022-01-25 | Discharge: 2022-01-25 | Disposition: A | Discharge status: Home / Self Care | Payer: BC Managed Care – PPO | Source: Ambulatory Visit | Attending: Radiation Oncology | Admitting: Radiation Oncology

## 2022-01-25 VITALS — BP 127/96 | HR 84 | Temp 97.4°F | Resp 16 | Wt 178.3 lb

## 2022-01-25 DIAGNOSIS — Z8042 Family history of malignant neoplasm of prostate: Secondary | ICD-10-CM | POA: Insufficient documentation

## 2022-01-25 DIAGNOSIS — C20 Malignant neoplasm of rectum: Secondary | ICD-10-CM | POA: Diagnosis present

## 2022-01-25 DIAGNOSIS — Z803 Family history of malignant neoplasm of breast: Secondary | ICD-10-CM | POA: Insufficient documentation

## 2022-01-25 DIAGNOSIS — E7800 Pure hypercholesterolemia, unspecified: Secondary | ICD-10-CM | POA: Diagnosis not present

## 2022-01-25 DIAGNOSIS — Z79899 Other long term (current) drug therapy: Secondary | ICD-10-CM | POA: Insufficient documentation

## 2022-01-25 NOTE — Consult Note (Signed)
NEW PATIENT EVALUATION  Name: Bryan [name]  MRN: [id number]  Date:   01/25/2022     DOB: [birth year]   This 51 y.o. 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  REFERRING PHYSICIAN: Carlena Hurl, PA-C  CHIEF COMPLAINT:  Chief Complaint  Patient presents with   Cancer    Initial consultation    DIAGNOSIS: The encounter diagnosis was Rectal cancer ([place]).   PREVIOUS INVESTIGATIONS:  CT scans MRI scans reviewed Clinical notes reviewed Pathology report reviewed  HPI: Patient is a 51 year old male who presents with anorectal pain and intermittent bright red blood per rectum.  He underwent colonoscopy showing a 4 cm mass in the distal rectum with pathology confirming invasive adenocarcinoma.  His baseline CEA was normal at 3.9.  MRI showed a stage T3BN1 disease with contact of the internal sphincter.  He started neoadjuvant chemotherapy with CAPOX which she is tolerated fairly well although he continues to have some anorectal pain and rectal discomfort.  He also is currently on oral Xeloda.  He was initially evaluated by radiation oncology in [place] although is transferring to [facility] to be closer to home.  He is seen today for evaluation.  His initial rectal exam showed the rectal mass to be 1 to 2 cm from the anal verge noncircumferential measuring up to 3 cm in length.  He did have some 2 small indeterminate lesions in his liver although MRI is in favor of benign disease there.  PLANNED TREATMENT REGIMEN: Whole pelvic radiation with concurrent Xeloda  PAST MEDICAL HISTORY:  has a past medical history of Allergic rhinitis, cause unspecified, Hypercholesteremia, and Rectal cancer ([place]) (09/14/2021).    PAST SURGICAL HISTORY:  Past Surgical History:  Procedure Laterality Date   None      FAMILY HISTORY: family history includes Alcohol abuse  in his father; Breast cancer in his cousin and paternal aunt; Cancer in his paternal aunt and paternal great-grandfather; Cancer (age of onset: 51) in his mother; Diabetes in his paternal aunt; Heart disease in his paternal aunt; Prostate cancer in his paternal great-grandfather.  SOCIAL HISTORY:  reports that he has never smoked. He has never used smokeless tobacco. He reports that he does not drink alcohol and does not use drugs.  ALLERGIES: Patient has no known allergies.  MEDICATIONS:  Current Outpatient Medications  Medication Sig Dispense Refill   capecitabine (XELODA) 500 MG tablet TAKE 3 TABLETS BY MOUTH EVERY MORNING AND 4 TABLETS EVERY EVENING FOR 14 DAYS ON THEN 7 DAYS OFF. 294 tablet 0   ibuprofen (ADVIL) 800 MG tablet Take 1 tablet (800 mg total) by mouth 2 (two) times daily as needed for moderate pain. Take with food. Follow up with PCP for refills 30 tablet 1   hydrocortisone 2.5 % cream Apply topically 2 (two) times daily. (Patient not taking: Reported on 09/14/2021) 30 g 1   ondansetron (ZOFRAN) 8 MG tablet Take 1 tablet (8 mg total) by mouth 2 (two) times daily as needed for refractory nausea / vomiting. Start on day 3 after chemotherapy. (Patient not taking: Reported on 01/25/2022) 30 tablet 1   pantoprazole (PROTONIX) 20 MG tablet Take 1 tablet (20 mg total) by mouth daily. (Patient not taking: Reported on 01/25/2022) 30 tablet 2   prochlorperazine (COMPAZINE) 10 MG tablet Take 1 tablet (10 mg total) by mouth every 6 (six) hours as needed (Nausea or vomiting). (Patient not  taking: Reported on 01/25/2022) 30 tablet 1   No current facility-administered medications for this encounter.    ECOG PERFORMANCE STATUS:  1 - Symptomatic but completely ambulatory  REVIEW OF SYSTEMS: Patient denies any weight loss, fatigue, weakness, fever, chills or night sweats. Patient denies any loss of vision, blurred vision. Patient denies any ringing  of the ears or hearing loss. No irregular  heartbeat. Patient denies heart murmur or history of fainting. Patient denies any chest pain or pain radiating to her upper extremities. Patient denies any shortness of breath, difficulty breathing at night, cough or hemoptysis. Patient denies any swelling in the lower legs. Patient denies any nausea vomiting, vomiting of blood, or coffee ground material in the vomitus. Patient denies any stomach pain. Patient states has had normal bowel movements no significant constipation or diarrhea. Patient denies any dysuria, hematuria or significant nocturia. Patient denies any problems walking, swelling in the joints or loss of balance. Patient denies any skin changes, loss of hair or loss of weight. Patient denies any excessive worrying or anxiety or significant depression. Patient denies any problems with insomnia. Patient denies excessive thirst, polyuria, polydipsia. Patient denies any swollen glands, patient denies easy bruising or easy bleeding. Patient denies any recent infections, allergies or URI. Patient "s visual fields have not changed significantly in recent time.   PHYSICAL EXAM: BP (!) 127/96 (BP Location: Right Arm, Patient Position: Sitting)    Pulse 84    Temp (!) 97.4 F (36.3 C) (Tympanic)    Resp 16    Wt 178 lb 4.8 oz (80.9 kg)    BMI 25.58 kg/m  Well-developed well-nourished patient in NAD. HEENT reveals PERLA, EOMI, discs not visualized.  Oral cavity is clear. No oral mucosal lesions are identified. Neck is clear without evidence of cervical or supraclavicular adenopathy. Lungs are clear to A&P. Cardiac examination is essentially unremarkable with regular rate and rhythm without murmur rub or thrill. Abdomen is benign with no organomegaly or masses noted. Motor sensory and DTR levels are equal and symmetric in the upper and lower extremities. Cranial nerves II through XII are grossly intact. Proprioception is intact. No peripheral adenopathy or edema is identified. No motor or sensory levels are  noted. Crude visual fields are within normal range.  LABORATORY DATA: Pathology report reviewed    RADIOLOGY RESULTS: MRI scans and CT scans reviewed compatible with above-stated findings   IMPRESSION: Stage IIIb adenocarcinoma of the distal rectum undergoing neoadjuvant chemotherapy and 51 year old male  PLAN: At this time after chemotherapy is completed the next several weeks we will go ahead with whole pelvic radiation therapy.  We will plan delivering 45 Gray to his whole pelvis boosting his rectum up to 1000 cGy with 2 successive small field boost's.  Risks and benefits of treatment including skin reaction fatigue exacerbation of diarrhea increased lower urinary tract symptoms alteration of blood count skin reaction all were reviewed with the patient.  He comprehends my treatment plan well.  I have set him up for simulation in about 2 weeks time.  His Xeloda dose will be decreased during concurrent chemoradiation.  Patient comprehends my treatment plan well.  I would like to take this opportunity to thank you for allowing me to participate in the care of your patient.Noreene Filbert, MD

## 2022-01-25 NOTE — Telephone Encounter (Signed)
Under the direction of [employer] PA-C... Spoke w/ patient, verified identity and inquired about his cancer treatment location. Patient confirmed that he will be receiving his treatment at [facility], due to location convince. His next appointment is today 01/25/22.  Shona Simpson PA-C notified of patient decision.

## 2022-01-28 ENCOUNTER — Inpatient Hospital Stay: Payer: BC Managed Care – PPO

## 2022-01-28 ENCOUNTER — Ambulatory Visit: Payer: BC Managed Care – PPO

## 2022-01-28 ENCOUNTER — Ambulatory Visit
Admission: RE | Admit: 2022-01-28 | Discharge: 2022-01-28 | Disposition: A | Discharge status: Home / Self Care | Payer: BC Managed Care – PPO | Source: Ambulatory Visit | Attending: Radiation Oncology | Admitting: Radiation Oncology

## 2022-01-28 ENCOUNTER — Ambulatory Visit: Payer: BC Managed Care – PPO | Admitting: Radiation Oncology

## 2022-01-28 DIAGNOSIS — C20 Malignant neoplasm of rectum: Secondary | ICD-10-CM

## 2022-02-04 MED FILL — Dexamethasone Sodium Phosphate Inj 100 MG/10ML: INTRAMUSCULAR | Qty: 1 | Status: AC

## 2022-02-05 ENCOUNTER — Inpatient Hospital Stay: Payer: BC Managed Care – PPO | Admitting: Hematology

## 2022-02-05 ENCOUNTER — Encounter: Payer: Self-pay | Admitting: Hematology

## 2022-02-05 ENCOUNTER — Inpatient Hospital Stay (HOSPITAL_BASED_OUTPATIENT_CLINIC_OR_DEPARTMENT_OTHER): Payer: BC Managed Care – PPO

## 2022-02-05 ENCOUNTER — Inpatient Hospital Stay: Payer: BC Managed Care – PPO | Attending: Nurse Practitioner

## 2022-02-05 ENCOUNTER — Other Ambulatory Visit: Payer: Self-pay

## 2022-02-05 VITALS — BP 141/85 | HR 81 | Temp 98.9°F | Resp 18 | Wt 180.5 lb

## 2022-02-05 DIAGNOSIS — Z5111 Encounter for antineoplastic chemotherapy: Secondary | ICD-10-CM | POA: Insufficient documentation

## 2022-02-05 DIAGNOSIS — G620 Drug-induced polyneuropathy: Secondary | ICD-10-CM | POA: Insufficient documentation

## 2022-02-05 DIAGNOSIS — C20 Malignant neoplasm of rectum: Secondary | ICD-10-CM | POA: Insufficient documentation

## 2022-02-05 DIAGNOSIS — K6289 Other specified diseases of anus and rectum: Secondary | ICD-10-CM

## 2022-02-05 LAB — CBC WITH DIFFERENTIAL (CANCER CENTER ONLY)
Abs Immature Granulocytes: 0.01 10*3/uL (ref 0.00–0.07)
Basophils Absolute: 0 10*3/uL (ref 0.0–0.1)
Basophils Relative: 1 %
Eosinophils Absolute: 0.1 10*3/uL (ref 0.0–0.5)
Eosinophils Relative: 3 %
HCT: 34.3 % — ABNORMAL LOW (ref 39.0–52.0)
Hemoglobin: 12.1 g/dL — ABNORMAL LOW (ref 13.0–17.0)
Immature Granulocytes: 0 %
Lymphocytes Relative: 33 %
Lymphs Abs: 1.4 10*3/uL (ref 0.7–4.0)
MCH: 29.3 pg (ref 26.0–34.0)
MCHC: 35.3 g/dL (ref 30.0–36.0)
MCV: 83.1 fL (ref 80.0–100.0)
Monocytes Absolute: 0.7 10*3/uL (ref 0.1–1.0)
Monocytes Relative: 16 %
Neutro Abs: 1.9 10*3/uL (ref 1.7–7.7)
Neutrophils Relative %: 47 %
Platelet Count: 131 10*3/uL — ABNORMAL LOW (ref 150–400)
RBC: 4.13 MIL/uL — ABNORMAL LOW (ref 4.22–5.81)
RDW: 19.5 % — ABNORMAL HIGH (ref 11.5–15.5)
WBC Count: 4.2 10*3/uL (ref 4.0–10.5)
nRBC: 0 % (ref 0.0–0.2)

## 2022-02-05 LAB — CMP (CANCER CENTER ONLY)
ALT: 29 U/L (ref 0–44)
AST: 24 U/L (ref 15–41)
Albumin: 4.1 g/dL (ref 3.5–5.0)
Alkaline Phosphatase: 91 U/L (ref 38–126)
Anion gap: 7 (ref 5–15)
BUN: 16 mg/dL (ref 6–20)
CO2: 27 mmol/L (ref 22–32)
Calcium: 9.2 mg/dL (ref 8.9–10.3)
Chloride: 104 mmol/L (ref 98–111)
Creatinine: 1.13 mg/dL (ref 0.61–1.24)
GFR, Estimated: >60 mL/min (ref >60)
Glucose, Bld: 129 mg/dL — ABNORMAL HIGH (ref 70–99)
Potassium: 3.7 mmol/L (ref 3.5–5.1)
Sodium: 138 mmol/L (ref 135–145)
Total Bilirubin: 0.7 mg/dL (ref 0.3–1.2)
Total Protein: 6.8 g/dL (ref 6.5–8.1)

## 2022-02-05 MED ORDER — DEXTROSE 5 % IV SOLN: Freq: Once | INTRAVENOUS | Status: AC

## 2022-02-05 MED ORDER — OXALIPLATIN CHEMO INJECTION 100 MG/20ML
98.0000 mg/m2 | Freq: Once | INTRAVENOUS | Status: AC
  Administered 2022-02-05: 200 mg via INTRAVENOUS
  Filled 2022-02-05: qty 40

## 2022-02-05 MED ORDER — IBUPROFEN 800 MG PO TABS: 800.0000 mg | ORAL_TABLET | Freq: Two times a day (BID) | ORAL | 1 refills | Status: DC | PRN

## 2022-02-05 MED ORDER — PALONOSETRON HCL INJECTION 0.25 MG/5ML
0.2500 mg | Freq: Once | INTRAVENOUS | Status: AC
  Administered 2022-02-05: 0.25 mg via INTRAVENOUS
  Filled 2022-02-05: qty 5

## 2022-02-05 MED ORDER — CAPECITABINE 500 MG PO TABS: ORAL_TABLET | ORAL | 0 refills | Status: DC

## 2022-02-05 MED ORDER — SODIUM CHLORIDE 0.9 % IV SOLN
10.0000 mg | Freq: Once | INTRAVENOUS | Status: AC
  Administered 2022-02-05: 10 mg via INTRAVENOUS
  Filled 2022-02-05: qty 10

## 2022-02-05 NOTE — Progress Notes (Signed)
[place]   Telephone:(336) [phone] Fax:(336) [phone]   Clinic Follow up Note   Patient Care Team: Tysinger, Camelia Eng, PA-C as PCP - General (Family Medicine) Ileana Roup, MD as Consulting Physician (General Surgery) Truitt Merle, MD as Consulting Physician (Hematology) Kyung Rudd, MD as Consulting Physician (Radiation Oncology) Alla Feeling, NP as Nurse Practitioner (Nurse Practitioner) Royston Bake, RN as Registered Nurse Noreene Filbert, MD as Consulting Physician (Radiation Oncology)  Date of Service:  02/05/2022  CHIEF COMPLAINT: f/u of rectal cancer  CURRENT THERAPY:  Capox q3weeks, starting 10/23/21  ASSESSMENT & PLAN:  Bryan [name] is a 51 y.o. male with   1. Adenocarcinoma of the rectum, MMR normal, cT3b N1 M0 -He presented with anorectal pain and intermittent bleeding, work-up showed a 4 cm mass in the distal rectum, path confirmed invasive adenocarcinoma.  Staging work-up is concerning for extension into the mesorectum and possible local lymphadenopathy.   -Baseline CEA normal 3.9 -re-read of staging pelvic MRI on 10/13/21 showed stage T3b, N1 disease, with contact with internal sphincter. -he began neoadjuvant chemo with Capox on 10/23/21. He has tolerated relatively well with some nausea and cold sensitivity. He has some mild neuropathy today, so we will reduce his final oxali dose. -labs reviewed, hgb stable at 12.1, plt 131k. Overall adequate to proceed with his final cycle today. He will start Xeloda tonight (he works night shift) -his CEA was slightly elevated at last visit on 01/15/22. We checked today, result is pending. If this remains elevated, we will order a restaging scan to be done prior to radiation. Otherwise, we will hold off until after treatment. -he is scheduled to undergo CT simulation at [facility] on 02/15/22. He has not yet been scheduled with a start date. -plan to do lab and virtual visit every other week when he is on RT    2.  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 I will reduce his final oxali dose today.   3. Genetics -Patient has strong family history of breast cancer and one relative with prostate cancer. Given this and his personal history of cancer, he qualifies for genetic testing and is interested.   -He has 3 children who are aware of his diagnosis and their potential risks -his rectal tumor is MMR normal, likely not Lynch syndrome -Referral placed at consultation on 10/14/21     PLAN: -proceed with C6 oxaliplatin today with slight dose reduction due to neuropathy -start C6 Xeloda tonight for 14 days  -proceed with CT simulation on 02/15/22 and radiation per [facility] rad onc. -When I know his radiation dose, I will schedule lab and virtual visit every 2 weeks when he is on radiation.   No problem-specific Assessment & Plan notes found for this encounter.   SUMMARY OF ONCOLOGIC HISTORY: Oncology History  Rectal cancer ([place])  08/18/2021 Initial Diagnosis   Rectal cancer ([place])   09/14/2021 Procedure   Colonoscopy by Dr. Dustin Flock -The perianal examination was normal. - The digital rectal exam revealed a firm rectal mass palpated 1-2 cm from the anal verge. The mass was non-circumferential and located predominantly at the left bowel wall. - An ulcerated non-obstructing medium-sized friable mass was found in the distal rectum. The mass was non-circumferential. The mass measured three cm in length. No bleeding was present. Biopsies (5 passes total for 10 total biopsy specimens)were taken with a cold forceps for histology. Estimated blood loss was minimal. -  The exam was otherwise normal throughout the examined colon. - The terminal ileum appeared normal. - No additional abnormalities were found on retroflexion   09/14/2021 Initial Biopsy   Diagnosis Rectum, biopsy, mass - INVASIVE ADENOCARCINOMA.  MMR normal  Baseline CEA  3.9     09/17/2021 Imaging   CT CAP IMPRESSION: 1. Known left rectal mass measures approximately 3.3 x 1.9 x 4.1 cm and is associated with asymmetric haziness in the left perirectal fat and small left perirectal lymph nodes. 2. No findings highly suspicious for distant metastatic disease. There are 2 small indeterminate low-density hepatic lesions. Consider MRI for further characterization. 3. No suspicious findings in the chest. Small perifissural nodules bilaterally, likely benign.   09/25/2021 Imaging      Outside liver and pelvic MRI   10/13/2021 Cancer Staging   Staging form: Colon and Rectum, AJCC 8th Edition - Clinical stage from 10/13/2021: Stage IIIB (cT3, cN1, cM0) - Signed by Truitt Merle, MD on 10/23/2021 Stage prefix: Initial diagnosis    10/23/2021 -  Chemotherapy   Patient is on Treatment Plan : RECTAL Xelox (Capeox) q21d x 6 cycles        INTERVAL HISTORY:  Bryan [name] is here for a follow up of rectal cancer. He was last seen by me on 01/16/22. He presents to the clinic accompanied by his wife. He reports he is doing well overall. He does note some numbness in his hands. He has mildly decreased sensation on tuning fork exam.   All other systems were reviewed with the patient and are negative.  MEDICAL HISTORY:  Past Medical History:  Diagnosis Date   Allergic rhinitis, cause unspecified    spring only   Hypercholesteremia    Rectal cancer ([place]) 09/14/2021    SURGICAL HISTORY: Past Surgical History:  Procedure Laterality Date   None      I have reviewed the social history and family history with the patient and they are unchanged from previous note.  ALLERGIES:  has No Known Allergies.  MEDICATIONS:  Current Outpatient Medications  Medication Sig Dispense Refill   capecitabine (XELODA) 500 MG tablet TAKE 3 TABLETS BY MOUTH EVERY MORNING AND 3 TABLETS EVERY EVENING FOR DAYS OF RADIATION, MONDAY THROUGH FRIDAY. 180 tablet 0   hydrocortisone 2.5 % cream  Apply topically 2 (two) times daily. (Patient not taking: Reported on 09/14/2021) 30 g 1   ibuprofen (ADVIL) 800 MG tablet Take 1 tablet (800 mg total) by mouth 2 (two) times daily as needed for moderate pain. Take with food. Follow up with PCP for refills 30 tablet 1   ondansetron (ZOFRAN) 8 MG tablet Take 1 tablet (8 mg total) by mouth 2 (two) times daily as needed for refractory nausea / vomiting. Start on day 3 after chemotherapy. (Patient not taking: Reported on 01/25/2022) 30 tablet 1   pantoprazole (PROTONIX) 20 MG tablet Take 1 tablet (20 mg total) by mouth daily. (Patient not taking: Reported on 01/25/2022) 30 tablet 2   prochlorperazine (COMPAZINE) 10 MG tablet Take 1 tablet (10 mg total) by mouth every 6 (six) hours as needed (Nausea or vomiting). (Patient not taking: Reported on 01/25/2022) 30 tablet 1   No current facility-administered medications for this visit.    PHYSICAL EXAMINATION: ECOG PERFORMANCE STATUS: 1 - Symptomatic but completely ambulatory  Vitals:   02/05/22 1242  BP: (!) 141/85  Pulse: 81  Resp: 18  Temp: 98.9 F (37.2 C)  SpO2: 96%   Wt Readings from Last 3 Encounters:  02/05/22 180 lb 8 oz (81.9 kg)  01/25/22 178 lb 4.8 oz (80.9 kg)  01/15/22 183 lb (83 kg)     GENERAL:alert, no distress and comfortable SKIN: skin color normal, no rashes or significant lesions EYES: normal, Conjunctiva are pink and non-injected, sclera clear  NEURO: alert & oriented x 3 with fluent speech  LABORATORY DATA:  I have reviewed the data as listed CBC Latest Ref Rng & Units 02/05/2022 01/15/2022 12/25/2021  WBC 4.0 - 10.5 K/uL 4.2 4.4 3.9(L)  Hemoglobin 13.0 - 17.0 g/dL 12.1(L) 13.1 12.5(L)  Hematocrit 39.0 - 52.0 % 34.3(L) 36.5(L) 36.9(L)  Platelets 150 - 400 K/uL 131(L) 144(L) 236     CMP Latest Ref Rng & Units 02/05/2022 01/15/2022 12/25/2021  Glucose 70 - 99 mg/dL 129(H) 160(H) 224(H)  BUN 6 - 20 mg/dL _0 Creatinine 0.61 - 1.24 mg/dL 1.13 1.17 1.14  Sodium 135  - 145 mmol/L 138 137 136  Potassium 3.5 - 5.1 mmol/L 3.7 3.8 3.9  Chloride 98 - 111 mmol/L 104 101 102  CO2 22 - 32 mmol/L _1 Calcium 8.9 - 10.3 mg/dL 9.2 9.5 9.2  Total Protein 6.5 - 8.1 g/dL 6.8 7.0 6.6  Total Bilirubin 0.3 - 1.2 mg/dL 0.7 0.7 0.5  Alkaline Phos 38 - 126 U/L 91 97 86  AST 15 - 41 U/L _2 ALT 0 - 44 U/L _3 RADIOGRAPHIC STUDIES: I have personally reviewed the radiological images as listed and agreed with the findings in the report. No results found.    No orders of the defined types were placed in this encounter.  All questions were answered. The patient knows to call the clinic with any problems, questions or concerns. No barriers to learning was detected. The total time spent in the appointment was 30 minutes.     Truitt Merle, MD 02/05/2022   I, Wilburn Mylar, am acting as scribe for Truitt Merle, MD.   I have reviewed the above documentation for accuracy and completeness, and I agree with the above.

## 2022-02-05 NOTE — Patient Instructions (Signed)
[facility] MEDICAL [facility]COLOGY  Discharge Instructions: Thank you for choosing [facility] to provide your oncology and hematology care.   If you have a lab appointment with the Cancer Center, please go directly to the Cancer Center and check in at the registration area.   Wear comfortable clothing and clothing appropriate for easy access to any Portacath or PICC line.   We strive to give you quality time with your provider. You may need to reschedule your appointment if you arrive late (15 or more minutes).  Arriving late affects you and other patients whose appointments are after yours.  Also, if you miss three or more appointments without notifying the office, you may be dismissed from the clinic at the provider's discretion.      For prescription refill requests, have your pharmacy contact our office and allow 72 hours for refills to be completed.    Today you received the following chemotherapy and/or immunotherapy agents: Oxaliplatin.       To help prevent nausea and vomiting after your treatment, we encourage you to take your nausea medication as directed.  BELOW ARE SYMPTOMS THAT SHOULD BE REPORTED IMMEDIATELY: *FEVER GREATER THAN 100.4 F (38 C) OR HIGHER *CHILLS OR SWEATING *NAUSEA AND VOMITING THAT IS NOT CONTROLLED WITH YOUR NAUSEA MEDICATION *UNUSUAL SHORTNESS OF BREATH *UNUSUAL BRUISING OR BLEEDING *URINARY PROBLEMS (pain or burning when urinating, or frequent urination) *BOWEL PROBLEMS (unusual diarrhea, constipation, pain near the anus) TENDERNESS IN MOUTH AND THROAT WITH OR WITHOUT PRESENCE OF ULCERS (sore throat, sores in mouth, or a toothache) UNUSUAL RASH, SWELLING OR PAIN  UNUSUAL VAGINAL DISCHARGE OR ITCHING   Items with * indicate a potential emergency and should be followed up as soon as possible or go to the Emergency Department if any problems should occur.  Please show the CHEMOTHERAPY ALERT CARD or IMMUNOTHERAPY ALERT CARD at check-in  to the Emergency Department and triage nurse.  Should you have questions after your visit or need to cancel or reschedule your appointment, please contact [facility] MEDICAL [facility]COLOG[facility]  Dept: [phone]  and follow the prompts.  Office hours are 8:00 a.m. to 4:30 p.m. Monday - Friday. Please note that voicemails left after 4:00 p.m. may not be returned until the following business day.  We are closed weekends and major holidays. You have access to a nurse at all times for urgent questions. Please call the main number to the clinic Dept: [phone] and follow the prompts.   For any non-urgent questions, you may also contact your provider using MyChart. We now offer e-Visits for anyone 18 and older to request care online for non-urgent symptoms. For details visit [email or web address].   Also download the MyChart app! Go to the app store, search "MyChart", open the app, select [facility], and log in with your MyChart username and password.  Due to Covid, a mask is required upon entering the hospital/clinic. If you do not have a mask, one will be given to you upon arrival. For doctor visits, patients may have 1 support person aged 18 or older with them. For treatment visits, patients cannot have anyone with them due to current Covid guidelines and our immunocompromised population.   

## 2022-02-06 LAB — CEA: CEA: 8.3 ng/mL — ABNORMAL HIGH (ref 0.0–4.7)

## 2022-02-09 ENCOUNTER — Telehealth: Payer: Self-pay | Admitting: Hematology

## 2022-02-09 NOTE — Telephone Encounter (Signed)
Sch per 2/13 inbasket, pt wife aware

## 2022-02-10 ENCOUNTER — Other Ambulatory Visit: Payer: Self-pay | Admitting: Nurse Practitioner

## 2022-02-10 DIAGNOSIS — C20 Malignant neoplasm of rectum: Secondary | ICD-10-CM

## 2022-02-11 ENCOUNTER — Other Ambulatory Visit: Payer: Self-pay

## 2022-02-11 ENCOUNTER — Other Ambulatory Visit: Payer: Self-pay | Admitting: *Deleted

## 2022-02-11 ENCOUNTER — Telehealth: Payer: Self-pay

## 2022-02-11 NOTE — Telephone Encounter (Signed)
[facility] with [facility] called with pt's spouse on the other line to request fax order for the CT CAP w/contrast Cira Rue, NP ordered on 02/10/2022.  Rep w/[facility] and pt's spouse stated that it would be cheaper for the pt per pt's insurance if [facility] performs the CT.  Informed both parties that this CT is scheduled already at [facility] on 02/18/2022 but Cira Rue, NP wanted the CT done by 02/12/2022.  Also, informed both parties that the pt's insurance has already Authorized this procedure ([id number]).  Explained the importance of having the CT done and as to why the CT is being requested.  Pt is currently taking oral chemotherapy (Capecitabine).  Both parties verbalized understanding of teaching but still requested to have CT done with [facility].  Requested results and images of CT be sent to Dr. Ernestina Penna office to [facility].  Faxed order placed by Cira Rue, NP to [facility] - [facility] at [phone] (T: [phone]).

## 2022-02-15 ENCOUNTER — Ambulatory Visit: Payer: BC Managed Care – PPO

## 2022-02-15 ENCOUNTER — Ambulatory Visit
Admission: RE | Admit: 2022-02-15 | Discharge: 2022-02-15 | Disposition: A | Discharge status: Home / Self Care | Payer: BC Managed Care – PPO | Source: Ambulatory Visit | Attending: Radiation Oncology | Admitting: Radiation Oncology

## 2022-02-15 ENCOUNTER — Telehealth: Payer: Self-pay

## 2022-02-15 ENCOUNTER — Inpatient Hospital Stay: Payer: BC Managed Care – PPO | Admitting: Hematology

## 2022-02-15 ENCOUNTER — Other Ambulatory Visit: Payer: Self-pay | Admitting: *Deleted

## 2022-02-15 DIAGNOSIS — C20 Malignant neoplasm of rectum: Secondary | ICD-10-CM

## 2022-02-15 NOTE — Telephone Encounter (Signed)
Spoke with pt's spouse to obtain copy of radiation schedule at [facility].  Also sent MyChart message stating this much as well.  Pt's spouse stated she will send the schedule to Dr. Burr Medico via [place] or by fax.  Awaiting pt's response.

## 2022-02-18 ENCOUNTER — Ambulatory Visit: Payer: BC Managed Care – PPO

## 2022-02-18 DIAGNOSIS — C20 Malignant neoplasm of rectum: Secondary | ICD-10-CM | POA: Diagnosis not present

## 2022-02-24 ENCOUNTER — Telehealth: Payer: Self-pay

## 2022-02-24 NOTE — Telephone Encounter (Signed)
Spoke with Loma Sousa at [facility] [phone]) regarding obtaining images from pt's CT Scan CAP from 02/18/2022.  Informed Loma Sousa that the report nor images are visible in Care Everywhere in [place].  Loma Sousa stated she will send the images through [facility] so they can be seen in Epic.  The report was faxed to Dr. Ernestina Penna office but the report is also not visible in Care Everywhere which Loma Sousa stated she will send through as well.  Will follow back up on 02/25/2022 if the report & images are still not visible.  Cira Rue, NP made aware of the situation.  Informed Loma Sousa that pt has a follow-up appt with Dr. Burr Medico on 02/26/2022 to go over the CT results. ?

## 2022-02-25 NOTE — Progress Notes (Signed)
Patients wife dropped off the imaging disc for patients recent CT CAP.  This nurse took to imaging to have it loaded to the patients chart.  No further concerns at this time.   ?

## 2022-02-26 ENCOUNTER — Encounter: Payer: Self-pay | Admitting: Hematology

## 2022-02-26 ENCOUNTER — Inpatient Hospital Stay
Admission: RE | Admit: 2022-02-26 | Discharge: 2022-02-26 | Disposition: A | Discharge status: Home / Self Care | Payer: Self-pay | Source: Ambulatory Visit | Attending: Hematology | Admitting: Hematology

## 2022-02-26 ENCOUNTER — Other Ambulatory Visit (HOSPITAL_COMMUNITY): Payer: Self-pay | Admitting: Hematology

## 2022-02-26 ENCOUNTER — Other Ambulatory Visit: Payer: Self-pay | Admitting: *Deleted

## 2022-02-26 ENCOUNTER — Inpatient Hospital Stay: Payer: BC Managed Care – PPO | Admitting: Hematology

## 2022-02-26 DIAGNOSIS — C801 Malignant (primary) neoplasm, unspecified: Secondary | ICD-10-CM

## 2022-02-26 DIAGNOSIS — C20 Malignant neoplasm of rectum: Secondary | ICD-10-CM | POA: Insufficient documentation

## 2022-02-26 NOTE — Progress Notes (Addendum)
[place]   Telephone:(336) [phone] [phone]) [phone]   Clinic Follow up Note   Patient Care Team: Tysinger, Camelia Eng, PA-C as PCP - General (Family Medicine) Ileana Roup, MD as Consulting Physician (General Surgery) Truitt Merle, MD as Consulting Physician (Hematology) Kyung Rudd, MD as Consulting Physician (Radiation Oncology) Alla Feeling, NP as Nurse Practitioner (Nurse Practitioner) Royston Bake, RN as Registered Nurse Noreene Filbert, MD as Consulting Physician (Radiation Oncology)  Date of Service:  02/26/2022  I connected with [name] on 02/26/2022 at 12:20 PM EST by video enabled telemedicine visit and verified that I am speaking with the correct person using two identifiers.  I discussed the limitations, risks, security and privacy concerns of performing an evaluation and management service by telephone and the availability of in person appointments. I also discussed with the patient that there may be a patient responsible charge related to this service. The patient expressed understanding and agreed to proceed.   Other persons participating in the visit and their role in the encounter:  patient's wife  Patient's location:  home Provider's location:  my office  CHIEF COMPLAINT: f/u of rectal cancer  CURRENT THERAPY:  Capox q3weeks, starting 10/23/21  ASSESSMENT & PLAN:  Bryan [name] is a 51 y.o. male with   1. Adenocarcinoma of the rectum, MMR normal, cT3b N1 M0 -He presented with anorectal pain and intermittent bleeding, work-up showed a 4 cm mass in the distal rectum, path confirmed invasive adenocarcinoma.  Staging work-up is concerning for extension into the mesorectum and possible local lymphadenopathy.   -Baseline CEA normal 3.9 -re-read of staging pelvic MRI on 10/13/21 showed stage T3b, N1 disease, with contact with internal sphincter. -he began neoadjuvant chemo with Capox on 10/23/21. He has tolerated relatively well with some nausea  and cold sensitivity. Final oxali dose reduced due to neuropathy. -his CEA has been slowly rising, most recently 8.3 on 02/05/22 -CT CAP on 02/18/22 at [facility] showed the rectal mass measuring 3.9 cm, a little smaller in size, but no  significant shrinkage. No adenopathy or metastatic disease. I reviewed the results and images with them today in detail. -he is scheduled to receive radiation therapy at [facility] 03/01/22 - 04/13/22. We discussed the use of concurrent Xeloda; he will take this M-F, only on days of radiation. -plan to do lab and virtual visit every 2 weeks when he is on chemoRT    2. 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   3. Genetics -Patient has strong family history of breast cancer and one relative with prostate cancer. Given this and his personal history of cancer, he qualifies for genetic testing and is interested.   -He has 3 children who are aware of his diagnosis and their potential risks -his rectal tumor is MMR normal, likely not Lynch syndrome -Referral placed at consultation on 10/14/21     PLAN: -start radiation on Tuesday, 3/7, with [facility] rad onc. -restart Xeloda same day, take only on radiation days -lab and virtual f/u on 3/20   No problem-specific Assessment & Plan notes found for this encounter.   SUMMARY OF ONCOLOGIC HISTORY: Oncology History  Rectal cancer ([place])  08/18/2021 Initial Diagnosis   Rectal cancer ([place])   09/14/2021 Procedure   Colonoscopy by Dr. Dustin Flock -The perianal examination was normal. - The digital rectal exam revealed a firm rectal mass palpated 1-2 cm from the anal verge. The mass was non-circumferential and located predominantly at  the left bowel wall. - An ulcerated non-obstructing medium-sized friable mass was found in the distal rectum. The mass was non-circumferential. The mass measured three cm in length. No bleeding was present. Biopsies (5 passes total for 10 total biopsy  specimens)were taken with a cold forceps for histology. Estimated blood loss was minimal. - The exam was otherwise normal throughout the examined colon. - The terminal ileum appeared normal. - No additional abnormalities were found on retroflexion   09/14/2021 Initial Biopsy   Diagnosis Rectum, biopsy, mass - INVASIVE ADENOCARCINOMA.  MMR normal  Baseline CEA 3.9     09/17/2021 Imaging   CT CAP IMPRESSION: 1. Known left rectal mass measures approximately 3.3 x 1.9 x 4.1 cm and is associated with asymmetric haziness in the left perirectal fat and small left perirectal lymph nodes. 2. No findings highly suspicious for distant metastatic disease. There are 2 small indeterminate low-density hepatic lesions. Consider MRI for further characterization. 3. No suspicious findings in the chest. Small perifissural nodules bilaterally, likely benign.   09/25/2021 Imaging      Outside liver and pelvic MRI   10/13/2021 Cancer Staging   Staging form: Colon and Rectum, AJCC 8th Edition - Clinical stage from 10/13/2021: Stage IIIB (cT3, cN1, cM0) - Signed by Truitt Merle, MD on 10/23/2021 Stage prefix: Initial diagnosis    10/23/2021 -  Chemotherapy   Patient is on Treatment Plan : RECTAL Xelox (Capeox) q21d x 6 cycles     02/18/2022 Imaging   CT CHEST ABDOMEN PELVIS W IV CONTRAST   Impression  IMPRESSION:  1.  Soft tissue mass along the left lateral aspect of the rectum and adjacent infiltration of the perirectal fat on the left, measures approximately 2.5 x 3.9 cm.  2.  There is no adenopathy or evidence of distant metastatic disease.   3.  Hepatic steatosis.       INTERVAL HISTORY:  Sandford [name] was contacted for a follow up of rectal cancer. He was last seen by me on 02/05/22. He reports he is recovering from chemo, and his main concern is continued neuropathy in his toe and fingertips.    All other systems were reviewed with the patient and are negative.  MEDICAL HISTORY:  Past  Medical History:  Diagnosis Date   Allergic rhinitis, cause unspecified    spring only   Hypercholesteremia    Rectal cancer ([place]) 09/14/2021    SURGICAL HISTORY: Past Surgical History:  Procedure Laterality Date   None      I have reviewed the social history and family history with the patient and they are unchanged from previous note.  ALLERGIES:  has No Known Allergies.  MEDICATIONS:  Current Outpatient Medications  Medication Sig Dispense Refill   capecitabine (XELODA) 500 MG tablet TAKE 3 TABLETS BY MOUTH EVERY MORNING AND 3 TABLETS EVERY EVENING FOR DAYS OF RADIATION, MONDAY THROUGH FRIDAY. 180 tablet 0   hydrocortisone 2.5 % cream Apply topically 2 (two) times daily. (Patient not taking: Reported on 09/14/2021) 30 g 1   ibuprofen (ADVIL) 800 MG tablet Take 1 tablet (800 mg total) by mouth 2 (two) times daily as needed for moderate pain. Take with food. Follow up with PCP for refills 30 tablet 1   ondansetron (ZOFRAN) 8 MG tablet Take 1 tablet (8 mg total) by mouth 2 (two) times daily as needed for refractory nausea / vomiting. Start on day 3 after chemotherapy. (Patient not taking: Reported on 01/25/2022) 30 tablet 1   pantoprazole (PROTONIX) 20 MG  tablet Take 1 tablet (20 mg total) by mouth daily. (Patient not taking: Reported on 01/25/2022) 30 tablet 2   prochlorperazine (COMPAZINE) 10 MG tablet Take 1 tablet (10 mg total) by mouth every 6 (six) hours as needed (Nausea or vomiting). (Patient not taking: Reported on 01/25/2022) 30 tablet 1   No current facility-administered medications for this visit.    PHYSICAL EXAMINATION: ECOG PERFORMANCE STATUS: 1 - Symptomatic but completely ambulatory  There were no vitals filed for this visit. Wt Readings from Last 3 Encounters:  02/05/22 180 lb 8 oz (81.9 kg)  01/25/22 178 lb 4.8 oz (80.9 kg)  01/15/22 183 lb (83 kg)     No vitals taken today, Exam not performed today  LABORATORY DATA:  I have reviewed the data as listed CBC  Latest Ref Rng & Units 02/05/2022 01/15/2022 12/25/2021  WBC 4.0 - 10.5 K/uL 4.2 4.4 3.9(L)  Hemoglobin 13.0 - 17.0 g/dL 12.1(L) 13.1 12.5(L)  Hematocrit 39.0 - 52.0 % 34.3(L) 36.5(L) 36.9(L)  Platelets 150 - 400 K/uL 131(L) 144(L) 236     CMP Latest Ref Rng & Units 02/05/2022 01/15/2022 12/25/2021  Glucose 70 - 99 mg/dL 129(H) 160(H) 224(H)  BUN 6 - 20 mg/dL $Remove'16 15 15  'lFFkzFq$ Creatinine 0.61 - 1.24 mg/dL 1.13 1.17 1.14  Sodium 135 - 145 mmol/L 138 137 136  Potassium 3.5 - 5.1 mmol/L 3.7 3.8 3.9  Chloride 98 - 111 mmol/L 104 101 102  CO2 22 - 32 mmol/L $RemoveB'27 27 26  'rBPxfJaL$ Calcium 8.9 - 10.3 mg/dL 9.2 9.5 9.2  Total Protein 6.5 - 8.1 g/dL 6.8 7.0 6.6  Total Bilirubin 0.3 - 1.2 mg/dL 0.7 0.7 0.5  Alkaline Phos 38 - 126 U/L 91 97 86  AST 15 - 41 U/L $Remo'24 21 31  'mjNFu$ ALT 0 - 44 U/L $Remo'29 31 31      'wFYkp$ RADIOGRAPHIC STUDIES: I have personally reviewed the radiological images as listed and agreed with the findings in the report. No results found.    No orders of the defined types were placed in this encounter.  All questions were answered. The patient knows to call the clinic with any problems, questions or concerns. No barriers to learning was detected. The total time spent in the appointment was 30 minutes.     Truitt Merle, MD 02/26/2022   I, Wilburn Mylar, am acting as scribe for Truitt Merle, MD.   I have reviewed the above documentation for accuracy and completeness, and I agree with the above.

## 2022-03-01 ENCOUNTER — Other Ambulatory Visit: Payer: Self-pay | Admitting: *Deleted

## 2022-03-01 ENCOUNTER — Other Ambulatory Visit: Payer: Self-pay

## 2022-03-01 ENCOUNTER — Inpatient Hospital Stay: Payer: BC Managed Care – PPO

## 2022-03-01 ENCOUNTER — Ambulatory Visit: Admission: RE | Admit: 2022-03-01 | Payer: BC Managed Care – PPO | Source: Ambulatory Visit

## 2022-03-01 ENCOUNTER — Telehealth: Payer: Self-pay | Admitting: Hematology

## 2022-03-01 DIAGNOSIS — Z510 Encounter for antineoplastic radiation therapy: Secondary | ICD-10-CM | POA: Diagnosis not present

## 2022-03-01 DIAGNOSIS — C20 Malignant neoplasm of rectum: Secondary | ICD-10-CM

## 2022-03-01 LAB — CBC
HCT: 40.8 % (ref 39.0–52.0)
Hemoglobin: 13.7 g/dL (ref 13.0–17.0)
MCH: 29.8 pg (ref 26.0–34.0)
MCHC: 33.6 g/dL (ref 30.0–36.0)
MCV: 88.7 fL (ref 80.0–100.0)
Platelets: 128 10*3/uL — ABNORMAL LOW (ref 150–400)
RBC: 4.6 MIL/uL (ref 4.22–5.81)
RDW: 17.5 % — ABNORMAL HIGH (ref 11.5–15.5)
WBC: 6 10*3/uL (ref 4.0–10.5)
nRBC: 0 % (ref 0.0–0.2)

## 2022-03-01 LAB — COMPREHENSIVE METABOLIC PANEL
ALT: 38 U/L (ref 0–44)
AST: 29 U/L (ref 15–41)
Albumin: 3.9 g/dL (ref 3.5–5.0)
Alkaline Phosphatase: 86 U/L (ref 38–126)
Anion gap: 9 (ref 5–15)
BUN: 19 mg/dL (ref 6–20)
CO2: 25 mmol/L (ref 22–32)
Calcium: 9.2 mg/dL (ref 8.9–10.3)
Chloride: 102 mmol/L (ref 98–111)
Creatinine, Ser: 1.05 mg/dL (ref 0.61–1.24)
GFR, Estimated: >60 mL/min (ref >60)
Glucose, Bld: 123 mg/dL — ABNORMAL HIGH (ref 70–99)
Potassium: 4.1 mmol/L (ref 3.5–5.1)
Sodium: 136 mmol/L (ref 135–145)
Total Bilirubin: 0.8 mg/dL (ref 0.3–1.2)
Total Protein: 6.9 g/dL (ref 6.5–8.1)

## 2022-03-01 NOTE — Telephone Encounter (Signed)
Scheduled follow-up appointments per 3/3 los. Patient's wife is aware. ?

## 2022-03-02 ENCOUNTER — Ambulatory Visit
Admission: RE | Admit: 2022-03-02 | Discharge: 2022-03-02 | Disposition: A | Discharge status: Home / Self Care | Payer: BC Managed Care – PPO | Source: Ambulatory Visit | Attending: Radiation Oncology | Admitting: Radiation Oncology

## 2022-03-02 ENCOUNTER — Telehealth: Payer: Self-pay | Admitting: Hematology

## 2022-03-02 DIAGNOSIS — Z510 Encounter for antineoplastic radiation therapy: Secondary | ICD-10-CM | POA: Diagnosis not present

## 2022-03-02 NOTE — Telephone Encounter (Signed)
Left message with changed upcoming appointments per patient's request. ?

## 2022-03-03 ENCOUNTER — Ambulatory Visit
Admission: RE | Admit: 2022-03-03 | Discharge: 2022-03-03 | Disposition: A | Discharge status: Home / Self Care | Payer: BC Managed Care – PPO | Source: Ambulatory Visit | Attending: Radiation Oncology | Admitting: Radiation Oncology

## 2022-03-03 DIAGNOSIS — Z510 Encounter for antineoplastic radiation therapy: Secondary | ICD-10-CM | POA: Diagnosis not present

## 2022-03-04 ENCOUNTER — Ambulatory Visit
Admission: RE | Admit: 2022-03-04 | Discharge: 2022-03-04 | Disposition: A | Discharge status: Home / Self Care | Payer: BC Managed Care – PPO | Source: Ambulatory Visit | Attending: Radiation Oncology | Admitting: Radiation Oncology

## 2022-03-04 DIAGNOSIS — Z510 Encounter for antineoplastic radiation therapy: Secondary | ICD-10-CM | POA: Diagnosis not present

## 2022-03-05 ENCOUNTER — Other Ambulatory Visit: Payer: BC Managed Care – PPO

## 2022-03-05 ENCOUNTER — Ambulatory Visit
Admission: RE | Admit: 2022-03-05 | Discharge: 2022-03-05 | Disposition: A | Discharge status: Home / Self Care | Payer: BC Managed Care – PPO | Source: Ambulatory Visit | Attending: Radiation Oncology | Admitting: Radiation Oncology

## 2022-03-05 DIAGNOSIS — Z510 Encounter for antineoplastic radiation therapy: Secondary | ICD-10-CM | POA: Diagnosis not present

## 2022-03-08 ENCOUNTER — Other Ambulatory Visit: Payer: Self-pay | Admitting: Hematology

## 2022-03-08 ENCOUNTER — Inpatient Hospital Stay: Payer: BC Managed Care – PPO | Admitting: Hematology

## 2022-03-08 ENCOUNTER — Ambulatory Visit
Admission: RE | Admit: 2022-03-08 | Discharge: 2022-03-08 | Disposition: A | Discharge status: Home / Self Care | Payer: BC Managed Care – PPO | Source: Ambulatory Visit | Attending: Radiation Oncology | Admitting: Radiation Oncology

## 2022-03-08 DIAGNOSIS — Z510 Encounter for antineoplastic radiation therapy: Secondary | ICD-10-CM | POA: Diagnosis not present

## 2022-03-09 ENCOUNTER — Ambulatory Visit
Admission: RE | Admit: 2022-03-09 | Discharge: 2022-03-09 | Disposition: A | Discharge status: Home / Self Care | Payer: BC Managed Care – PPO | Source: Ambulatory Visit | Attending: Radiation Oncology | Admitting: Radiation Oncology

## 2022-03-09 DIAGNOSIS — Z510 Encounter for antineoplastic radiation therapy: Secondary | ICD-10-CM | POA: Diagnosis not present

## 2022-03-10 ENCOUNTER — Ambulatory Visit
Admission: RE | Admit: 2022-03-10 | Discharge: 2022-03-10 | Disposition: A | Discharge status: Home / Self Care | Payer: BC Managed Care – PPO | Source: Ambulatory Visit | Attending: Radiation Oncology | Admitting: Radiation Oncology

## 2022-03-10 DIAGNOSIS — Z510 Encounter for antineoplastic radiation therapy: Secondary | ICD-10-CM | POA: Diagnosis not present

## 2022-03-11 ENCOUNTER — Ambulatory Visit
Admission: RE | Admit: 2022-03-11 | Discharge: 2022-03-11 | Disposition: A | Discharge status: Home / Self Care | Payer: BC Managed Care – PPO | Source: Ambulatory Visit | Attending: Radiation Oncology | Admitting: Radiation Oncology

## 2022-03-11 DIAGNOSIS — Z510 Encounter for antineoplastic radiation therapy: Secondary | ICD-10-CM | POA: Diagnosis not present

## 2022-03-12 ENCOUNTER — Ambulatory Visit
Admission: RE | Admit: 2022-03-12 | Discharge: 2022-03-12 | Disposition: A | Discharge status: Home / Self Care | Payer: BC Managed Care – PPO | Source: Ambulatory Visit | Attending: Radiation Oncology | Admitting: Radiation Oncology

## 2022-03-12 ENCOUNTER — Other Ambulatory Visit: Payer: BC Managed Care – PPO

## 2022-03-12 DIAGNOSIS — Z510 Encounter for antineoplastic radiation therapy: Secondary | ICD-10-CM | POA: Diagnosis not present

## 2022-03-15 ENCOUNTER — Other Ambulatory Visit: Payer: Self-pay

## 2022-03-15 ENCOUNTER — Ambulatory Visit
Admission: RE | Admit: 2022-03-15 | Discharge: 2022-03-15 | Disposition: A | Discharge status: Home / Self Care | Payer: BC Managed Care – PPO | Source: Ambulatory Visit | Attending: Radiation Oncology | Admitting: Radiation Oncology

## 2022-03-15 ENCOUNTER — Inpatient Hospital Stay (HOSPITAL_BASED_OUTPATIENT_CLINIC_OR_DEPARTMENT_OTHER): Payer: BC Managed Care – PPO | Admitting: Hematology

## 2022-03-15 ENCOUNTER — Inpatient Hospital Stay: Payer: BC Managed Care – PPO

## 2022-03-15 ENCOUNTER — Encounter: Payer: Self-pay | Admitting: Hematology

## 2022-03-15 DIAGNOSIS — C20 Malignant neoplasm of rectum: Secondary | ICD-10-CM

## 2022-03-15 DIAGNOSIS — Z510 Encounter for antineoplastic radiation therapy: Secondary | ICD-10-CM | POA: Diagnosis not present

## 2022-03-15 LAB — COMPREHENSIVE METABOLIC PANEL
ALT: 24 U/L (ref 0–44)
AST: 24 U/L (ref 15–41)
Albumin: 3.9 g/dL (ref 3.5–5.0)
Alkaline Phosphatase: 94 U/L (ref 38–126)
Anion gap: 9 (ref 5–15)
BUN: 14 mg/dL (ref 6–20)
CO2: 27 mmol/L (ref 22–32)
Calcium: 9.3 mg/dL (ref 8.9–10.3)
Chloride: 101 mmol/L (ref 98–111)
Creatinine, Ser: 1.14 mg/dL (ref 0.61–1.24)
GFR, Estimated: >60 mL/min (ref >60)
Glucose, Bld: 133 mg/dL — ABNORMAL HIGH (ref 70–99)
Potassium: 4.3 mmol/L (ref 3.5–5.1)
Sodium: 137 mmol/L (ref 135–145)
Total Bilirubin: 0.5 mg/dL (ref 0.3–1.2)
Total Protein: 7.1 g/dL (ref 6.5–8.1)

## 2022-03-15 LAB — CBC
HCT: 38.6 % — ABNORMAL LOW (ref 39.0–52.0)
Hemoglobin: 12.9 g/dL — ABNORMAL LOW (ref 13.0–17.0)
MCH: 30.1 pg (ref 26.0–34.0)
MCHC: 33.4 g/dL (ref 30.0–36.0)
MCV: 90 fL (ref 80.0–100.0)
Platelets: 178 10*3/uL (ref 150–400)
RBC: 4.29 MIL/uL (ref 4.22–5.81)
RDW: 15.7 % — ABNORMAL HIGH (ref 11.5–15.5)
WBC: 4.3 10*3/uL (ref 4.0–10.5)
nRBC: 0 % (ref 0.0–0.2)

## 2022-03-15 NOTE — Progress Notes (Signed)
?[name]   ?Telephone:(336) [phone] [phone]   ?Clinic Follow up Note  ? ?Patient Care Team: ?Tysinger, Leward Quan as PCP - General (Family Medicine) ?Ileana Roup, MD as Consulting Physician (General Surgery) ?Truitt Merle, MD as Consulting Physician (Hematology) ?Kyung Rudd, MD as Consulting Physician (Radiation Oncology) ?Alla Feeling, NP as Nurse Practitioner (Nurse Practitioner) ?Royston Bake, RN as Equities trader ?Noreene Filbert, MD as Consulting Physician (Radiation Oncology) ? ?Date of Service:  03/15/2022 ? ?I connected with Bryan [name] on 03/15/2022 at  4:00 PM EDT by telephone visit and verified that I am speaking with the correct person using two identifiers.  ?I discussed the limitations, risks, security and privacy concerns of performing an evaluation and management service by telephone and the availability of in person appointments. I also discussed with the patient that there may be a patient responsible charge related to this service. The patient expressed understanding and agreed to proceed.  ? ?Other persons participating in the visit and their role in the encounter:  pt's wife ? ?Patient's location:  home ?Provider's location:  my office ? ?CHIEF COMPLAINT: f/u of rectal cancer ? ?CURRENT THERAPY:  ?ChemoRT with Xeloda, 3/7-4/18/23 ? -Xeloda dose: 1500 mg BID M-F ? ?ASSESSMENT & PLAN:  ?Bryan [name] is a 51 y.o. male with  ? ?1. Adenocarcinoma of the rectum, MMR normal, cT3b N1 M0 ?-He presented with anorectal pain and intermittent bleeding, work-up showed a 4 cm mass in the distal rectum, path confirmed invasive adenocarcinoma.  Staging work-up is concerning for extension into the mesorectum and possible local lymphadenopathy.   ?-Baseline CEA normal 3.9 ?-re-read of staging pelvic MRI on 10/13/21 showed stage T3b, N1 disease, with contact with internal sphincter. ?-he began neoadjuvant chemo with Capox on 10/23/21. He has tolerated relatively well with  some nausea and cold sensitivity. Final oxali dose reduced due to neuropathy. ?-his CEA has been slowly rising, most recently 8.3 on 02/05/22 ?-CT CAP on 02/18/22 at [facility] showed the rectal mass measuring 3.9 cm, a little smaller in size, but no  significant shrinkage. No adenopathy or metastatic disease. I reviewed the results and images with them today in detail. ?-he has started radiation therapy at [facility] on 03/02/22, and plan to complete on 04/13/22.  He is on concurrent Xeloda on days of radiation, tolerating well. ?-Lab reviewed, no concerns, he is clinically doing well, will continue chemo and radiation. ? ?2. Symptom Management: Diarrhea ?-he reports some diarrhea from radiation therapy. His wife expressed concern that he is not taking anything for this. I reassured her this is okay and advised him to just make sure he keeps the area clean. ?  ?3. Neuropathy, G1  ?-secondary to oxaliplatin ?-I recommended he start vit B complex. ?-stable ?  ?4. Genetics ?-Patient has strong family history of breast cancer and one relative with prostate cancer. Given this and his personal history of cancer, he qualifies for genetic testing and is interested.   ?-He has 3 children who are aware of his diagnosis and their potential risks ?-his rectal tumor is MMR normal, likely not Lynch syndrome ?-Referral placed at consultation on 10/14/21 ?  ?  ?PLAN: ?-continue chemoRT ?-will request [facility]AR draw CEA with next lab work ?-lab and virtual f/u on 4/3 ? ? ?No problem-specific Assessment & Plan notes found for this encounter. ? ? ?SUMMARY OF ONCOLOGIC HISTORY: ?Oncology History  ?Rectal cancer ([place])  ?08/18/2021 Initial Diagnosis  ? Rectal cancer ([place]) ?  ?09/14/2021  Procedure  ? Colonoscopy by Dr. Dustin Flock ?-The perianal examination was normal. ?- The digital rectal exam revealed a firm rectal mass palpated 1-2 cm from the anal verge. ?The mass was non-circumferential and located predominantly at the left bowel wall. ?- An  ulcerated non-obstructing medium-sized friable mass was found in the distal rectum. ?The mass was non-circumferential. The mass measured three cm in length. No bleeding was present. Biopsies (5 passes total for 10 total biopsy specimens)were taken with a cold forceps for histology. Estimated blood loss was minimal. ?- The exam was otherwise normal throughout the examined colon. ?- The terminal ileum appeared normal. ?- No additional abnormalities were found on retroflexion ?  ?09/14/2021 Initial Biopsy  ? Diagnosis ?Rectum, biopsy, mass ?- INVASIVE ADENOCARCINOMA. ? ?MMR normal ? ?Baseline CEA 3.9 ? ? ?  ?09/17/2021 Imaging  ? CT CAP IMPRESSION: ?1. Known left rectal mass measures approximately 3.3 x 1.9 x 4.1 cm and is associated with asymmetric haziness in the left perirectal fat and small left perirectal lymph nodes. ?2. No findings highly suspicious for distant metastatic disease. There are 2 small indeterminate low-density hepatic lesions. Consider MRI for further characterization. ?3. No suspicious findings in the chest. Small perifissural nodules bilaterally, likely benign. ?  ?09/25/2021 Imaging  ?  ? ? ?Outside liver and pelvic MRI ?  ?10/13/2021 Cancer Staging  ? Staging form: Colon and Rectum, AJCC 8th Edition ?- Clinical stage from 10/13/2021: Stage IIIB (cT3, cN1, cM0) - Signed by Truitt Merle, MD on 10/23/2021 ?Stage prefix: Initial diagnosis ? ?  ?10/23/2021 -  Chemotherapy  ? Patient is on Treatment Plan : RECTAL Xelox (Capeox) q21d x 6 cycles  ?   ?02/18/2022 Imaging  ? CT CHEST ABDOMEN PELVIS W IV CONTRAST  ? ?Impression ? ?IMPRESSION:  ?1.  Soft tissue mass along the left lateral aspect of the rectum and adjacent infiltration of the perirectal fat on the left, measures approximately 2.5 x 3.9 cm. ? ?2.  There is no adenopathy or evidence of distant metastatic disease.  ? ?3.  Hepatic steatosis.  ?  ? ? ? ?INTERVAL HISTORY:  ?Bryan [name] was contacted for a follow up of rectal cancer. He was last seen by  me on 02/26/22. ?He reports he is tolerating chemoRT well overall. He notes some mild diarrhea from radiation. His wife notes concern that he is not using imodium. He also reports stable neuropathy, does not affect his functionality.  ?  ?All other systems were reviewed with the patient and are negative. ? ?MEDICAL HISTORY:  ?Past Medical History:  ?Diagnosis Date  ? Allergic rhinitis, cause unspecified   ? spring only  ? Hypercholesteremia   ? Rectal cancer ([place]) 09/14/2021  ? ? ?SURGICAL HISTORY: ?Past Surgical History:  ?Procedure Laterality Date  ? None    ? ? ?I have reviewed the social history and family history with the patient and they are unchanged from previous note. ? ?ALLERGIES:  has No Known Allergies. ? ?MEDICATIONS:  ?Current Outpatient Medications  ?Medication Sig Dispense Refill  ? capecitabine (XELODA) 500 MG tablet TAKE 3 TABLETS BY MOUTH EVERY MORNING AND 3 TABLETS EVERY EVENING FOR DAYS OF RADIATION, [place]. 180 tablet 0  ? hydrocortisone 2.5 % cream Apply topically 2 (two) times daily. (Patient not taking: Reported on 09/14/2021) 30 g 1  ? ibuprofen (ADVIL) 800 MG tablet Take 1 tablet (800 mg total) by mouth 2 (two) times daily as needed for moderate pain. Take with food. Follow up  with PCP for refills 30 tablet 1  ? ondansetron (ZOFRAN) 8 MG tablet Take 1 tablet (8 mg total) by mouth 2 (two) times daily as needed for refractory nausea / vomiting. Start on day 3 after chemotherapy. (Patient not taking: Reported on 01/25/2022) 30 tablet 1  ? pantoprazole (PROTONIX) 20 MG tablet Take 1 tablet (20 mg total) by mouth daily. (Patient not taking: Reported on 01/25/2022) 30 tablet 2  ? prochlorperazine (COMPAZINE) 10 MG tablet Take 1 tablet (10 mg total) by mouth every 6 (six) hours as needed (Nausea or vomiting). (Patient not taking: Reported on 01/25/2022) 30 tablet 1  ? ?No current facility-administered medications for this visit.  ? ? ?PHYSICAL EXAMINATION: ?ECOG PERFORMANCE STATUS: 1 -  Symptomatic but completely ambulatory ? ?There were no vitals filed for this visit. ?Wt Readings from Last 3 Encounters:  ?02/05/22 180 lb 8 oz (81.9 kg)  ?01/25/22 178 lb 4.8 oz (80.9 kg)  ?01/15/22 183 lb (

## 2022-03-16 ENCOUNTER — Ambulatory Visit
Admission: RE | Admit: 2022-03-16 | Discharge: 2022-03-16 | Disposition: A | Discharge status: Home / Self Care | Payer: BC Managed Care – PPO | Source: Ambulatory Visit | Attending: Radiation Oncology | Admitting: Radiation Oncology

## 2022-03-16 DIAGNOSIS — Z510 Encounter for antineoplastic radiation therapy: Secondary | ICD-10-CM | POA: Diagnosis not present

## 2022-03-17 ENCOUNTER — Ambulatory Visit
Admission: RE | Admit: 2022-03-17 | Discharge: 2022-03-17 | Disposition: A | Discharge status: Home / Self Care | Payer: BC Managed Care – PPO | Source: Ambulatory Visit | Attending: Radiation Oncology | Admitting: Radiation Oncology

## 2022-03-17 DIAGNOSIS — Z510 Encounter for antineoplastic radiation therapy: Secondary | ICD-10-CM | POA: Diagnosis not present

## 2022-03-18 ENCOUNTER — Telehealth: Payer: Self-pay

## 2022-03-18 ENCOUNTER — Other Ambulatory Visit: Payer: Self-pay

## 2022-03-18 ENCOUNTER — Ambulatory Visit
Admission: RE | Admit: 2022-03-18 | Discharge: 2022-03-18 | Disposition: A | Discharge status: Home / Self Care | Payer: BC Managed Care – PPO | Source: Ambulatory Visit | Attending: Radiation Oncology | Admitting: Radiation Oncology

## 2022-03-18 DIAGNOSIS — Z510 Encounter for antineoplastic radiation therapy: Secondary | ICD-10-CM | POA: Diagnosis not present

## 2022-03-18 NOTE — Telephone Encounter (Signed)
Spoke with [facility] regarding pt's Capecitabine prescription ([phone]).  [facility] on pt's Capec[facility]abine dose for his radiation.  [facility] 6wk supply of Capecitabine and needed clarification as to whether they needed to send the pt additional medication.  Informed [facility] that the pt's last day of radiation is scheduled for 04/13/2022.  [facility] stated they will do a refill for the remaining radiation days needed so the patient will not run out of medication.Marland Kitchen   ?

## 2022-03-19 ENCOUNTER — Other Ambulatory Visit: Payer: BC Managed Care – PPO

## 2022-03-19 ENCOUNTER — Ambulatory Visit
Admission: RE | Admit: 2022-03-19 | Discharge: 2022-03-19 | Disposition: A | Discharge status: Home / Self Care | Payer: BC Managed Care – PPO | Source: Ambulatory Visit | Attending: Radiation Oncology | Admitting: Radiation Oncology

## 2022-03-19 DIAGNOSIS — Z510 Encounter for antineoplastic radiation therapy: Secondary | ICD-10-CM | POA: Diagnosis not present

## 2022-03-22 ENCOUNTER — Telehealth: Payer: BC Managed Care – PPO | Admitting: Hematology

## 2022-03-22 ENCOUNTER — Ambulatory Visit
Admission: RE | Admit: 2022-03-22 | Discharge: 2022-03-22 | Disposition: A | Discharge status: Home / Self Care | Payer: BC Managed Care – PPO | Source: Ambulatory Visit | Attending: Radiation Oncology | Admitting: Radiation Oncology

## 2022-03-22 DIAGNOSIS — Z510 Encounter for antineoplastic radiation therapy: Secondary | ICD-10-CM | POA: Diagnosis not present

## 2022-03-23 ENCOUNTER — Ambulatory Visit
Admission: RE | Admit: 2022-03-23 | Discharge: 2022-03-23 | Disposition: A | Discharge status: Home / Self Care | Payer: BC Managed Care – PPO | Source: Ambulatory Visit | Attending: Radiation Oncology | Admitting: Radiation Oncology

## 2022-03-23 DIAGNOSIS — Z510 Encounter for antineoplastic radiation therapy: Secondary | ICD-10-CM | POA: Diagnosis not present

## 2022-03-24 ENCOUNTER — Ambulatory Visit
Admission: RE | Admit: 2022-03-24 | Discharge: 2022-03-24 | Disposition: A | Discharge status: Home / Self Care | Payer: BC Managed Care – PPO | Source: Ambulatory Visit | Attending: Radiation Oncology | Admitting: Radiation Oncology

## 2022-03-24 DIAGNOSIS — Z510 Encounter for antineoplastic radiation therapy: Secondary | ICD-10-CM | POA: Diagnosis not present

## 2022-03-25 ENCOUNTER — Ambulatory Visit
Admission: RE | Admit: 2022-03-25 | Discharge: 2022-03-25 | Disposition: A | Discharge status: Home / Self Care | Payer: BC Managed Care – PPO | Source: Ambulatory Visit | Attending: Radiation Oncology | Admitting: Radiation Oncology

## 2022-03-25 DIAGNOSIS — Z510 Encounter for antineoplastic radiation therapy: Secondary | ICD-10-CM | POA: Diagnosis not present

## 2022-03-26 ENCOUNTER — Ambulatory Visit
Admission: RE | Admit: 2022-03-26 | Discharge: 2022-03-26 | Disposition: A | Discharge status: Home / Self Care | Payer: BC Managed Care – PPO | Source: Ambulatory Visit | Attending: Radiation Oncology | Admitting: Radiation Oncology

## 2022-03-26 ENCOUNTER — Other Ambulatory Visit: Payer: BC Managed Care – PPO

## 2022-03-26 DIAGNOSIS — Z510 Encounter for antineoplastic radiation therapy: Secondary | ICD-10-CM | POA: Diagnosis not present

## 2022-03-29 ENCOUNTER — Ambulatory Visit
Admission: RE | Admit: 2022-03-29 | Discharge: 2022-03-29 | Disposition: A | Discharge status: Home / Self Care | Payer: BC Managed Care – PPO | Source: Ambulatory Visit | Attending: Radiation Oncology | Admitting: Radiation Oncology

## 2022-03-29 ENCOUNTER — Inpatient Hospital Stay: Payer: BC Managed Care – PPO | Attending: Nurse Practitioner | Admitting: Hematology

## 2022-03-29 ENCOUNTER — Encounter: Payer: Self-pay | Admitting: Hematology

## 2022-03-29 ENCOUNTER — Inpatient Hospital Stay: Payer: BC Managed Care – PPO

## 2022-03-29 DIAGNOSIS — Z510 Encounter for antineoplastic radiation therapy: Secondary | ICD-10-CM | POA: Insufficient documentation

## 2022-03-29 DIAGNOSIS — C20 Malignant neoplasm of rectum: Secondary | ICD-10-CM | POA: Insufficient documentation

## 2022-03-29 LAB — COMPREHENSIVE METABOLIC PANEL
ALT: 24 U/L (ref 0–44)
AST: 21 U/L (ref 15–41)
Albumin: 4.2 g/dL (ref 3.5–5.0)
Alkaline Phosphatase: 85 U/L (ref 38–126)
Anion gap: 7 (ref 5–15)
BUN: 13 mg/dL (ref 6–20)
CO2: 27 mmol/L (ref 22–32)
Calcium: 9 mg/dL (ref 8.9–10.3)
Chloride: 99 mmol/L (ref 98–111)
Creatinine, Ser: 1.18 mg/dL (ref 0.61–1.24)
GFR, Estimated: >60 mL/min (ref >60)
Glucose, Bld: 127 mg/dL — ABNORMAL HIGH (ref 70–99)
Potassium: 4.3 mmol/L (ref 3.5–5.1)
Sodium: 133 mmol/L — ABNORMAL LOW (ref 135–145)
Total Bilirubin: 0.5 mg/dL (ref 0.3–1.2)
Total Protein: 6.5 g/dL (ref 6.5–8.1)

## 2022-03-29 LAB — CBC
HCT: 39.5 % (ref 39.0–52.0)
Hemoglobin: 13.3 g/dL (ref 13.0–17.0)
MCH: 30.1 pg (ref 26.0–34.0)
MCHC: 33.7 g/dL (ref 30.0–36.0)
MCV: 89.4 fL (ref 80.0–100.0)
Platelets: 139 10*3/uL — ABNORMAL LOW (ref 150–400)
RBC: 4.42 MIL/uL (ref 4.22–5.81)
RDW: 15.8 % — ABNORMAL HIGH (ref 11.5–15.5)
WBC: 2.6 10*3/uL — ABNORMAL LOW (ref 4.0–10.5)
nRBC: 0 % (ref 0.0–0.2)

## 2022-03-29 NOTE — Progress Notes (Signed)
?[place]   ?[phone] [phone]   ?Clinic Follow up Note  ? ?Patient Care Team: ?Tysinger, Leward Quan as PCP - General (Family Medicine) ?Ileana Roup, MD as Consulting Physician (General Surgery) ?Truitt Merle, MD as Consulting Physician (Hematology) ?Kyung Rudd, MD as Consulting Physician (Radiation Oncology) ?Alla Feeling, NP as Nurse Practitioner (Nurse Practitioner) ?Royston Bake, RN as Equities trader ?Noreene Filbert, MD as Consulting Physician (Radiation Oncology) ? ?Date of Service:  03/29/2022 ? ?I connected with [name] on 03/29/2022 at  2:00 PM EDT by telephone visit and verified that I am speaking with the correct person using two identifiers.  ?I discussed the limitations, risks, security and privacy concerns of performing an evaluation and management service by telephone and the availability of in person appointments. I also discussed with the patient that there may be a patient responsible charge related to this service. The patient expressed understanding and agreed to proceed.  ? ?Other persons participating in the visit and their role in the encounter:  none ? ?Patient's location:  home ?Provider's location:  my office ? ?CHIEF COMPLAINT: f/u of rectal cancer ? ?CURRENT THERAPY:  ?ChemoRT with Xeloda, 3/7-4/18/23 ?            -Xeloda dose: 1500 mg BID M-F ? ?ASSESSMENT & PLAN:  ?Bryan [name] is a 51 y.o. male with  ? ?1. Adenocarcinoma of the rectum, MMR normal, cT3b N1 M0 ?-He presented with anorectal pain and intermittent bleeding, work-up showed a 4 cm mass in the distal rectum, path confirmed invasive adenocarcinoma.  Staging work-up is concerning for extension into the mesorectum and possible local lymphadenopathy.   ?-Baseline CEA normal 3.9 ?-re-read of staging pelvic MRI on 10/13/21 showed stage T3b, N1 disease, with contact with internal sphincter. ?-he began neoadjuvant chemo with Capox on 10/23/21. He has tolerated relatively well  with some nausea and cold sensitivity. Final oxali dose reduced due to neuropathy. ?-his CEA has been slowly rising, most recently 8.3 on 02/05/22 ?-CT CAP on 02/18/22 at [facility] showed the rectal mass measuring 3.9 cm, a little smaller in size, but no  significant shrinkage. No adenopathy or metastatic disease. I reviewed the results and images with them today in detail. ?-he has started radiation therapy at [facility] on 03/02/22, and plan to complete on 04/13/22.  He is on concurrent Xeloda on days of radiation, tolerating well. ?-Lab reviewed, overall stable. WBC 2.6, I advised he use caution to avoid infections. CEA is pending. He is clinically doing well, will continue chemo and radiation.  ?  ?2. Symptom Management: Diarrhea ?-he reports some diarrhea from radiation therapy. I advised him to use imodium as needed; he has not felt it necessary at this time. ?  ?3. 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 ?-stable, no functional deficits. ?  ?4. Genetics ?-Patient has strong family history of breast cancer and one relative with prostate cancer. Given this and his personal history of cancer, he qualifies for genetic testing and is interested.   ?-He has 3 children who are aware of his diagnosis and their potential risks ?-his rectal tumor is MMR normal, likely not Lynch syndrome ?-Referral placed at consultation on 10/14/21 ?  ?  ?PLAN: ?-continue chemoRT ?-lab and virtual f/u on 4/17 ?-he will call next week and let me know the number of Xeloda pill he needs refill  ? ? ?No problem-specific Assessment & Plan notes found for this  encounter. ? ? ? ?SUMMARY OF ONCOLOGIC HISTORY: ?Oncology History  ?Rectal cancer ([place])  ?08/18/2021 Initial Diagnosis  ? Rectal cancer [facility]) ?  ?09/14/2021 Procedure  ? Colonoscopy by Dr. Dustin Flock ?-The perianal examination was normal. ?- The digital rectal exam revealed a firm rectal mass palpated 1-2 cm from the anal verge. ?The mass was  non-circumferential and located predominantly at the left bowel wall. ?- An ulcerated non-obstructing medium-sized friable mass was found in the distal rectum. ?The mass was non-circumferential. The mass measured three cm in length. No bleeding was present. Biopsies (5 passes total for 10 total biopsy specimens)were taken with a cold forceps for histology. Estimated blood loss was minimal. ?- The exam was otherwise normal throughout the examined colon. ?- The terminal ileum appeared normal. ?- No additional abnormalities were found on retroflexion ?  ?09/14/2021 Initial Biopsy  ? Diagnosis ?Rectum, biopsy, mass ?- INVASIVE ADENOCARCINOMA. ? ?MMR normal ? ?Baseline CEA 3.9 ? ? ?  ?09/17/2021 Imaging  ? CT CAP IMPRESSION: ?1. Known left rectal mass measures approximately 3.3 x 1.9 x 4.1 cm and is associated with asymmetric haziness in the left perirectal fat and small left perirectal lymph nodes. ?2. No findings highly suspicious for distant metastatic disease. There are 2 small indeterminate low-density hepatic lesions. Consider MRI for further characterization. ?3. No suspicious findings in the chest. Small perifissural nodules bilaterally, likely benign. ?  ?09/25/2021 Imaging  ?  ? ? ?Outside liver and pelvic MRI ?  ?10/13/2021 Cancer Staging  ? Staging form: Colon and Rectum, AJCC 8th Edition ?- Clinical stage from 10/13/2021: Stage IIIB (cT3, cN1, cM0) - Signed by Truitt Merle, MD on 10/23/2021 ?Stage prefix: Initial diagnosis ? ?  ?10/23/2021 -  Chemotherapy  ? Patient is on Treatment Plan : RECTAL Xelox (Capeox) q21d x 6 cycles  ?   ?02/18/2022 Imaging  ? CT CHEST ABDOMEN PELVIS W IV CONTRAST  ? ?Impression ? ?IMPRESSION:  ?1.  Soft tissue mass along the left lateral aspect of the rectum and adjacent infiltration of the perirectal fat on the left, measures approximately 2.5 x 3.9 cm. ? ?2.  There is no adenopathy or evidence of distant metastatic disease.  ? ?3.  Hepatic steatosis.  ?  ? ? ? ?INTERVAL HISTORY:  ?Bryan [name] was contacted for a follow up of rectal cancer. He was last seen by me on 02/05/22 with phone visits in the interim. ?He reports he is dealing with diarrhea from radiation and neuropathy from prior chemo. He notes he has not used imodium yet. He reports his neuropathy is stable, no better but no worse. He denies any functional deficits. ?  ?All other systems were reviewed with the patient and are negative. ? ?MEDICAL HISTORY:  ?Past Medical History:  ?Diagnosis Date  ? Allergic rhinitis, cause unspecified   ? spring only  ? Hypercholesteremia   ? Rectal cancer ([place]) 09/14/2021  ? ? ?SURGICAL HISTORY: ?Past Surgical History:  ?Procedure Laterality Date  ? None    ? ? ?I have reviewed the social history and family history with the patient and they are unchanged from previous note. ? ?ALLERGIES:  has No Known Allergies. ? ?MEDICATIONS:  ?Current Outpatient Medications  ?Medication Sig Dispense Refill  ? capecitabine (XELODA) 500 MG tablet TAKE 3 TABLETS BY MOUTH EVERY MORNING AND 3 TABLETS EVERY EVENING FOR DAYS OF RADIATION, [place]. 180 tablet 0  ? hydrocortisone 2.5 % cream Apply topically 2 (two) times daily. (Patient not taking: Reported  on 09/14/2021) 30 g 1  ? ibuprofen (ADVIL) 800 MG tablet Take 1 tablet (800 mg total) by mouth 2 (two) times daily as needed for moderate pain. Take with food. Follow up with PCP for refills 30 tablet 1  ? ondansetron (ZOFRAN) 8 MG tablet Take 1 tablet (8 mg total) by mouth 2 (two) times daily as needed for refractory nausea / vomiting. Start on day 3 after chemotherapy. (Patient not taking: Reported on 01/25/2022) 30 tablet 1  ? pantoprazole (PROTONIX) 20 MG tablet Take 1 tablet (20 mg total) by mouth daily. (Patient not taking: Reported on 01/25/2022) 30 tablet 2  ? prochlorperazine (COMPAZINE) 10 MG tablet Take 1 tablet (10 mg total) by mouth every 6 (six) hours as needed (Nausea or vomiting). (Patient not taking: Reported on 01/25/2022) 30 tablet 1  ? ?No  current facility-administered medications for this visit.  ? ? ?PHYSICAL EXAMINATION: ?ECOG PERFORMANCE STATUS: 1 - Symptomatic but completely ambulatory ? ?There were no vitals filed for this visit. ?Wt R

## 2022-03-30 ENCOUNTER — Ambulatory Visit
Admission: RE | Admit: 2022-03-30 | Discharge: 2022-03-30 | Disposition: A | Discharge status: Home / Self Care | Payer: BC Managed Care – PPO | Source: Ambulatory Visit | Attending: Radiation Oncology | Admitting: Radiation Oncology

## 2022-03-30 DIAGNOSIS — Z510 Encounter for antineoplastic radiation therapy: Secondary | ICD-10-CM | POA: Diagnosis not present

## 2022-03-30 LAB — CEA: CEA: 5.3 ng/mL — ABNORMAL HIGH (ref 0.0–4.7)

## 2022-03-31 ENCOUNTER — Ambulatory Visit
Admission: RE | Admit: 2022-03-31 | Discharge: 2022-03-31 | Disposition: A | Discharge status: Home / Self Care | Payer: BC Managed Care – PPO | Source: Ambulatory Visit | Attending: Radiation Oncology | Admitting: Radiation Oncology

## 2022-03-31 DIAGNOSIS — Z510 Encounter for antineoplastic radiation therapy: Secondary | ICD-10-CM | POA: Diagnosis not present

## 2022-04-01 ENCOUNTER — Ambulatory Visit
Admission: RE | Admit: 2022-04-01 | Discharge: 2022-04-01 | Disposition: A | Discharge status: Home / Self Care | Payer: BC Managed Care – PPO | Source: Ambulatory Visit | Attending: Radiation Oncology | Admitting: Radiation Oncology

## 2022-04-01 ENCOUNTER — Encounter: Payer: Self-pay | Admitting: Hematology

## 2022-04-01 DIAGNOSIS — Z510 Encounter for antineoplastic radiation therapy: Secondary | ICD-10-CM | POA: Diagnosis not present

## 2022-04-02 ENCOUNTER — Other Ambulatory Visit: Payer: BC Managed Care – PPO

## 2022-04-02 ENCOUNTER — Ambulatory Visit
Admission: RE | Admit: 2022-04-02 | Discharge: 2022-04-02 | Disposition: A | Discharge status: Home / Self Care | Payer: BC Managed Care – PPO | Source: Ambulatory Visit | Attending: Radiation Oncology | Admitting: Radiation Oncology

## 2022-04-02 DIAGNOSIS — Z510 Encounter for antineoplastic radiation therapy: Secondary | ICD-10-CM | POA: Diagnosis not present

## 2022-04-05 ENCOUNTER — Ambulatory Visit: Admission: RE | Admit: 2022-04-05 | Payer: BC Managed Care – PPO | Source: Ambulatory Visit

## 2022-04-05 ENCOUNTER — Telehealth: Payer: BC Managed Care – PPO | Admitting: Hematology

## 2022-04-05 ENCOUNTER — Ambulatory Visit
Admission: RE | Admit: 2022-04-05 | Discharge: 2022-04-05 | Disposition: A | Discharge status: Home / Self Care | Payer: BC Managed Care – PPO | Source: Ambulatory Visit | Attending: Radiation Oncology | Admitting: Radiation Oncology

## 2022-04-05 ENCOUNTER — Encounter: Payer: Self-pay | Admitting: Hematology

## 2022-04-05 DIAGNOSIS — Z510 Encounter for antineoplastic radiation therapy: Secondary | ICD-10-CM | POA: Diagnosis not present

## 2022-04-06 ENCOUNTER — Ambulatory Visit
Admission: RE | Admit: 2022-04-06 | Discharge: 2022-04-06 | Disposition: A | Discharge status: Home / Self Care | Payer: BC Managed Care – PPO | Source: Ambulatory Visit | Attending: Radiation Oncology | Admitting: Radiation Oncology

## 2022-04-06 ENCOUNTER — Other Ambulatory Visit: Payer: Self-pay

## 2022-04-06 DIAGNOSIS — Z510 Encounter for antineoplastic radiation therapy: Secondary | ICD-10-CM | POA: Diagnosis not present

## 2022-04-06 MED ORDER — CAPECITABINE 500 MG PO TABS: ORAL_TABLET | ORAL | 0 refills | Status: DC

## 2022-04-07 ENCOUNTER — Ambulatory Visit
Admission: RE | Admit: 2022-04-07 | Discharge: 2022-04-07 | Disposition: A | Discharge status: Home / Self Care | Payer: BC Managed Care – PPO | Source: Ambulatory Visit | Attending: Radiation Oncology | Admitting: Radiation Oncology

## 2022-04-07 ENCOUNTER — Other Ambulatory Visit: Payer: Self-pay | Admitting: Hematology

## 2022-04-07 DIAGNOSIS — Z510 Encounter for antineoplastic radiation therapy: Secondary | ICD-10-CM | POA: Diagnosis not present

## 2022-04-08 ENCOUNTER — Ambulatory Visit: Admission: RE | Admit: 2022-04-08 | Payer: BC Managed Care – PPO | Source: Ambulatory Visit

## 2022-04-08 ENCOUNTER — Ambulatory Visit
Admission: RE | Admit: 2022-04-08 | Discharge: 2022-04-08 | Disposition: A | Discharge status: Home / Self Care | Payer: BC Managed Care – PPO | Source: Ambulatory Visit | Attending: Radiation Oncology | Admitting: Radiation Oncology

## 2022-04-08 ENCOUNTER — Other Ambulatory Visit: Payer: Self-pay | Admitting: Hematology

## 2022-04-08 ENCOUNTER — Telehealth: Payer: Self-pay

## 2022-04-08 DIAGNOSIS — Z510 Encounter for antineoplastic radiation therapy: Secondary | ICD-10-CM | POA: Diagnosis not present

## 2022-04-08 NOTE — Telephone Encounter (Signed)
Spoke with [facility] Pharmacist w/[facility] regarding pt's Capecitabine.  Informed Lanelle Bal that the pt is short 6 pill d/t the original prescription was only sent for 180 pill to be taken with Radiation (6wks).  Informed Lanelle Bal that the correct prescription should have been for 186 pills.  Lanelle Bal stated she will correct the prescription and send the pt the remaining pills.   ?

## 2022-04-09 ENCOUNTER — Ambulatory Visit
Admission: RE | Admit: 2022-04-09 | Discharge: 2022-04-09 | Disposition: A | Discharge status: Home / Self Care | Payer: BC Managed Care – PPO | Source: Ambulatory Visit | Attending: Radiation Oncology | Admitting: Radiation Oncology

## 2022-04-09 ENCOUNTER — Other Ambulatory Visit: Payer: BC Managed Care – PPO

## 2022-04-09 DIAGNOSIS — Z510 Encounter for antineoplastic radiation therapy: Secondary | ICD-10-CM | POA: Diagnosis not present

## 2022-04-12 ENCOUNTER — Inpatient Hospital Stay: Payer: BC Managed Care – PPO

## 2022-04-12 ENCOUNTER — Encounter: Payer: Self-pay | Admitting: Hematology

## 2022-04-12 ENCOUNTER — Ambulatory Visit
Admission: RE | Admit: 2022-04-12 | Discharge: 2022-04-12 | Disposition: A | Discharge status: Home / Self Care | Payer: BC Managed Care – PPO | Source: Ambulatory Visit | Attending: Radiation Oncology | Admitting: Radiation Oncology

## 2022-04-12 ENCOUNTER — Inpatient Hospital Stay (HOSPITAL_BASED_OUTPATIENT_CLINIC_OR_DEPARTMENT_OTHER): Payer: BC Managed Care – PPO | Admitting: Hematology

## 2022-04-12 DIAGNOSIS — C20 Malignant neoplasm of rectum: Secondary | ICD-10-CM | POA: Diagnosis not present

## 2022-04-12 DIAGNOSIS — Z510 Encounter for antineoplastic radiation therapy: Secondary | ICD-10-CM | POA: Diagnosis not present

## 2022-04-12 LAB — COMPREHENSIVE METABOLIC PANEL
ALT: 30 U/L (ref 0–44)
AST: 24 U/L (ref 15–41)
Albumin: 4.2 g/dL (ref 3.5–5.0)
Alkaline Phosphatase: 90 U/L (ref 38–126)
Anion gap: 5 (ref 5–15)
BUN: 14 mg/dL (ref 6–20)
CO2: 29 mmol/L (ref 22–32)
Calcium: 9.2 mg/dL (ref 8.9–10.3)
Chloride: 101 mmol/L (ref 98–111)
Creatinine, Ser: 1.17 mg/dL (ref 0.61–1.24)
GFR, Estimated: >60 mL/min (ref >60)
Glucose, Bld: 145 mg/dL — ABNORMAL HIGH (ref 70–99)
Potassium: 4.5 mmol/L (ref 3.5–5.1)
Sodium: 135 mmol/L (ref 135–145)
Total Bilirubin: 0.4 mg/dL (ref 0.3–1.2)
Total Protein: 7.4 g/dL (ref 6.5–8.1)

## 2022-04-12 LAB — CBC
HCT: 40.1 % (ref 39.0–52.0)
Hemoglobin: 13.4 g/dL (ref 13.0–17.0)
MCH: 30.1 pg (ref 26.0–34.0)
MCHC: 33.4 g/dL (ref 30.0–36.0)
MCV: 90.1 fL (ref 80.0–100.0)
Platelets: 167 10*3/uL (ref 150–400)
RBC: 4.45 MIL/uL (ref 4.22–5.81)
RDW: 15.6 % — ABNORMAL HIGH (ref 11.5–15.5)
WBC: 3.7 10*3/uL — ABNORMAL LOW (ref 4.0–10.5)
nRBC: 0 % (ref 0.0–0.2)

## 2022-04-12 NOTE — Progress Notes (Signed)
?[place]   ?[phone] [phone]   ?Clinic Follow up Note  ? ?Patient Care Team: ?Tysinger, Leward Quan as PCP - General (Family Medicine) ?Ileana Roup, MD as Consulting Physician (General Surgery) ?Truitt Merle, MD as Consulting Physician (Hematology) ?Kyung Rudd, MD as Consulting Physician (Radiation Oncology) ?Alla Feeling, NP as Nurse Practitioner (Nurse Practitioner) ?Royston Bake, RN as Equities trader ?Noreene Filbert, MD as Consulting Physician (Radiation Oncology) ? ?Date of Service:  04/12/2022 ? ?I connected with Bryan [name] on 04/12/2022 at  2:40 PM EDT by telephone visit and verified that I am speaking with the correct person using two identifiers.  ?I discussed the limitations, risks, security and privacy concerns of performing an evaluation and management service by telephone and the availability of in person appointments. I also discussed with the patient that there may be a patient responsible charge related to this service. The patient expressed understanding and agreed to proceed.  ? ?Other persons participating in the visit and their role in the encounter:  pt's wife ? ?Patient's location:  home ?Provider's location:  my office ? ?CHIEF COMPLAINT: f/u of rectal cancer ? ?CURRENT THERAPY:  ?ChemoRT with Xeloda, 3/7-4/18/23 ?            -Xeloda dose: 1500 mg BID M-F ? ?ASSESSMENT & PLAN:  ?Bryan [name] is a 51 y.o. male with  ? ?1. Adenocarcinoma of the rectum, MMR normal, cT3b N1 M0 ?-He presented with anorectal pain and intermittent bleeding, work-up showed a 4 cm mass in the distal rectum, path confirmed invasive adenocarcinoma.  Staging work-up is concerning for extension into the mesorectum and possible local lymphadenopathy.   ?-Baseline CEA normal 3.9 ?-re-read of staging pelvic MRI on 10/13/21 showed stage T3b, N1 disease, with contact with internal sphincter. ?-he began neoadjuvant chemo with Capox on 10/23/21. He has tolerated relatively  well with some nausea and cold sensitivity. Final oxali dose reduced due to neuropathy. ?-his CEA has been slowly rising, most recently 8.3 on 02/05/22 ?-CT CAP on 02/18/22 at [facility] showed the rectal mass measuring 3.9 cm, a little smaller in size, but no significant shrinkage. No adenopathy or metastatic disease.  ?-he has started radiation therapy at [facility] on 03/02/22, and plan to complete on 04/13/22.  He is on concurrent Xeloda on days of radiation, tolerating well. ?-Lab reviewed, overall stable. WBC improved to 3.7. CEA is pending, last on 03/29/22 showed improvement to 5.3. He is clinically doing well, will continue chemo and radiation.  ?-He is scheduled to f/u with Dr. Dema Severin next week.  We discussed that surgical resection is the standard of care, and only curative method if he has residual disease after chemoradiation.  If he achieves complete response after chemoradiation, then watchful wait is an option but requires intensive surveillance.  Due to his young age, I strongly encouraged him to consider surgery. ? ?  ?2. 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 ?-stable, no functional deficits. He declines gabapentin for now. ?  ?3. Genetics ?-Patient has strong family history of breast cancer and one relative with prostate cancer. Given this and his personal history of cancer, he qualifies for genetic testing and is interested.   ?-He has 3 children who are aware of his diagnosis and their potential risks ?-his rectal tumor is MMR normal, likely not Lynch syndrome ?-Referral placed at consultation on 10/14/21 ?  ?  ?PLAN: ?-finish chemoRT tomorrow, 4/18 ?-lab  and f/u in 4 weeks ?-He is scheduled to follow-up with Dr. Dema Severin next week, he may postpone his appointment for 1 to 2 weeks ? ? ?No problem-specific Assessment & Plan notes found for this encounter. ? ? ?SUMMARY OF ONCOLOGIC HISTORY: ?Oncology History  ?Rectal cancer ([place])  ?08/18/2021 Initial  Diagnosis  ? Rectal cancer [facility]) ?  ?09/14/2021 Procedure  ? Colonoscopy by Dr. Dustin Flock ?-The perianal examination was normal. ?- The digital rectal exam revealed a firm rectal mass palpated 1-2 cm from the anal verge. ?The mass was non-circumferential and located predominantly at the left bowel wall. ?- An ulcerated non-obstructing medium-sized friable mass was found in the distal rectum. ?The mass was non-circumferential. The mass measured three cm in length. No bleeding was present. Biopsies (5 passes total for 10 total biopsy specimens)were taken with a cold forceps for histology. Estimated blood loss was minimal. ?- The exam was otherwise normal throughout the examined colon. ?- The terminal ileum appeared normal. ?- No additional abnormalities were found on retroflexion ?  ?09/14/2021 Initial Biopsy  ? Diagnosis ?Rectum, biopsy, mass ?- INVASIVE ADENOCARCINOMA. ? ?MMR normal ? ?Baseline CEA 3.9 ? ? ?  ?09/17/2021 Imaging  ? CT CAP IMPRESSION: ?1. Known left rectal mass measures approximately 3.3 x 1.9 x 4.1 cm and is associated with asymmetric haziness in the left perirectal fat and small left perirectal lymph nodes. ?2. No findings highly suspicious for distant metastatic disease. There are 2 small indeterminate low-density hepatic lesions. Consider MRI for further characterization. ?3. No suspicious findings in the chest. Small perifissural nodules bilaterally, likely benign. ?  ?09/25/2021 Imaging  ?  ? ? ?Outside liver and pelvic MRI ?  ?10/13/2021 Cancer Staging  ? Staging form: Colon and Rectum, AJCC 8th Edition ?- Clinical stage from 10/13/2021: Stage IIIB (cT3, cN1, cM0) - Signed by Truitt Merle, MD on 10/23/2021 ?Stage prefix: Initial diagnosis ? ?  ?10/23/2021 -  Chemotherapy  ? Patient is on Treatment Plan : RECTAL Xelox (Capeox) q21d x 6 cycles  ? ?  ?  ?02/18/2022 Imaging  ? CT CHEST ABDOMEN PELVIS W IV CONTRAST  ? ?Impression ? ?IMPRESSION:  ?1.  Soft tissue mass along the left lateral aspect of  the rectum and adjacent infiltration of the perirectal fat on the left, measures approximately 2.5 x 3.9 cm. ? ?2.  There is no adenopathy or evidence of distant metastatic disease.  ? ?3.  Hepatic steatosis.  ?  ? ? ? ?INTERVAL HISTORY:  ?Bryan [name] was contacted for a follow up of rectal cancer. He was last seen by me on 03/29/22 (phone visit). ?He reports his main concern is neuropathy, which he rates 7/10. He notes it does not interrupt his sleep, mainly because of work (when he is on vacation, it becomes bothersome).  ?  ?All other systems were reviewed with the patient and are negative. ? ?MEDICAL HISTORY:  ?Past Medical History:  ?Diagnosis Date  ? Allergic rhinitis, cause unspecified   ? spring only  ? Hypercholesteremia   ? Rectal cancer ([place]) 09/14/2021  ? ? ?SURGICAL HISTORY: ?Past Surgical History:  ?Procedure Laterality Date  ? None    ? ? ?I have reviewed the social history and family history with the patient and they are unchanged from previous note. ? ?ALLERGIES:  has No Known Allergies. ? ?MEDICATIONS:  ?Current Outpatient Medications  ?Medication Sig Dispense Refill  ? capecitabine (XELODA) 500 MG tablet TAKE 3 TABLETS BY MOUTH EVERY MORNING AND 3 TABLETS EVERY  EVENING FOR DAYS OF RADIATION, [place]. 6 tablet 0  ? hydrocortisone 2.5 % cream Apply topically 2 (two) times daily. (Patient not taking: Reported on 09/14/2021) 30 g 1  ? ibuprofen (ADVIL) 800 MG tablet Take 1 tablet (800 mg total) by mouth 2 (two) times daily as needed for moderate pain. Take with food. Follow up with PCP for refills 30 tablet 1  ? ondansetron (ZOFRAN) 8 MG tablet Take 1 tablet (8 mg total) by mouth 2 (two) times daily as needed for refractory nausea / vomiting. Start on day 3 after chemotherapy. (Patient not taking: Reported on 01/25/2022) 30 tablet 1  ? pantoprazole (PROTONIX) 20 MG tablet Take 1 tablet (20 mg total) by mouth daily. (Patient not taking: Reported on 01/25/2022) 30 tablet 2  ? prochlorperazine  (COMPAZINE) 10 MG tablet Take 1 tablet (10 mg total) by mouth every 6 (six) hours as needed (Nausea or vomiting). (Patient not taking: Reported on 01/25/2022) 30 tablet 1  ? ?No current facility-admini

## 2022-04-13 ENCOUNTER — Telehealth: Payer: Self-pay | Admitting: Hematology

## 2022-04-13 ENCOUNTER — Other Ambulatory Visit: Payer: Self-pay

## 2022-04-13 ENCOUNTER — Ambulatory Visit
Admission: RE | Admit: 2022-04-13 | Discharge: 2022-04-13 | Disposition: A | Discharge status: Home / Self Care | Payer: BC Managed Care – PPO | Source: Ambulatory Visit | Attending: Radiation Oncology | Admitting: Radiation Oncology

## 2022-04-13 DIAGNOSIS — Z510 Encounter for antineoplastic radiation therapy: Secondary | ICD-10-CM | POA: Diagnosis not present

## 2022-04-13 LAB — RAD ONC ARIA SESSION SUMMARY
Course Elapsed Days: 42
Plan Fractions Treated to Date: 3
Plan Prescribed Dose Per Fraction: 1.8 Gy
Plan Total Fractions Prescribed: 3
Plan Total Prescribed Dose: 5.4 Gy
Reference Point Dosage Given to Date: 55.8 Gy
Reference Point Session Dosage Given: 1.8 Gy
Session Number: 31

## 2022-04-13 LAB — CEA: CEA: 4.9 ng/mL — ABNORMAL HIGH (ref 0.0–4.7)

## 2022-04-13 NOTE — Telephone Encounter (Signed)
Left message with follow-up appointment per 4/17 los. ?

## 2022-05-10 ENCOUNTER — Other Ambulatory Visit (HOSPITAL_COMMUNITY): Payer: Self-pay | Admitting: Surgery

## 2022-05-10 ENCOUNTER — Other Ambulatory Visit: Payer: Self-pay | Admitting: Surgery

## 2022-05-10 DIAGNOSIS — C20 Malignant neoplasm of rectum: Secondary | ICD-10-CM

## 2022-05-10 DIAGNOSIS — K769 Liver disease, unspecified: Secondary | ICD-10-CM

## 2022-05-14 ENCOUNTER — Inpatient Hospital Stay: Payer: BC Managed Care – PPO | Attending: Nurse Practitioner | Admitting: Hematology

## 2022-05-14 ENCOUNTER — Other Ambulatory Visit: Payer: Self-pay

## 2022-05-14 ENCOUNTER — Encounter: Payer: Self-pay | Admitting: Hematology

## 2022-05-14 ENCOUNTER — Inpatient Hospital Stay: Payer: BC Managed Care – PPO

## 2022-05-14 VITALS — BP 126/86 | HR 70 | Temp 98.4°F | Wt 176.4 lb

## 2022-05-14 DIAGNOSIS — G620 Drug-induced polyneuropathy: Secondary | ICD-10-CM | POA: Diagnosis not present

## 2022-05-14 DIAGNOSIS — C20 Malignant neoplasm of rectum: Secondary | ICD-10-CM | POA: Insufficient documentation

## 2022-05-14 LAB — FERRITIN: Ferritin: 120 ng/mL (ref 24–336)

## 2022-05-14 LAB — CMP (CANCER CENTER ONLY)
ALT: 49 U/L — ABNORMAL HIGH (ref 0–44)
AST: 32 U/L (ref 15–41)
Albumin: 4.1 g/dL (ref 3.5–5.0)
Alkaline Phosphatase: 100 U/L (ref 38–126)
Anion gap: 5 (ref 5–15)
BUN: 16 mg/dL (ref 6–20)
CO2: 30 mmol/L (ref 22–32)
Calcium: 9 mg/dL (ref 8.9–10.3)
Chloride: 104 mmol/L (ref 98–111)
Creatinine: 1.24 mg/dL (ref 0.61–1.24)
GFR, Estimated: >60 mL/min (ref >60)
Glucose, Bld: 91 mg/dL (ref 70–99)
Potassium: 4.3 mmol/L (ref 3.5–5.1)
Sodium: 139 mmol/L (ref 135–145)
Total Bilirubin: 0.5 mg/dL (ref 0.3–1.2)
Total Protein: 6.9 g/dL (ref 6.5–8.1)

## 2022-05-14 LAB — CBC WITH DIFFERENTIAL (CANCER CENTER ONLY)
Abs Immature Granulocytes: 0.01 10*3/uL (ref 0.00–0.07)
Basophils Absolute: 0 10*3/uL (ref 0.0–0.1)
Basophils Relative: 1 %
Eosinophils Absolute: 0.1 10*3/uL (ref 0.0–0.5)
Eosinophils Relative: 3 %
HCT: 38.2 % — ABNORMAL LOW (ref 39.0–52.0)
Hemoglobin: 13 g/dL (ref 13.0–17.0)
Immature Granulocytes: 0 %
Lymphocytes Relative: 32 %
Lymphs Abs: 0.8 10*3/uL (ref 0.7–4.0)
MCH: 29.5 pg (ref 26.0–34.0)
MCHC: 34 g/dL (ref 30.0–36.0)
MCV: 86.8 fL (ref 80.0–100.0)
Monocytes Absolute: 0.6 10*3/uL (ref 0.1–1.0)
Monocytes Relative: 23 %
Neutro Abs: 1.1 10*3/uL — ABNORMAL LOW (ref 1.7–7.7)
Neutrophils Relative %: 41 %
Platelet Count: 176 10*3/uL (ref 150–400)
RBC: 4.4 MIL/uL (ref 4.22–5.81)
RDW: 13.2 % (ref 11.5–15.5)
WBC Count: 2.6 10*3/uL — ABNORMAL LOW (ref 4.0–10.5)
nRBC: 0 % (ref 0.0–0.2)

## 2022-05-14 LAB — CEA (IN HOUSE-CHCC): CEA (CHCC-In House): 3.22 ng/mL (ref 0.00–5.00)

## 2022-05-14 NOTE — Progress Notes (Signed)
[place]   [phone] [phone]   Clinic Follow up Note   Patient Care Team: Tysinger, Camelia Eng, PA-C as PCP - General (Family Medicine) Ileana Roup, MD as Consulting Physician (General Surgery) Truitt Merle, MD as Consulting Physician (Hematology) Kyung Rudd, MD as Consulting Physician (Radiation Oncology) Alla Feeling, NP as Nurse Practitioner (Nurse Practitioner) Royston Bake, RN as Registered Nurse Noreene Filbert, MD as Consulting Physician (Radiation Oncology)  Date of Service:  05/14/2022  CHIEF COMPLAINT: f/u of rectal cancer  CURRENT THERAPY:  PENDING Surgery 07/03/22  ASSESSMENT & PLAN:  Bryan [name] is a 51 y.o. male with   1. Adenocarcinoma of the rectum, MMR normal, cT3b N1 M0 -He presented with anorectal pain and intermittent bleeding, work-up showed a 4 cm mass in the distal rectum, path confirmed invasive adenocarcinoma.  Staging work-up is concerning for extension into the mesorectum and possible local lymphadenopathy.   -Baseline CEA normal 3.9 -re-read of staging pelvic MRI on 10/13/21 showed stage T3b, N1 disease, with contact with internal sphincter. -he began neoadjuvant chemo with Capox on 10/23/21. He has tolerated relatively well with some nausea and cold sensitivity. Final oxali dose reduced due to neuropathy. -his CEA has been slowly rising, most recently 8.3 on 02/05/22 -CT CAP on 02/18/22 at [facility] showed the rectal mass measuring 3.9 cm, a little smaller in size, but no significant shrinkage. No adenopathy or metastatic disease.  -he completed radiation therapy at [facility] with concurrent Xeloda 03/02/22 - 04/13/22.  -he is scheduled for pelvis MRI on 05/31/22 and for surgical resection with Dr. Dema Severin on 07/01/22. -Lab reviewed, overall stable. WBC back down to 2.6. CEA is pending, last on 04/12/22 showed improvement to 4.9. He is clinically doing well -I briefly reviewed that he would not need adjuvant therapy after  surgery.  We also reviewed the surveillance plan after surgery.   2. 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 -stable, no functional deficits. He declines gabapentin for now.   3. Genetics -Patient has strong family history of breast cancer and one relative with prostate cancer. Given this and his personal history of cancer, he qualifies for genetic testing and is interested.   -He has 3 children who are aware of his diagnosis and their potential risks -his rectal tumor is MMR normal, likely not Lynch syndrome -Referral placed at consultation on 10/14/21     PLAN: -pelvis MRI on 05/31/22 -surgical resection with Dr. Dema Severin on 07/03/22 -lab at [facility] in the last week of July, with virtual visit several days later   No problem-specific Assessment & Plan notes found for this encounter.   SUMMARY OF ONCOLOGIC HISTORY: Oncology History  Rectal cancer ([place])  08/18/2021 Initial Diagnosis   Rectal cancer ([place])   09/14/2021 Procedure   Colonoscopy by Dr. Dustin Flock -The perianal examination was normal. - The digital rectal exam revealed a firm rectal mass palpated 1-2 cm from the anal verge. The mass was non-circumferential and located predominantly at the left bowel wall. - An ulcerated non-obstructing medium-sized friable mass was found in the distal rectum. The mass was non-circumferential. The mass measured three cm in length. No bleeding was present. Biopsies (5 passes total for 10 total biopsy specimens)were taken with a cold forceps for histology. Estimated blood loss was minimal. - The exam was otherwise normal throughout the examined colon. - The terminal ileum appeared normal. - No additional abnormalities were found on retroflexion   09/14/2021 Initial  Biopsy   Diagnosis Rectum, biopsy, mass - INVASIVE ADENOCARCINOMA.  MMR normal  Baseline CEA 3.9     09/17/2021 Imaging   CT CAP IMPRESSION: 1. Known left rectal mass measures  approximately 3.3 x 1.9 x 4.1 cm and is associated with asymmetric haziness in the left perirectal fat and small left perirectal lymph nodes. 2. No findings highly suspicious for distant metastatic disease. There are 2 small indeterminate low-density hepatic lesions. Consider MRI for further characterization. 3. No suspicious findings in the chest. Small perifissural nodules bilaterally, likely benign.   09/25/2021 Imaging      Outside liver and pelvic MRI   10/13/2021 Cancer Staging   Staging form: Colon and Rectum, AJCC 8th Edition - Clinical stage from 10/13/2021: Stage IIIB (cT3, cN1, cM0) - Signed by Feng, Yan, MD on 10/23/2021 Stage prefix: Initial diagnosis    10/23/2021 -  Chemotherapy   Patient is on Treatment Plan : RECTAL Xelox (Capeox) q21d x 6 cycles      02/18/2022 Imaging   CT CHEST ABDOMEN PELVIS W IV CONTRAST   Impression  IMPRESSION:  1.  Soft tissue mass along the left lateral aspect of the rectum and adjacent infiltration of the perirectal fat on the left, measures approximately 2.5 x 3.9 cm.  2.  There is no adenopathy or evidence of distant metastatic disease.   3.  Hepatic steatosis.       INTERVAL HISTORY:  Bryan [name] is here for a follow up of rectal cancer. He was last seen by me on 04/12/22. He presents to the clinic accompanied by his wife. He reports he is doing well overall, no residual side effects from radiation.   All other systems were reviewed with the patient and are negative.  MEDICAL HISTORY:  Past Medical History:  Diagnosis Date   Allergic rhinitis, cause unspecified    spring only   Hypercholesteremia    Rectal cancer ([facility]) 09/14/2021    SURGICAL HISTORY: Past Surgical History:  Procedure Laterality Date   None      I have reviewed the social history and family history with the patient and they are unchanged from previous note.  ALLERGIES:  has No Known Allergies.  MEDICATIONS:  Current Outpatient Medications   Medication Sig Dispense Refill   capecitabine (XELODA) 500 MG tablet TAKE 3 TABLETS BY MOUTH EVERY MORNING AND 3 TABLETS EVERY EVENING FOR DAYS OF RADIATION, MONDAY THROUGH FRIDAY. 6 tablet 0   hydrocortisone 2.5 % cream Apply topically 2 (two) times daily. (Patient not taking: Reported on 09/14/2021) 30 g 1   ibuprofen (ADVIL) 800 MG tablet Take 1 tablet (800 mg total) by mouth 2 (two) times daily as needed for moderate pain. Take with food. Follow up with PCP for refills 30 tablet 1   ondansetron (ZOFRAN) 8 MG tablet Take 1 tablet (8 mg total) by mouth 2 (two) times daily as needed for refractory nausea / vomiting. Start on day 3 after chemotherapy. (Patient not taking: Reported on 01/25/2022) 30 tablet 1   pantoprazole (PROTONIX) 20 MG tablet Take 1 tablet (20 mg total) by mouth daily. (Patient not taking: Reported on 01/25/2022) 30 tablet 2   prochlorperazine (COMPAZINE) 10 MG tablet Take 1 tablet (10 mg total) by mouth every 6 (six) hours as needed (Nausea or vomiting). (Patient not taking: Reported on 01/25/2022) 30 tablet 1   No current facility-administered medications for this visit.    PHYSICAL EXAMINATION: ECOG PERFORMANCE STATUS: 1 - Symptomatic but completely ambulatory  Vitals:     05/14/22 1423  BP: 126/86  Pulse: 70  Temp: 98.4 F (36.9 C)  SpO2: 100%   Wt Readings from Last 3 Encounters:  05/14/22 176 lb 6.4 oz (80 kg)  02/05/22 180 lb 8 oz (81.9 kg)  01/25/22 178 lb 4.8 oz (80.9 kg)     GENERAL:alert, no distress and comfortable SKIN: skin color normal, no rashes or significant lesions EYES: normal, Conjunctiva are pink and non-injected, sclera clear  NEURO: alert & oriented x 3 with fluent speech  LABORATORY DATA:  I have reviewed the data as listed    Latest Ref Rng & Units 05/14/2022    2:06 PM 04/12/2022   12:50 PM 03/29/2022   12:48 PM  CBC  WBC 4.0 - 10.5 K/uL 2.6   3.7   2.6    Hemoglobin 13.0 - 17.0 g/dL 13.0   13.4   13.3    Hematocrit 39.0 - 52.0 % 38.2    40.1   39.5    Platelets 150 - 400 K/uL 176   167   139          Latest Ref Rng & Units 05/14/2022    2:06 PM 04/12/2022   12:50 PM 03/29/2022   12:48 PM  CMP  Glucose 70 - 99 mg/dL 91   145   127    BUN 6 - 20 mg/dL _0 Creatinine 0.61 - 1.24 mg/dL 1.24   1.17   1.18    Sodium 135 - 145 mmol/L 139   135   133    Potassium 3.5 - 5.1 mmol/L 4.3   4.5   4.3    Chloride 98 - 111 mmol/L 104   101   99    CO2 22 - 32 mmol/L _1 Calcium 8.9 - 10.3 mg/dL 9.0   9.2   9.0    Total Protein 6.5 - 8.1 g/dL 6.9   7.4   6.5    Total Bilirubin 0.3 - 1.2 mg/dL 0.5   0.4   0.5    Alkaline Phos 38 - 126 U/L 100   90   85    AST 15 - 41 U/L 32   24   21    ALT 0 - 44 U/L 49   30   24        RADIOGRAPHIC STUDIES: I have personally reviewed the radiological images as listed and agreed with the findings in the report. No results found.    No orders of the defined types were placed in this encounter.  All questions were answered. The patient knows to call the clinic with any problems, questions or concerns. No barriers to learning was detected. The total time spent in the appointment was 20 minutes.     Truitt Merle, MD 05/14/2022   I, Wilburn Mylar, am acting as scribe for Truitt Merle, MD.   I have reviewed the above documentation for accuracy and completeness, and I agree with the above.

## 2022-05-17 ENCOUNTER — Telehealth: Payer: Self-pay | Admitting: Hematology

## 2022-05-17 ENCOUNTER — Ambulatory Visit
Admission: RE | Admit: 2022-05-17 | Discharge: 2022-05-17 | Disposition: A | Discharge status: Home / Self Care | Payer: BC Managed Care – PPO | Source: Ambulatory Visit | Attending: Radiation Oncology | Admitting: Radiation Oncology

## 2022-05-17 VITALS — BP 125/84 | HR 76 | Ht 70.0 in | Wt 176.8 lb

## 2022-05-17 DIAGNOSIS — R197 Diarrhea, unspecified: Secondary | ICD-10-CM | POA: Insufficient documentation

## 2022-05-17 DIAGNOSIS — Z9221 Personal history of antineoplastic chemotherapy: Secondary | ICD-10-CM | POA: Insufficient documentation

## 2022-05-17 DIAGNOSIS — G629 Polyneuropathy, unspecified: Secondary | ICD-10-CM | POA: Insufficient documentation

## 2022-05-17 DIAGNOSIS — Z923 Personal history of irradiation: Secondary | ICD-10-CM | POA: Diagnosis not present

## 2022-05-17 DIAGNOSIS — C20 Malignant neoplasm of rectum: Secondary | ICD-10-CM | POA: Diagnosis present

## 2022-05-17 NOTE — Progress Notes (Signed)
Radiation Oncology Follow up Note  Name: Bryan [name]   Date:   05/17/2022 MRN:  [id number] DOB: 1971-11-08    This 51 y.o. male presents to the clinic today for 1 month follow-up status post neoadjuvant chemoradiation therapy for stage IIIc (cT3b N1 M0) adenocarcinoma the distal rectum.  REFERRING PROVIDER: Carlena Hurl, PA-C  HPI: Patient is a 51 year old male now at 1 month having completed concurrent neoadjuvant chemoradiation for stage IIIc adenocarcinoma the distal rectum.  Seen today in follow-up he is doing well.  He has some brief intermittent diarrhea on occasion.No increased lower urinary tract symptoms.  He is not having significant fatigue..  Patient does have some peripheral neuropathy.  He is scheduled for surgical resection in July.  COMPLICATIONS OF TREATMENT: none  FOLLOW UP COMPLIANCE: keeps appointments   PHYSICAL EXAM:  BP 125/84   Pulse 76   Ht '5\' 10"'$  (1.778 m)   Wt 176 lb 12.8 oz (80.2 kg)   BMI 25.37 kg/m  Well-developed well-nourished patient in NAD. HEENT reveals PERLA, EOMI, discs not visualized.  Oral cavity is clear. No oral mucosal lesions are identified. Neck is clear without evidence of cervical or supraclavicular adenopathy. Lungs are clear to A&P. Cardiac examination is essentially unremarkable with regular rate and rhythm without murmur rub or thrill. Abdomen is benign with no organomegaly or masses noted. Motor sensory and DTR levels are equal and symmetric in the upper and lower extremities. Cranial nerves II through XII are grossly intact. Proprioception is intact. No peripheral adenopathy or edema is identified. No motor or sensory levels are noted. Crude visual fields are within normal range.  RADIOLOGY RESULTS: No current films to review  PLAN: Present time patient is now 1 month out from concurrent neoadjuvant chemoradiation.  He is doing well very low side effect profile.  He is already scheduled for surgery in July.  I will see him back  sometime in August September.  Patient knows to call with any concerns.  I would like to take this opportunity to thank you for allowing me to participate in the care of your patient.Noreene Filbert, MD

## 2022-05-17 NOTE — Telephone Encounter (Signed)
Left message with follow-up appointment per 5/19 los.

## 2022-05-31 ENCOUNTER — Encounter (HOSPITAL_COMMUNITY): Payer: Self-pay

## 2022-05-31 ENCOUNTER — Ambulatory Visit (HOSPITAL_COMMUNITY)
Admission: RE | Admit: 2022-05-31 | Discharge: 2022-05-31 | Disposition: A | Discharge status: Home / Self Care | Payer: BC Managed Care – PPO | Source: Ambulatory Visit | Attending: Surgery | Admitting: Surgery

## 2022-05-31 DIAGNOSIS — C20 Malignant neoplasm of rectum: Secondary | ICD-10-CM

## 2022-05-31 DIAGNOSIS — K769 Liver disease, unspecified: Secondary | ICD-10-CM

## 2022-06-04 ENCOUNTER — Ambulatory Visit (HOSPITAL_COMMUNITY)
Admission: RE | Admit: 2022-06-04 | Discharge: 2022-06-04 | Disposition: A | Discharge status: Home / Self Care | Payer: BC Managed Care – PPO | Source: Ambulatory Visit | Attending: Surgery | Admitting: Surgery

## 2022-06-04 DIAGNOSIS — C20 Malignant neoplasm of rectum: Secondary | ICD-10-CM | POA: Diagnosis present

## 2022-06-04 DIAGNOSIS — K769 Liver disease, unspecified: Secondary | ICD-10-CM | POA: Diagnosis present

## 2022-06-04 IMAGING — MR MR PELVIS W/O CM
6 of 7 series · 41 of 48 positions shown · non-contrast
Comparison: None Available.

Chest abdomen pelvis CT [DATE]. Also outside imaging
performed in [DATE].

CLINICAL DATA: History of adenocarcinoma of the rectum post
radiotherapy.

EXAM:
MRI PELVIS WITHOUT CONTRAST
TECHNIQUE: Multiplanar multisequence MR imaging of the pelvis was performed. No
intravenous contrast was administered. Ultrasound gel was
administered per rectum to optimize tumor evaluation.

[Series 2: T2 · sagittal · 3.0mm · 0.74mm/px · 6 of 40 slices shown (1 of 4)]
[im 1/40]
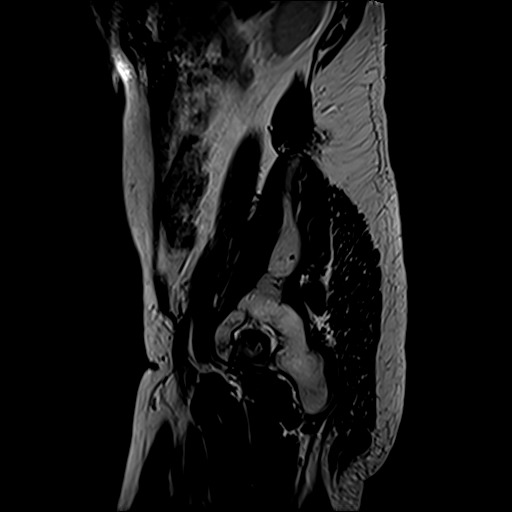
[im 8/40]
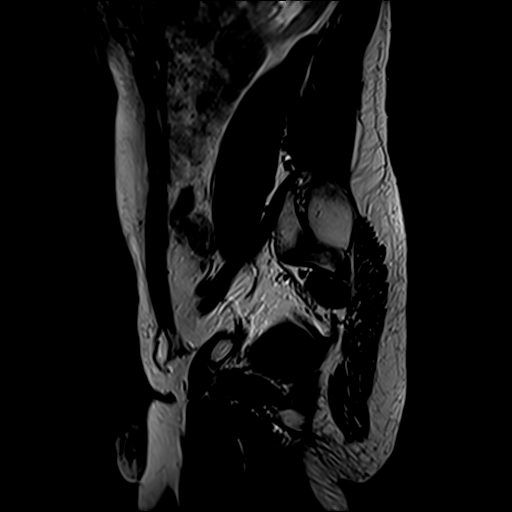
[im 16/40]
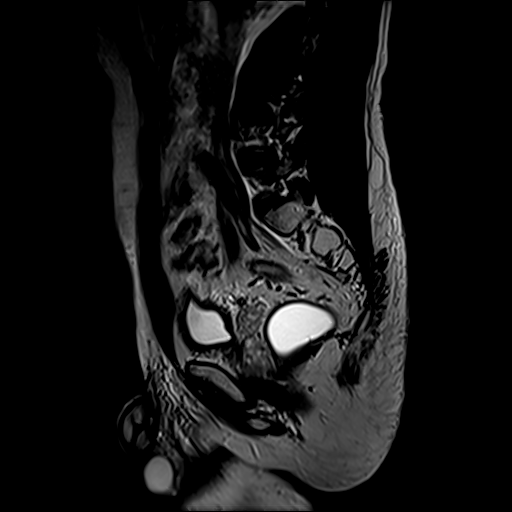
[im 24/40]
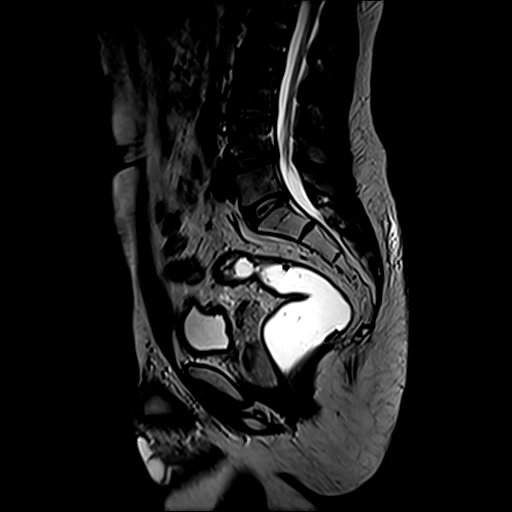
[im 32/40]
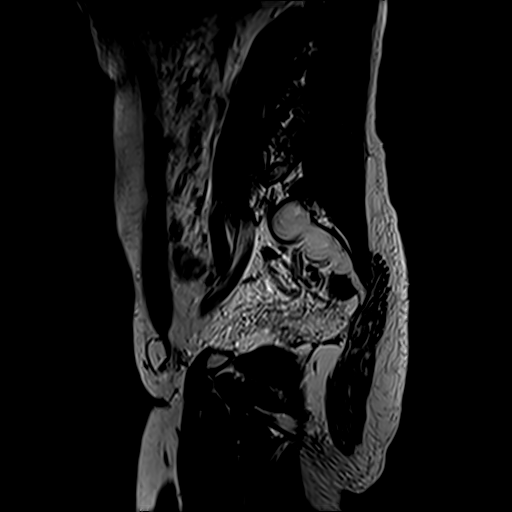
[im 40/40]
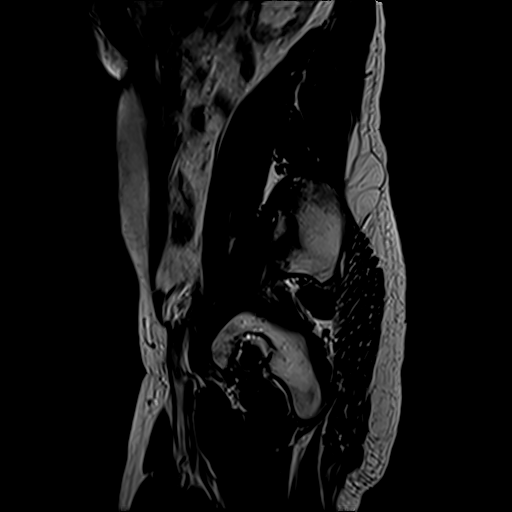

[Series 3: T2 · axial · 5.0mm · 0.99mm/px · z∈[-139,+77]mm · 5 of 37 slices shown (2 of 4)]
[im 1/37]
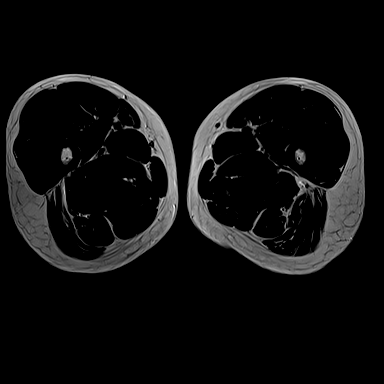
[im 10/37]
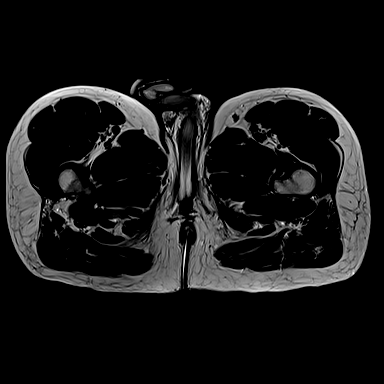
[im 19/37]
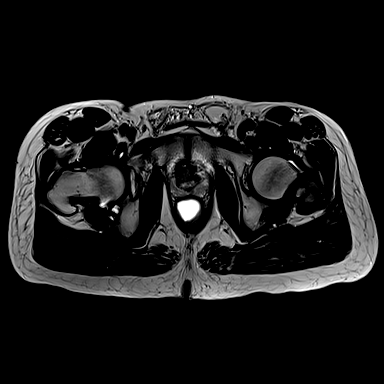
[im 28/37]
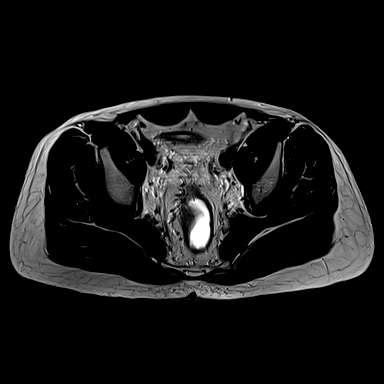
[im 37/37]
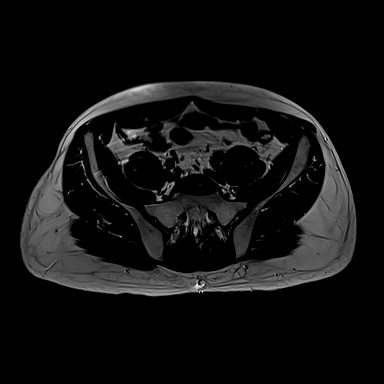

[Series 4: T2 · coronal · 3.0mm · 0.72mm/px · 8 of 54 slices shown (3 of 4)]
[im 1/54]
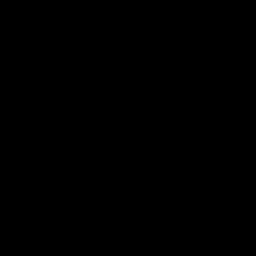
[im 8/54]
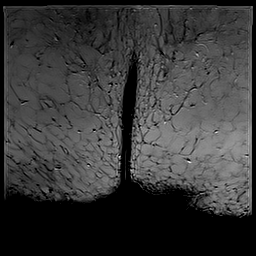
[im 16/54]
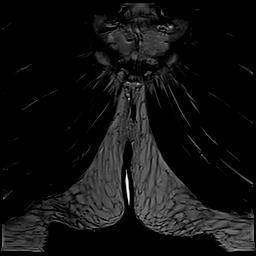
[im 23/54]
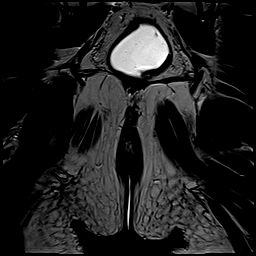
[im 31/54]
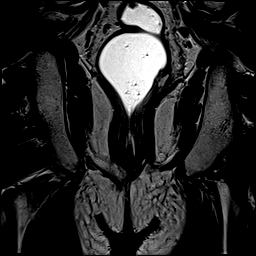
[im 38/54]
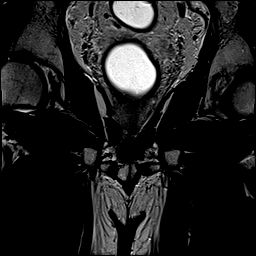
[im 46/54]
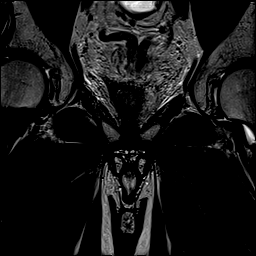
[im 54/54]
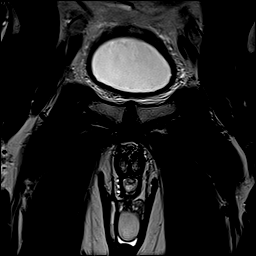

[Series 5: DWI · axial · 5.0mm · 1.48mm/px · z∈[-137,+85]mm · 11 of 76 slices shown (1 of 2)]
[im 1/76]
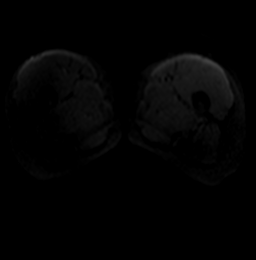
[im 8/76]
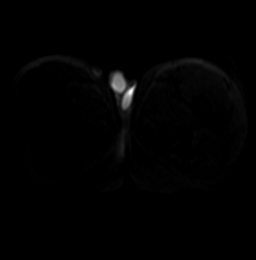
[im 16/76]
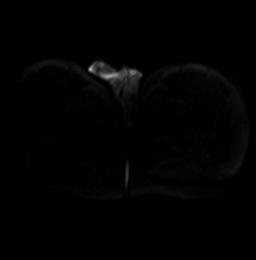
[im 23/76]
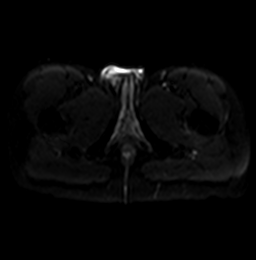
[im 31/76]
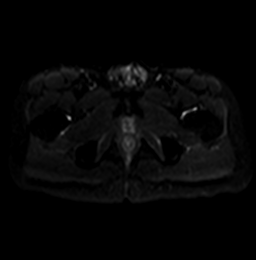
[im 38/76]
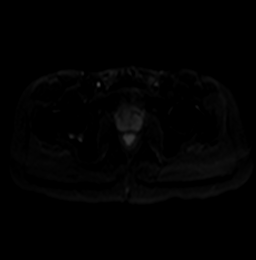
[im 46/76]
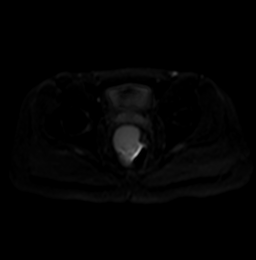
[im 53/76]
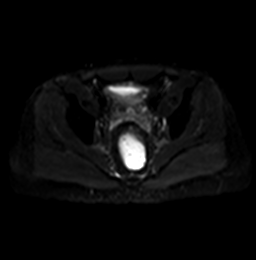
[im 61/76]
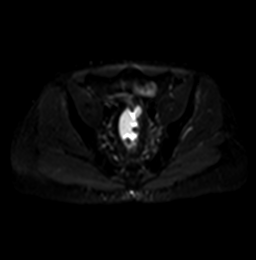
[im 68/76]
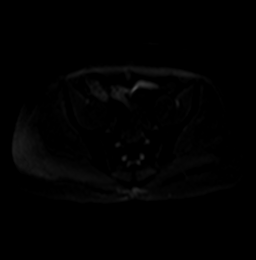
[im 76/76]
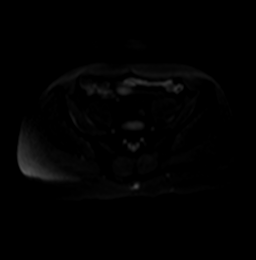

[Series 6: DWI · axial · 5.0mm · 1.48mm/px · z∈[-137,+85]mm · 5 of 38 slices shown (2 of 2)]
[im 1/38]
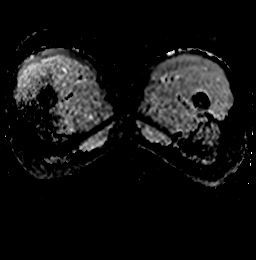
[im 10/38]
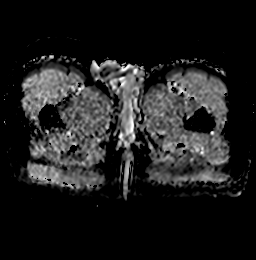
[im 19/38]
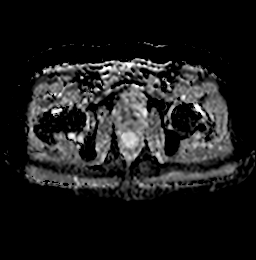
[im 28/38]
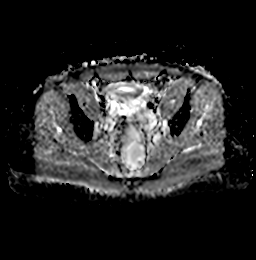
[im 38/38]
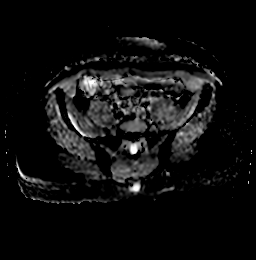

[Series 7: T2 · axial · 3.0mm · 0.70mm/px · z∈[-122,-7]mm · 6 of 57 slices shown (4 of 4)]
[im 1/57]
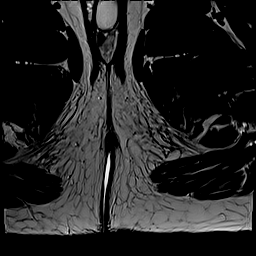
[im 9/57]
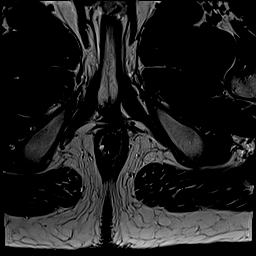
[im 17/57]
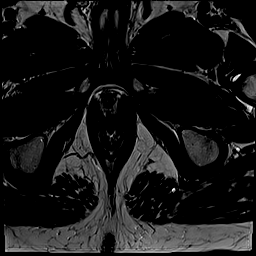
[im 25/57]
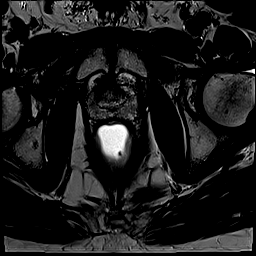
[im 33/57]
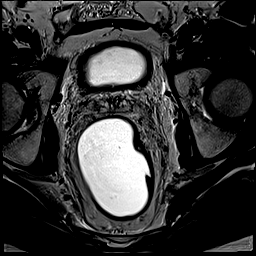
[im 41/57]
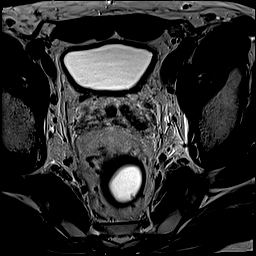

[41 of 48 positions shown; findings below may reference images not displayed]

FINDINGS: TUMOR LOCATION

Tumor distance from Anal Verge/Skin Surface: No definitive evidence
of residual tumor remains following therapy.

Single area of of T2 hyperintensity surrounded by hypointense T2
signal is approximally 6 cm above the anal verge, between 6 and 7 cm
and is along the LEFT lateral rectum. No visible tumor elsewhere.

Tumor distance to Internal Anal Sphincter: The area above
approximately 2.3 cm from the upper margin of the sphincter complex.
Cm

TUMOR DESCRIPTION

Circumferential Extent: Eccentric area along the LEFT lateral rectum
where there was frank soft tissue extending along the LEFT lateral
rectum on the prior study. This shows no sign of restricted
diffusion and mild adjacent stranding. Area measuring 1.8 x 1.0 cm.

Tumor Length: Area referenced above measuring approximately 2.1 cm
greatest craniocaudal dimension.

T - CATEGORY

Extension through Muscularis Propria: Area along the LEFT lateral
rectum likely extends beyond the muscularis at the site of previous
tumor.

Shortest Distance of any tumor/node from Mesorectal Fascia: Not
applicable

Extramural Vascular Invasion/Tumor Thrombus: No

Invasion of Anterior Peritoneal Reflection: No

Involvement of Adjacent Organs or Pelvic Sidewall: No

Levator Ani Involvement: No

N - CATEGORY

Mesorectal Lymph Nodes >=5mm: No

Extra-mesorectal Lymphadenopathy: No signs of adenopathy in the
pelvis

Other:  None.
IMPRESSION: 1. Marked interval response to therapy with only a small irregular
area along the LEFT lateral rectum remaining. This area shows no
signs of restricted diffusion currently. The appearance of the
previous tumor with high T2 signal suggests mucinous component. This
could reflect residual mucinous component though is favored to
represent undermining of the rectal wall at the site of previous
tumor following response to therapy. This is contained to the area
just above the pelvic floor and shows no significant surrounding
stranding or signs of inflammation. Could consider direct
visualization or even additional imaging following contrast to
assess for any enhancing components.
2. Given potential undermining of the LEFT lateral rectal wall
following response to therapy would suggest correlation with any new
pelvic symptoms though lack of inflammatory changes is encouraging.
3. No visible further involvement of the sphincter complex.
4. No pelvic adenopathy.

## 2022-06-14 ENCOUNTER — Encounter (HOSPITAL_COMMUNITY): Payer: BC Managed Care – PPO

## 2022-06-21 ENCOUNTER — Ambulatory Visit: Payer: Self-pay | Admitting: Surgery

## 2022-06-21 ENCOUNTER — Other Ambulatory Visit: Payer: Self-pay

## 2022-06-21 ENCOUNTER — Encounter (HOSPITAL_COMMUNITY): Payer: Self-pay

## 2022-06-21 ENCOUNTER — Encounter (HOSPITAL_COMMUNITY)
Admission: RE | Admit: 2022-06-21 | Discharge: 2022-06-21 | Disposition: A | Discharge status: Home / Self Care | Payer: BC Managed Care – PPO | Source: Ambulatory Visit | Attending: Surgery | Admitting: Surgery

## 2022-06-21 DIAGNOSIS — Z01812 Encounter for preprocedural laboratory examination: Secondary | ICD-10-CM | POA: Diagnosis present

## 2022-06-21 DIAGNOSIS — Z01818 Encounter for other preprocedural examination: Secondary | ICD-10-CM

## 2022-06-21 LAB — CBC
HCT: 41.9 % (ref 39.0–52.0)
Hemoglobin: 13.9 g/dL (ref 13.0–17.0)
MCH: 28.4 pg (ref 26.0–34.0)
MCHC: 33.2 g/dL (ref 30.0–36.0)
MCV: 85.7 fL (ref 80.0–100.0)
Platelets: 203 10*3/uL (ref 150–400)
RBC: 4.89 MIL/uL (ref 4.22–5.81)
RDW: 13 % (ref 11.5–15.5)
WBC: 3.1 10*3/uL — ABNORMAL LOW (ref 4.0–10.5)
nRBC: 0 % (ref 0.0–0.2)

## 2022-06-21 LAB — BASIC METABOLIC PANEL
Anion gap: 7 (ref 5–15)
BUN: 18 mg/dL (ref 6–20)
CO2: 24 mmol/L (ref 22–32)
Calcium: 9.2 mg/dL (ref 8.9–10.3)
Chloride: 106 mmol/L (ref 98–111)
Creatinine, Ser: 1.12 mg/dL (ref 0.61–1.24)
GFR, Estimated: >60 mL/min (ref >60)
Glucose, Bld: 105 mg/dL — ABNORMAL HIGH (ref 70–99)
Potassium: 4.2 mmol/L (ref 3.5–5.1)
Sodium: 137 mmol/L (ref 135–145)

## 2022-06-25 ENCOUNTER — Other Ambulatory Visit: Payer: Self-pay

## 2022-07-01 ENCOUNTER — Encounter (HOSPITAL_COMMUNITY)
Admission: RE | Disposition: A | Discharge status: Home Health | Payer: Self-pay | Source: Home / Self Care | Attending: Surgery

## 2022-07-01 ENCOUNTER — Encounter (HOSPITAL_COMMUNITY): Payer: Self-pay | Admitting: Surgery

## 2022-07-01 ENCOUNTER — Inpatient Hospital Stay (HOSPITAL_COMMUNITY)
Admission: RE | Admit: 2022-07-01 | Discharge: 2022-07-07 | DRG: 330 | Disposition: A | Discharge status: Home Health | Payer: BC Managed Care – PPO | Attending: Surgery | Admitting: Surgery

## 2022-07-01 ENCOUNTER — Inpatient Hospital Stay (HOSPITAL_COMMUNITY): Payer: BC Managed Care – PPO | Admitting: Certified Registered Nurse Anesthetist

## 2022-07-01 ENCOUNTER — Other Ambulatory Visit: Payer: Self-pay

## 2022-07-01 ENCOUNTER — Inpatient Hospital Stay (HOSPITAL_COMMUNITY): Payer: BC Managed Care – PPO | Admitting: Physician Assistant

## 2022-07-01 DIAGNOSIS — K567 Ileus, unspecified: Secondary | ICD-10-CM | POA: Diagnosis not present

## 2022-07-01 DIAGNOSIS — Z9221 Personal history of antineoplastic chemotherapy: Secondary | ICD-10-CM | POA: Diagnosis not present

## 2022-07-01 DIAGNOSIS — E876 Hypokalemia: Secondary | ICD-10-CM | POA: Diagnosis present

## 2022-07-01 DIAGNOSIS — J309 Allergic rhinitis, unspecified: Secondary | ICD-10-CM | POA: Diagnosis present

## 2022-07-01 DIAGNOSIS — Z9049 Acquired absence of other specified parts of digestive tract: Secondary | ICD-10-CM

## 2022-07-01 DIAGNOSIS — Z923 Personal history of irradiation: Secondary | ICD-10-CM | POA: Diagnosis not present

## 2022-07-01 DIAGNOSIS — C20 Malignant neoplasm of rectum: Principal | ICD-10-CM | POA: Diagnosis present

## 2022-07-01 DIAGNOSIS — Z932 Ileostomy status: Secondary | ICD-10-CM

## 2022-07-01 DIAGNOSIS — E7800 Pure hypercholesterolemia, unspecified: Secondary | ICD-10-CM | POA: Diagnosis present

## 2022-07-01 DIAGNOSIS — Z01818 Encounter for other preprocedural examination: Secondary | ICD-10-CM

## 2022-07-01 HISTORY — PX: XI ROBOTIC ASSISTED LOWER ANTERIOR RESECTION: SHX6558

## 2022-07-01 HISTORY — PX: FLEXIBLE SIGMOIDOSCOPY: SHX5431

## 2022-07-01 HISTORY — PX: DIVERTING ILEOSTOMY: SHX5799

## 2022-07-01 LAB — COMPREHENSIVE METABOLIC PANEL
ALT: 32 U/L (ref 0–44)
AST: 25 U/L (ref 15–41)
Albumin: 4.2 g/dL (ref 3.5–5.0)
Alkaline Phosphatase: 73 U/L (ref 38–126)
Anion gap: 9 (ref 5–15)
BUN: 15 mg/dL (ref 6–20)
CO2: 24 mmol/L (ref 22–32)
Calcium: 8.6 mg/dL — ABNORMAL LOW (ref 8.9–10.3)
Chloride: 105 mmol/L (ref 98–111)
Creatinine, Ser: 1.26 mg/dL — ABNORMAL HIGH (ref 0.61–1.24)
GFR, Estimated: >60 mL/min (ref >60)
Glucose, Bld: 174 mg/dL — ABNORMAL HIGH (ref 70–99)
Potassium: 3.1 mmol/L — ABNORMAL LOW (ref 3.5–5.1)
Sodium: 138 mmol/L (ref 135–145)
Total Bilirubin: 0.9 mg/dL (ref 0.3–1.2)
Total Protein: 6.9 g/dL (ref 6.5–8.1)

## 2022-07-01 LAB — CBC WITH DIFFERENTIAL/PLATELET
Abs Immature Granulocytes: 0.01 10*3/uL (ref 0.00–0.07)
Basophils Absolute: 0 10*3/uL (ref 0.0–0.1)
Basophils Relative: 1 %
Eosinophils Absolute: 0.2 10*3/uL (ref 0.0–0.5)
Eosinophils Relative: 5 %
HCT: 40.9 % (ref 39.0–52.0)
Hemoglobin: 13.6 g/dL (ref 13.0–17.0)
Immature Granulocytes: 0 %
Lymphocytes Relative: 27 %
Lymphs Abs: 1 10*3/uL (ref 0.7–4.0)
MCH: 27.7 pg (ref 26.0–34.0)
MCHC: 33.3 g/dL (ref 30.0–36.0)
MCV: 83.3 fL (ref 80.0–100.0)
Monocytes Absolute: 0.5 10*3/uL (ref 0.1–1.0)
Monocytes Relative: 13 %
Neutro Abs: 2 10*3/uL (ref 1.7–7.7)
Neutrophils Relative %: 54 %
Platelets: 199 10*3/uL (ref 150–400)
RBC: 4.91 MIL/uL (ref 4.22–5.81)
RDW: 13.1 % (ref 11.5–15.5)
WBC: 3.7 10*3/uL — ABNORMAL LOW (ref 4.0–10.5)
nRBC: 0 % (ref 0.0–0.2)

## 2022-07-01 LAB — ABO/RH: ABO/RH(D): B POS

## 2022-07-01 LAB — TYPE AND SCREEN
ABO/RH(D): B POS
Antibody Screen: NEGATIVE

## 2022-07-01 LAB — HEMOGLOBIN A1C
Hgb A1c MFr Bld: 6.3 % — ABNORMAL HIGH (ref 4.8–5.6)
Mean Plasma Glucose: 134.11 mg/dL

## 2022-07-01 SURGERY — RESECTION, RECTUM, LOW ANTERIOR, ROBOT-ASSISTED
Anesthesia: General

## 2022-07-01 MED ORDER — ALUM & MAG HYDROXIDE-SIMETH 200-200-20 MG/5ML PO SUSP: 30.0000 mL | Freq: Four times a day (QID) | ORAL | Status: DC | PRN

## 2022-07-01 MED ORDER — DEXAMETHASONE SODIUM PHOSPHATE 10 MG/ML IJ SOLN
INTRAMUSCULAR | Status: AC
  Filled 2022-07-01: qty 1

## 2022-07-01 MED ORDER — ENSURE PRE-SURGERY PO LIQD: 296.0000 mL | Freq: Once | ORAL | Status: DC

## 2022-07-01 MED ORDER — ALVIMOPAN 12 MG PO CAPS
12.0000 mg | ORAL_CAPSULE | ORAL | Status: AC
  Administered 2022-07-01: 12 mg via ORAL
  Filled 2022-07-01: qty 1

## 2022-07-01 MED ORDER — ENSURE PRE-SURGERY PO LIQD: 592.0000 mL | Freq: Once | ORAL | Status: DC

## 2022-07-01 MED ORDER — ONDANSETRON HCL 4 MG PO TABS: 4.0000 mg | ORAL_TABLET | Freq: Four times a day (QID) | ORAL | Status: DC | PRN

## 2022-07-01 MED ORDER — SUGAMMADEX SODIUM 200 MG/2ML IV SOLN
INTRAVENOUS | Status: DC | PRN
  Administered 2022-07-01: 200 mg via INTRAVENOUS

## 2022-07-01 MED ORDER — LACTATED RINGERS IR SOLN
Status: DC | PRN
  Administered 2022-07-01: 1000 mL

## 2022-07-01 MED ORDER — ORAL CARE MOUTH RINSE: 15.0000 mL | Freq: Once | OROMUCOSAL | Status: AC

## 2022-07-01 MED ORDER — BUPIVACAINE LIPOSOME 1.3 % IJ SUSP
INTRAMUSCULAR | Status: AC
  Filled 2022-07-01: qty 20

## 2022-07-01 MED ORDER — ALVIMOPAN 12 MG PO CAPS
12.0000 mg | ORAL_CAPSULE | Freq: Two times a day (BID) | ORAL | Status: DC
  Filled 2022-07-01: qty 1

## 2022-07-01 MED ORDER — ONDANSETRON HCL 4 MG/2ML IJ SOLN
INTRAMUSCULAR | Status: AC
  Filled 2022-07-01: qty 2

## 2022-07-01 MED ORDER — HYDRALAZINE HCL 20 MG/ML IJ SOLN: 10.0000 mg | INTRAMUSCULAR | Status: DC | PRN

## 2022-07-01 MED ORDER — LIDOCAINE HCL 2 % IJ SOLN
INTRAMUSCULAR | Status: AC
  Filled 2022-07-01: qty 20

## 2022-07-01 MED ORDER — IBUPROFEN 400 MG PO TABS
600.0000 mg | ORAL_TABLET | Freq: Four times a day (QID) | ORAL | Status: DC | PRN
  Administered 2022-07-01 – 2022-07-06 (×3): 600 mg via ORAL
  Filled 2022-07-01 (×3): qty 1

## 2022-07-01 MED ORDER — LACTATED RINGERS IV SOLN: INTRAVENOUS | Status: DC

## 2022-07-01 MED ORDER — FENTANYL CITRATE (PF) 100 MCG/2ML IJ SOLN
INTRAMUSCULAR | Status: AC
  Filled 2022-07-01: qty 2

## 2022-07-01 MED ORDER — OXYCODONE HCL 5 MG PO TABS: 5.0000 mg | ORAL_TABLET | Freq: Once | ORAL | Status: DC | PRN

## 2022-07-01 MED ORDER — ROCURONIUM BROMIDE 10 MG/ML (PF) SYRINGE
PREFILLED_SYRINGE | INTRAVENOUS | Status: DC | PRN
  Administered 2022-07-01: 60 mg via INTRAVENOUS
  Administered 2022-07-01: 30 mg via INTRAVENOUS
  Administered 2022-07-01: 20 mg via INTRAVENOUS

## 2022-07-01 MED ORDER — ENSURE SURGERY PO LIQD
237.0000 mL | Freq: Two times a day (BID) | ORAL | Status: DC
  Administered 2022-07-02 – 2022-07-04 (×3): 237 mL via ORAL

## 2022-07-01 MED ORDER — HYDROMORPHONE HCL 1 MG/ML IJ SOLN
0.5000 mg | INTRAMUSCULAR | Status: DC | PRN
  Administered 2022-07-02 – 2022-07-03 (×6): 0.5 mg via INTRAVENOUS
  Filled 2022-07-01 (×6): qty 0.5

## 2022-07-01 MED ORDER — FENTANYL CITRATE (PF) 250 MCG/5ML IJ SOLN
INTRAMUSCULAR | Status: AC
  Filled 2022-07-01: qty 5

## 2022-07-01 MED ORDER — PROPOFOL 10 MG/ML IV BOLUS
INTRAVENOUS | Status: AC
Start: 2022-07-01 — End: ?
  Filled 2022-07-01: qty 20

## 2022-07-01 MED ORDER — GLYCOPYRROLATE 0.2 MG/ML IJ SOLN
INTRAMUSCULAR | Status: DC | PRN
  Administered 2022-07-01: .2 mg via INTRAVENOUS

## 2022-07-01 MED ORDER — OXYCODONE HCL 5 MG/5ML PO SOLN: 5.0000 mg | Freq: Once | ORAL | Status: DC | PRN

## 2022-07-01 MED ORDER — ACETAMINOPHEN 500 MG PO TABS
1000.0000 mg | ORAL_TABLET | Freq: Four times a day (QID) | ORAL | Status: DC
  Administered 2022-07-01 – 2022-07-07 (×19): 1000 mg via ORAL
  Filled 2022-07-01 (×23): qty 2

## 2022-07-01 MED ORDER — KETAMINE HCL 50 MG/5ML IJ SOSY
PREFILLED_SYRINGE | INTRAMUSCULAR | Status: AC
  Filled 2022-07-01: qty 5

## 2022-07-01 MED ORDER — BUPIVACAINE LIPOSOME 1.3 % IJ SUSP
INTRAMUSCULAR | Status: DC | PRN
  Administered 2022-07-01: 20 mL

## 2022-07-01 MED ORDER — POLYETHYLENE GLYCOL 3350 17 GM/SCOOP PO POWD: 1.0000 | Freq: Once | ORAL | Status: DC

## 2022-07-01 MED ORDER — DIPHENHYDRAMINE HCL 50 MG/ML IJ SOLN: 12.5000 mg | Freq: Four times a day (QID) | INTRAMUSCULAR | Status: DC | PRN

## 2022-07-01 MED ORDER — ROCURONIUM BROMIDE 10 MG/ML (PF) SYRINGE
PREFILLED_SYRINGE | INTRAVENOUS | Status: AC
  Filled 2022-07-01: qty 10

## 2022-07-01 MED ORDER — ONDANSETRON HCL 4 MG/2ML IJ SOLN: 4.0000 mg | Freq: Four times a day (QID) | INTRAMUSCULAR | Status: DC | PRN

## 2022-07-01 MED ORDER — 0.9 % SODIUM CHLORIDE (POUR BTL) OPTIME
TOPICAL | Status: DC | PRN
  Administered 2022-07-01: 1000 mL

## 2022-07-01 MED ORDER — DIPHENHYDRAMINE HCL 12.5 MG/5ML PO ELIX: 12.5000 mg | ORAL_SOLUTION | Freq: Four times a day (QID) | ORAL | Status: DC | PRN

## 2022-07-01 MED ORDER — MIDAZOLAM HCL 2 MG/2ML IJ SOLN
INTRAMUSCULAR | Status: AC
  Filled 2022-07-01: qty 2

## 2022-07-01 MED ORDER — EPHEDRINE SULFATE-NACL 50-0.9 MG/10ML-% IV SOSY
PREFILLED_SYRINGE | INTRAVENOUS | Status: DC | PRN
  Administered 2022-07-01: 5 mg via INTRAVENOUS

## 2022-07-01 MED ORDER — LIDOCAINE 2% (20 MG/ML) 5 ML SYRINGE
INTRAMUSCULAR | Status: DC | PRN
  Administered 2022-07-01: 1.5 mg/kg/h via INTRAVENOUS
  Administered 2022-07-01: 60 mg via INTRAVENOUS

## 2022-07-01 MED ORDER — INDOCYANINE GREEN 25 MG IV SOLR
INTRAVENOUS | Status: DC | PRN
  Administered 2022-07-01: 2.5 mg via INTRAVENOUS

## 2022-07-01 MED ORDER — CHLORHEXIDINE GLUCONATE CLOTH 2 % EX PADS: 6.0000 | MEDICATED_PAD | Freq: Once | CUTANEOUS | Status: DC

## 2022-07-01 MED ORDER — SODIUM CHLORIDE (PF) 0.9 % IJ SOLN
INTRAMUSCULAR | Status: AC
  Filled 2022-07-01: qty 30

## 2022-07-01 MED ORDER — LACTATED RINGERS IV SOLN: INTRAVENOUS | Status: DC | PRN

## 2022-07-01 MED ORDER — HEPARIN SODIUM (PORCINE) 5000 UNIT/ML IJ SOLN
5000.0000 [IU] | Freq: Three times a day (TID) | INTRAMUSCULAR | Status: DC
Start: 2022-07-01 — End: 2022-07-07
  Administered 2022-07-01 – 2022-07-07 (×18): 5000 [IU] via SUBCUTANEOUS
  Filled 2022-07-01 (×18): qty 1

## 2022-07-01 MED ORDER — BISACODYL 5 MG PO TBEC: 20.0000 mg | DELAYED_RELEASE_TABLET | Freq: Once | ORAL | Status: DC

## 2022-07-01 MED ORDER — PROPOFOL 10 MG/ML IV BOLUS
INTRAVENOUS | Status: DC | PRN
  Administered 2022-07-01: 170 mg via INTRAVENOUS

## 2022-07-01 MED ORDER — BUPIVACAINE-EPINEPHRINE (PF) 0.25% -1:200000 IJ SOLN
INTRAMUSCULAR | Status: AC
  Filled 2022-07-01: qty 30

## 2022-07-01 MED ORDER — NEOMYCIN SULFATE 500 MG PO TABS: 1000.0000 mg | ORAL_TABLET | ORAL | Status: DC

## 2022-07-01 MED ORDER — MIDAZOLAM HCL 5 MG/5ML IJ SOLN
INTRAMUSCULAR | Status: DC | PRN
  Administered 2022-07-01: 2 mg via INTRAVENOUS

## 2022-07-01 MED ORDER — HEPARIN SODIUM (PORCINE) 5000 UNIT/ML IJ SOLN
5000.0000 [IU] | Freq: Once | INTRAMUSCULAR | Status: AC
  Administered 2022-07-01: 5000 [IU] via SUBCUTANEOUS
  Filled 2022-07-01: qty 1

## 2022-07-01 MED ORDER — ONDANSETRON HCL 4 MG/2ML IJ SOLN
INTRAMUSCULAR | Status: DC | PRN
  Administered 2022-07-01: 4 mg via INTRAVENOUS

## 2022-07-01 MED ORDER — BUPIVACAINE-EPINEPHRINE (PF) 0.25% -1:200000 IJ SOLN
INTRAMUSCULAR | Status: DC | PRN
  Administered 2022-07-01: 30 mL

## 2022-07-01 MED ORDER — KETAMINE HCL 10 MG/ML IJ SOLN
INTRAMUSCULAR | Status: DC | PRN
  Administered 2022-07-01: 30 mg via INTRAVENOUS
  Administered 2022-07-01 (×2): 10 mg via INTRAVENOUS

## 2022-07-01 MED ORDER — METRONIDAZOLE 500 MG PO TABS: 1000.0000 mg | ORAL_TABLET | ORAL | Status: DC

## 2022-07-01 MED ORDER — LIDOCAINE HCL (PF) 2 % IJ SOLN
INTRAMUSCULAR | Status: AC
Start: 2022-07-01 — End: ?
  Filled 2022-07-01: qty 5

## 2022-07-01 MED ORDER — TRAMADOL HCL 50 MG PO TABS
50.0000 mg | ORAL_TABLET | Freq: Four times a day (QID) | ORAL | Status: DC | PRN
  Administered 2022-07-02 – 2022-07-03 (×3): 50 mg via ORAL
  Filled 2022-07-01 (×3): qty 1

## 2022-07-01 MED ORDER — CHLORHEXIDINE GLUCONATE 0.12 % MT SOLN
15.0000 mL | Freq: Once | OROMUCOSAL | Status: AC
  Administered 2022-07-01: 15 mL via OROMUCOSAL

## 2022-07-01 MED ORDER — SODIUM CHLORIDE 0.9 % IV SOLN
2.0000 g | INTRAVENOUS | Status: AC
  Administered 2022-07-01: 2 g via INTRAVENOUS
  Filled 2022-07-01: qty 2

## 2022-07-01 MED ORDER — PHENYLEPHRINE HCL-NACL 20-0.9 MG/250ML-% IV SOLN
INTRAVENOUS | Status: DC | PRN
  Administered 2022-07-01: 35 ug/min via INTRAVENOUS

## 2022-07-01 MED ORDER — FENTANYL CITRATE (PF) 250 MCG/5ML IJ SOLN
INTRAMUSCULAR | Status: DC | PRN
  Administered 2022-07-01: 50 ug via INTRAVENOUS
  Administered 2022-07-01: 100 ug via INTRAVENOUS
  Administered 2022-07-01 (×4): 50 ug via INTRAVENOUS

## 2022-07-01 MED ORDER — DEXAMETHASONE SODIUM PHOSPHATE 10 MG/ML IJ SOLN
INTRAMUSCULAR | Status: DC | PRN
  Administered 2022-07-01: 8 mg via INTRAVENOUS

## 2022-07-01 MED ORDER — SIMETHICONE 80 MG PO CHEW: 40.0000 mg | CHEWABLE_TABLET | Freq: Four times a day (QID) | ORAL | Status: DC | PRN

## 2022-07-01 MED ORDER — FENTANYL CITRATE PF 50 MCG/ML IJ SOSY: 25.0000 ug | PREFILLED_SYRINGE | INTRAMUSCULAR | Status: DC | PRN

## 2022-07-01 SURGICAL SUPPLY — 123 items
APPLIER CLIP 5 13 M/L LIGAMAX5 (MISCELLANEOUS)
APPLIER CLIP ROT 10 11.4 M/L (STAPLE)
BAG COUNTER SPONGE SURGICOUNT (BAG) IMPLANT
BLADE EXTENDED COATED 6.5IN (ELECTRODE) ×2 IMPLANT
CANNULA REDUC XI 12-8 STAPL (CANNULA) ×1
CANNULA REDUCER 12-8 DVNC XI (CANNULA) ×1 IMPLANT
CELLS DAT CNTRL 66122 CELL SVR (MISCELLANEOUS) ×1 IMPLANT
CHLORAPREP W/TINT 26 (MISCELLANEOUS) ×2 IMPLANT
CLIP APPLIE 5 13 M/L LIGAMAX5 (MISCELLANEOUS) IMPLANT
CLIP APPLIE ROT 10 11.4 M/L (STAPLE) IMPLANT
CLIP LIGATING HEM O LOK PURPLE (MISCELLANEOUS) IMPLANT
CLIP LIGATING HEMO O LOK GREEN (MISCELLANEOUS) IMPLANT
COVER SURGICAL LIGHT HANDLE (MISCELLANEOUS) ×4 IMPLANT
COVER TIP SHEARS 8 DVNC (MISCELLANEOUS) ×1 IMPLANT
COVER TIP SHEARS 8MM DA VINCI (MISCELLANEOUS) ×1
DEFOGGER SCOPE WARMER CLEARIFY (MISCELLANEOUS) ×2 IMPLANT
DEVICE TROCAR PUNCTURE CLOSURE (ENDOMECHANICALS) IMPLANT
DRAIN CHANNEL 19F RND (DRAIN) ×1 IMPLANT
DRAPE ARM DVNC X/XI (DISPOSABLE) ×4 IMPLANT
DRAPE COLUMN DVNC XI (DISPOSABLE) ×1 IMPLANT
DRAPE DA VINCI XI ARM (DISPOSABLE) ×4
DRAPE DA VINCI XI COLUMN (DISPOSABLE) ×1
DRAPE SURG IRRIG POUCH 19X23 (DRAPES) ×1 IMPLANT
DRSG OPSITE POSTOP 4X10 (GAUZE/BANDAGES/DRESSINGS) IMPLANT
DRSG OPSITE POSTOP 4X6 (GAUZE/BANDAGES/DRESSINGS) IMPLANT
DRSG OPSITE POSTOP 4X8 (GAUZE/BANDAGES/DRESSINGS) IMPLANT
DRSG TEGADERM 2-3/8X2-3/4 SM (GAUZE/BANDAGES/DRESSINGS) ×10 IMPLANT
DRSG TEGADERM 4X4.75 (GAUZE/BANDAGES/DRESSINGS) ×1 IMPLANT
ELECT REM PT RETURN 15FT ADLT (MISCELLANEOUS) ×2 IMPLANT
ENDOLOOP SUT PDS II  0 18 (SUTURE)
ENDOLOOP SUT PDS II 0 18 (SUTURE) IMPLANT
EVACUATOR SILICONE 100CC (DRAIN) ×1 IMPLANT
GAUZE SPONGE 2X2 8PLY STRL LF (GAUZE/BANDAGES/DRESSINGS) ×1 IMPLANT
GAUZE SPONGE 4X4 12PLY STRL (GAUZE/BANDAGES/DRESSINGS) IMPLANT
GLOVE BIO SURGEON STRL SZ7.5 (GLOVE) ×6 IMPLANT
GLOVE INDICATOR 8.0 STRL GRN (GLOVE) ×6 IMPLANT
GOWN SRG XL LVL 4 BRTHBL STRL (GOWNS) ×1 IMPLANT
GOWN STRL NON-REIN XL LVL4 (GOWNS) ×1
GOWN STRL REUS W/ TWL XL LVL3 (GOWN DISPOSABLE) ×5 IMPLANT
GOWN STRL REUS W/TWL XL LVL3 (GOWN DISPOSABLE) ×5
GRASPER SUT TROCAR 14GX15 (MISCELLANEOUS) IMPLANT
HOLDER FOLEY CATH W/STRAP (MISCELLANEOUS) ×2 IMPLANT
IRRIG SUCT STRYKERFLOW 2 WTIP (MISCELLANEOUS) ×2
IRRIGATION SUCT STRKRFLW 2 WTP (MISCELLANEOUS) ×1 IMPLANT
KIT PROCEDURE DA VINCI SI (MISCELLANEOUS)
KIT PROCEDURE DVNC SI (MISCELLANEOUS) IMPLANT
KIT TURNOVER KIT A (KITS) IMPLANT
NDL INSUFFLATION 14GA 120MM (NEEDLE) ×1 IMPLANT
NEEDLE INSUFFLATION 14GA 120MM (NEEDLE) ×2 IMPLANT
PACK CARDIOVASCULAR III (CUSTOM PROCEDURE TRAY) ×2 IMPLANT
PACK COLON (CUSTOM PROCEDURE TRAY) ×2 IMPLANT
PAD POSITIONING PINK XL (MISCELLANEOUS) ×2 IMPLANT
PENCIL SMOKE EVACUATOR (MISCELLANEOUS) ×2 IMPLANT
PROTECTOR NERVE ULNAR (MISCELLANEOUS) ×3 IMPLANT
RELOAD STAPLE 45 3.5 BLU DVNC (STAPLE) IMPLANT
RELOAD STAPLE 45 4.3 GRN DVNC (STAPLE) IMPLANT
RELOAD STAPLE 60 3.5 BLU DVNC (STAPLE) IMPLANT
RELOAD STAPLE 60 4.3 GRN DVNC (STAPLE) IMPLANT
RELOAD STAPLE 60 BLK VRY/THCK (STAPLE) IMPLANT
RELOAD STAPLER 3.5X45 BLU DVNC (STAPLE) IMPLANT
RELOAD STAPLER 3.5X60 BLU DVNC (STAPLE) IMPLANT
RELOAD STAPLER 4.3X45 GRN DVNC (STAPLE) ×4 IMPLANT
RELOAD STAPLER 4.3X60 GRN DVNC (STAPLE) IMPLANT
RELOAD STAPLER 60MM BLK (STAPLE) ×2 IMPLANT
RETRACTOR WND ALEXIS 18 MED (MISCELLANEOUS) IMPLANT
RTRCTR WOUND ALEXIS 18CM MED (MISCELLANEOUS) ×2
SCISSORS LAP 5X35 DISP (ENDOMECHANICALS) IMPLANT
SEAL CANN UNIV 5-8 DVNC XI (MISCELLANEOUS) ×4 IMPLANT
SEAL XI 5MM-8MM UNIVERSAL (MISCELLANEOUS) ×4
SEALER VESSEL DA VINCI XI (MISCELLANEOUS) ×1
SEALER VESSEL EXT DVNC XI (MISCELLANEOUS) ×1 IMPLANT
SLEEVE ADV FIXATION 5X100MM (TROCAR) IMPLANT
SOLUTION ELECTROLUBE (MISCELLANEOUS) ×2 IMPLANT
SPIKE FLUID TRANSFER (MISCELLANEOUS) ×2 IMPLANT
SPONGE GAUZE 2X2 STER 10/PKG (GAUZE/BANDAGES/DRESSINGS) ×1
STAPLER 45 DA VINCI SURE FORM (STAPLE) ×1
STAPLER 45 SUREFORM DVNC (STAPLE) IMPLANT
STAPLER CANNULA SEAL DVNC XI (STAPLE) ×1 IMPLANT
STAPLER CANNULA SEAL XI (STAPLE) ×1
STAPLER ECHELON LONG 60 440 (INSTRUMENTS) ×1 IMPLANT
STAPLER ECHELON POWER CIR 29 (STAPLE) ×1 IMPLANT
STAPLER ECHELON POWER CIR 31 (STAPLE) IMPLANT
STAPLER RELOAD 3.5X45 BLU DVNC (STAPLE)
STAPLER RELOAD 3.5X45 BLUE (STAPLE)
STAPLER RELOAD 3.5X60 BLU DVNC (STAPLE)
STAPLER RELOAD 3.5X60 BLUE (STAPLE)
STAPLER RELOAD 4.3X45 GREEN (STAPLE) ×4
STAPLER RELOAD 4.3X45 GRN DVNC (STAPLE) ×4
STAPLER RELOAD 4.3X60 GREEN (STAPLE)
STAPLER RELOAD 4.3X60 GRN DVNC (STAPLE)
STAPLER RELOAD 60MM BLK (STAPLE) ×4
STOPCOCK 4 WAY LG BORE MALE ST (IV SETS) ×2 IMPLANT
SURGILUBE 2OZ TUBE FLIPTOP (MISCELLANEOUS) ×1 IMPLANT
SUT MNCRL AB 4-0 PS2 18 (SUTURE) ×2 IMPLANT
SUT PDS AB 1 CT1 27 (SUTURE) IMPLANT
SUT PDS AB 1 TP1 96 (SUTURE) IMPLANT
SUT PROLENE 0 CT 2 (SUTURE) IMPLANT
SUT PROLENE 2 0 KS (SUTURE) ×2 IMPLANT
SUT PROLENE 2 0 SH DA (SUTURE) IMPLANT
SUT SILK 2 0 (SUTURE)
SUT SILK 2 0 SH CR/8 (SUTURE) IMPLANT
SUT SILK 2-0 18XBRD TIE 12 (SUTURE) IMPLANT
SUT SILK 3 0 (SUTURE) ×1
SUT SILK 3 0 SH CR/8 (SUTURE) ×2 IMPLANT
SUT SILK 3-0 18XBRD TIE 12 (SUTURE) ×1 IMPLANT
SUT V-LOC BARB 180 2/0GR6 GS22 (SUTURE)
SUT VIC AB 3-0 SH 18 (SUTURE) ×2 IMPLANT
SUT VIC AB 3-0 SH 27 (SUTURE)
SUT VIC AB 3-0 SH 27XBRD (SUTURE) IMPLANT
SUT VICRYL 0 UR6 27IN ABS (SUTURE) ×2 IMPLANT
SUTURE V-LC BRB 180 2/0GR6GS22 (SUTURE) IMPLANT
SYR 10ML LL (SYRINGE) ×2 IMPLANT
SYS LAPSCP GELPORT 120MM (MISCELLANEOUS)
SYS WOUND ALEXIS 18CM MED (MISCELLANEOUS) ×2
SYSTEM LAPSCP GELPORT 120MM (MISCELLANEOUS) IMPLANT
SYSTEM WOUND ALEXIS 18CM MED (MISCELLANEOUS) ×1 IMPLANT
TAPE UMBILICAL 1/8 X36 TWILL (MISCELLANEOUS) ×2 IMPLANT
TOWEL OR NON WOVEN STRL DISP B (DISPOSABLE) ×2 IMPLANT
TRAY FOLEY MTR SLVR 16FR STAT (SET/KITS/TRAYS/PACK) ×2 IMPLANT
TROCAR ADV FIXATION 5X100MM (TROCAR) ×2 IMPLANT
TROCAR Z THREAD OPTICAL 12X100 (TROCAR) ×1 IMPLANT
TUBING CONNECTING 10 (TUBING) ×4 IMPLANT
TUBING INSUFFLATION 10FT LAP (TUBING) ×2 IMPLANT

## 2022-07-01 NOTE — Op Note (Signed)
PATIENT: Bryan [name]  51 y.o. male  Patient Care Team: Tysinger, Camelia Eng, PA-C as PCP - General (Family Medicine) Ileana Roup, MD as Consulting Physician (General Surgery) Truitt Merle, MD as Consulting Physician (Hematology) Kyung Rudd, MD as Consulting Physician (Radiation Oncology) Alla Feeling, NP as Nurse Practitioner (Nurse Practitioner) Royston Bake, RN as Registered Nurse Noreene Filbert, MD as Consulting Physician (Radiation Oncology)  PREOP DIAGNOSIS: RECTAL CANCER  POSTOP DIAGNOSIS: RECTAL CANCER  PROCEDURE:  Robotic-assisted ultra-low anterior resection with double stapled coloanal anastomosis Diagnostic flexible sigmoidoscopy (necessary to localize lesion) Intraoperative assessment of perfusion using ICG fluorescence imaging Bilateral transversus abdominus plane (TAP) blocks  SURGEON: Sharon Mt. Tinzlee Craker, MD  ASSISTANT: Leighton Ruff, MD  ANESTHESIA: General endotracheal  EBL: 50 mL Total I/O In: 1100 [I.V.:1000; IV Piggyback:100] Out: 275 [Urine:225; Blood:50]  DRAINS: 59 Fr round blake drain left draining pelvis  SPECIMEN: Rectosigmoid colon - staple line distal Distal anastomotic donut  COUNTS: Sponge, needle and instrument counts were reported correct x2  FINDINGS:  Normal peritoneal surfaces.  Surface of the liver is normal in appearance.    Dense scar tissue in the deep pelvis consistent with his history of radiation and treatment response  In order to adequately localize the distal resection margin we did have to perform a flexible sigmoidoscopy to visualize the lesion prior to stapling.  We are able to get grossly over a centimeter below the tumor which is demonstrated a dramatic response to treatment.  There is still an ulcer visible on the left lateral wall of the distal rectum.  There is no evident sphincter involvement.    A well perfused, tension free, hemostatic, air tight 29 mm EEA coloanal anastomosis fashioned ~1 cm proximal to  the dentate line.   NARRATIVE: Informed consent was verified. The patient was taken to the operating room, placed supine on the operating table and SCD's were applied. General endotracheal anesthesia was induced without difficulty. He was then positioned in the lithotomy position with Allen stirrups.  Pressure points were evaluated and padded.  A foley catheter was then placed by nursing under sterile conditions. Hair on the abdomen was clipped.  He was secured to the operating table. The abdomen was then prepped and draped in the standard sterile fashion. Surgical timeout was called indicating the correct patient, procedure, positioning and need for preoperative antibiotics.   An OG tube was placed by anesthesia and confirmed to be to suction.  At Palmer's point, a stab incision was created and the Veress needle was introduced into the peritoneal cavity on the first attempt.  Intraperitoneal location was confirmed by the aspiration and saline drop test.  Pneumoperitoneum was established to a maximum pressure of 15 mmHg using CO2.  Following this, the abdomen was marked for planned trocar sites.  Just to the right and cephalad to the umbilicus, an 8 mm incision was created and an 8 mm blunt tipped robotic trocar was cautiously placed into the peritoneal cavity.  The laparoscope was inserted and demonstrated no evidence of trocar site nor Veress needle site complications.  The Veress needle was removed.  Bilateral transversus abdominis plane blocks were then created using a dilute mixture of Exparel with Marcaine.  3 additional 8 mm robotic trochars were placed under direct visualization roughly in a line extending from the right ASIS towards the left upper quadrant. The bladder was inspected and noted to be at/below the pubic symphysis.  A 12 mm trocar was placed at the marked ileostomy site  in the right abdomen. An additional 5 mm assist port was placed in the right lateral abdomen under direct visualization.   The abdomen was surveyed and there was a few omental adhesions to his abdominal wall which were taken down sharply.  The liver surface is grossly normal.  The peritoneal surfaces are grossly normal.  He was positioned in Trendelenburg with the left side tilted slightly up.  Small bowel was carefully retracted out of the pelvis.  The robot was then docked and I went to the console.    The sigmoid colon was readily identified.  Attachments of the sigmoid colon were taken down from the intersigmoid fossa.  The rectosigmoid colon was grasped and elevated anteriorly.  Beginning with a medial to lateral approach, the peritoneum overlying the presacral space was carefully incised.  The TME plane was readily gained working in a plane between the fascia propria of the rectum and the presacral fascia.  Hypogastric nerves were seen going along the the presacral fascia and were protected free of injury.  Working more proximally, the mesorectum and sigmoid mesentery were carefully mobilized off of the peritoneum.  The left ureter was identified and protected free of injury.  The left gonadal vessels were identified and protected.  These were both swept "down."  The superior hemorrhoidal and IMA pedicles were identified. Further mesocolon was mobilized proximally staying in this plane between the retroperitoneum proper and the mesocolon all the way up to the level of the splenic flexure. Attention was then turned to the lateral portion of dissection.  The sigmoid colon was then retracted to the right.  The sigmoid colon was fully mobilized. The descending colon was mobilized by incising the Veasna Santibanez line of Toldt.  This was done all the way up to the level of the splenic flexure.  The associated mesocolon was also mobilized medially.  The left ureter again was confirmed to be well away from the vasculature which had been dissected medially.  The rectosigmoid colon was elevated anteriorly. The left ureter was re-identified. The  IMA was clear of this. The IMA was then divided with the vessel sealer. The stump was inspected and noted to be completely hemostatic with a good seal.  The mesentery was divided out to the point of planned proximal division.  Attention is then directed at the pelvic portion of the procedure.  The rectum is elevated anteriorly and we began with the posterior portion of the TME dissection.  Staying between the fascia propria the rectum and the presacral fascia, the TME plane is developed posteriorly.  This is done all the way down into the deep pelvis again staying just above the hypogastric nerves which are visualized.  This plan is continued down to the level of the pelvic floor musculature.  We then continued dissection laterally and completed it by terminating the dissection anteriorly.  There are dense adhesions in the deep pelvis consistent with his previous treatment and treatment response.  Is not immediately clear where the tumor resides in the distal rectum.  The proximal rectum is then occluded with a bowel clamp.  I then went below to pass the flexible sigmoidoscope to localize the lesion.  The rectum was insufflated.  The flexible sigmoidoscope was advanced to the level of the ulcer which is in the distal rectum along the left lateral wall.  This is approximately 1 x 1 cm in size and appears significantly smaller than previously.  We were then able to confirm under laparoscopic visualization and appropriate  distal resection margin such that we will have approximately 1.5 cm of distal margin.  This is at the level of the proximal anus.  I went back to the console.  At the location of planned distal division, there is a paucity of mesorectal fat.  There were a few additional adhesions in the left pelvis that were taken down with the vessel sealer again maintaining our TME plane.    A 45 mm green load robotic stapler was then placed through the 12 mm port and introduced into the peritoneal cavity.   The first firing of the stapler was successful.  The second firing terminated halfway through due to the tissue thickness.  We confirmed that we were again well distal to tumor which we are.  We then exchanged the robotic stapler for a laparoscopic black load stapler. The rectum was divided essentially flush with the anus. The stump is intact and healthy in appearance.  Inspecting this with the sigmoidoscope demonstrates an intact staple line without any evident defects.   Attention was turned to performing a perfusion test. ICG was administered by anesthesia and at the level of the cleared mesentery proximally, there was excellent uptake of the tracer.  The distal rectal staple line is also well perfused in appearance.  There is a visible pulse in the mesentery out to the level of the cleared colon at the level of the proximal sigmoid colon junction.  This colon is also supple and healthy in appearance without any thickening.  This reached into the deep pelvis without any difficulty and remained in that location without any tension. A locking grasper was then placed on the sigmoid staple line.   Attention was turned to the extracorporeal portion of the procedure.  The robot was undocked.  I scrubbed back in. A Pfannenstiel incision was created approximately 3 fingerbreadths above the pubic symphysis.  The rectus fascia was incised and then elevated.  The rectus muscle was mobilized free of the overlying fascia.  The peritoneum was incised in the midline well above the location of the bladder.  An [place] wound protector was placed.  Towels were placed around the field.  The specimen was passed through the wound protector.  The point of proximal division was identified and was again on a healthy segment of supple colon with a palpable pulse in the mesentery. This was pink in color.  A pursestring device was applied.  A 2-0 Prolene on a Keith needle was passed.  The colon was divided and passed off with the staple  line being distal.  EEA sizers were then introduced and a 29 mm EEA selected.  "Belt loops" consisting of 3-0 silk were placed around the pursestring suture line.  The anvil was placed and the pursestring tied.  A small amount of fat was cleared from the planned anastomosis and no diverticula were apparent within this.  This was placed back into the abdomen and a cap placed over the wound protector port site.  Pneumoperitoneum was reestablished.  I then went below to pass the stapler.  My partner remained above.   I inspected the specimen and opened it further beginning at the proximal end.  Unable to visualize the ulcer/residual cancer in the distal rectum and we do have again >1 cm of distal margin apparent.  The 29 mm stapler was passed and the spike deployed just beside the staple line.  The components were then mated.  Orientation was confirmed such that there is no twisting of the  colon nor small bowel underneath the mesenteric defect.  The seminal vesicles are also carefully retracted anteriorly and ensured to be not included in our staple line. Care was taken to ensure no other structures were incorporated within this either.  The stapler was then closed, held, and fired. This was then removed. The donuts were inspected and noted to be complete.  The distal anastomotic donut was passed off as an additional specimen.  The colon proximal to the anastomosis was then gently occluded. The pelvis was filled with sterile irrigation. Under direct visualization, I passed a flexible sigmoidoscope.  The anastomosis was under water.  With good distention of the anastomosis there was no air leak. The anastomosis pink in appearance.  This is located at 1 cm from the anal verge by flexible sigmoidoscopy and is just above the dentate line.  It is hemostatic.  Additionally, looking from above, there is no tension on the colon or mesentery.  Sigmoidoscope was withdrawn.  Irrigation was evacuated from the pelvis.  The  abdomen and pelvis are surveyed and noted to be completely hemostatic without any apparent injury.  A 19 French round Blake drain was placed into the deep pelvis draining the anastomosis.    Under direct visualization, all trochars are removed.  The [place] wound protector was removed.  Gowns/gloves are changed and a fresh set of clean instruments utilized. Additional sterile drapes were placed around the field.   Through the Pfannenstiel incision, were able to identify the cecum and appendix.  The terminal ileum was identified.  The planned loop ileostomy is identified approximately 20 cm proximal to the ileocecal valve.  The proximal side of this is marked with a purple marker.  The 12 mm trocar was removed and the skin at this location is excised in a wheal configuration.  The rectus fascia is gently widened to accommodate 2 fingers.  The loop ileostomy is then passed through this.  A Kary Kos is placed to assist in maintaining orientation.  We reinspected everything to ensure there is no twisting.  The Pfannenstiel peritoneum was closed with a running 0 Vicryl suture.  The rectus fascia was then closed using 2 running #1 PDS sutures.  The fascia was then palpated and noted to be completely closed.  Additional anesthetic was infiltrated at the Pfannenstiel site.  Sponge, needle, and instrument counts were reported correct x2. 4-0 Monocryl subcuticular suture was used to close the skin of all incision sites.  Dermabond was placed over all incisions.  The drain is attached to bulb suction.  The ileostomy was then Brooked in a loop configuration with the proximal end cephalad using 3-0 Vicryl sutures circumferentially.  This is pink in color.  All counts were reported correct.  An ileostomy appliance is then cut to fit.   He was then taken out of lithotomy, awakened from anesthesia, extubated, and transferred to a stretcher for transport to PACU in satisfactory condition having tolerated the procedure  well.

## 2022-07-01 NOTE — H&P (Signed)
CC: Here today  HPI: Bryan [name] is an 51 y.o. male with history of HLD, whom is seen in the office today as a referral by Dr. Pasty Arch for evaluation of newly diagnosed rectal cancer.   Colonoscopy for hematochezia and rectal pain 09/14/2021 demonstrated for mass in the rectum. Left side. Ulcerated appearance. This was biopsied. Exam otherwise normal. Biopsies returned invasive adenocarcinoma. IHC preserved.  CEA 3.9   CT CAP 09/18/2021 showed the known left rectal mass 3.3 x 1.9 x 4.1 cm. Associated asymmetric haziness in the left perirectal fat with small perirectal lymph nodes. No findings highly suspicious for distant metastatic disease however there are 2 small indeterminate low-density liver lesions. No suspicious findings in the chest. Small perifissural nodules bilaterally, likely benign.  MRI Abdomen (liver) and pelvis (rectal cancer) have not been completed.  He denies any specific complaints today. He reports he will have intermittent pelvic/rectal pains. He will occasionally have some bright red blood per rectum.  He had an MRI abdomen and MRI/MRA of his pelvis completed 09/30/2021 at an outside facility due to some coordination issues with regards to his ability to obtain these studies under insurance coverage. This MRI was not completed with a rectal cancer protocol. It is therefore the recommendation of Dr. Burr Medico that he undergo a repeat MRI.  Outside MRI Abd 09/30/21 -7 mm T2 high signal lesion noted anteriorly in the right lobe of the liver. This lesion is too small to adequately characterize but there is suggestion of peripheral nodular enhancement as may be seen with a small hemangioma.  Outside MRI P + MRA 09/30/21 -"rectal mass correlating with known rectal carcinoma with asymmetric wall thickening, greater on the left. Left border of the rectal mass is indistinct with mild hazy appearance of the adjacent mesorectum. This appearance could be reflective of motion artifact. However  there is suspicion of mild tumor extension into the left aspect of the mesorectum. Tumor additionally narrows the rectal lumen at the site of the mass. Negative for definite significant abdominal or pelvic lymphadenopathy."  Re-read of staging pelvic MRI on 10/13/21 showed stage T3b, N1 disease, with contact with internal sphincter.  He began neoadjuvant chemo with Capox on 10/23/21  He completed chemo/radiation 04/13/2022.  Overall, he reports he is doing well. He did have some issues noted prior to radiation with intermittent pelvic/rectal pains associate with bowel movements. This all subsided following radiation. He denies any blood in his stool. His appetite has been good. He denies any issues with constipation.  CT CAP on 02/18/22 at [facility] showed the rectal mass measuring 3.9 cm, a little smaller in size, but no significant shrinkage. No adenopathy or metastatic disease.   He denies any changes in his health or health history since we met in the office. States he is ready for surgery. Tolerated bowel prep with satisfactory result   PMH: HLD  PSH: Denies  FHx: His mother had breast cancer in her 68s. He has some cousins with unclear malignancies on paternal side. He denies any other known family history of colorectal, breast, endometrial or ovarian cancer  Social Hx: Denies use of tobacco/EtOH/illicit drug. He works for [employer]. He is here today with his wife. They have 1 son.  Past Medical History:  Diagnosis Date   Allergic rhinitis, cause unspecified    spring only   Hypercholesteremia    Rectal cancer ([place]) 09/14/2021    Past Surgical History:  Procedure Laterality Date   None      Family  History  Problem Relation Age of Onset   Cancer Mother 38       Breast   Alcohol abuse Father    Cancer Paternal Aunt        breast   Breast cancer Paternal Aunt    Diabetes Paternal Aunt    Heart disease Paternal Aunt    Breast cancer Cousin    Cancer Paternal Great-grandfather         prostate   Prostate cancer Paternal Great-grandfather    Hypertension Neg Hx    Colon polyps Neg Hx    Esophageal cancer Neg Hx    Rectal cancer Neg Hx    Stomach cancer Neg Hx     Social:  reports that he has never smoked. He has never used smokeless tobacco. He reports that he does not drink alcohol and does not use drugs.  Allergies: No Known Allergies  Medications: I have reviewed the patient's current medications.  Results for orders placed or performed during the hospital encounter of 07/01/22 (from the past 48 hour(s))  CBC with Differential     Status: Abnormal   Collection Time: 07/01/22  5:27 AM  Result Value Ref Range   WBC 3.7 (L) 4.0 - 10.5 K/uL   RBC 4.91 4.22 - 5.81 MIL/uL   Hemoglobin 13.6 13.0 - 17.0 g/dL   HCT 40.9 39.0 - 52.0 %   MCV 83.3 80.0 - 100.0 fL   MCH 27.7 26.0 - 34.0 pg   MCHC 33.3 30.0 - 36.0 g/dL   RDW 13.1 11.5 - 15.5 %   Platelets 199 150 - 400 K/uL   nRBC 0.0 0.0 - 0.2 %   Neutrophils Relative % 54 %   Neutro Abs 2.0 1.7 - 7.7 K/uL   Lymphocytes Relative 27 %   Lymphs Abs 1.0 0.7 - 4.0 K/uL   Monocytes Relative 13 %   Monocytes Absolute 0.5 0.1 - 1.0 K/uL   Eosinophils Relative 5 %   Eosinophils Absolute 0.2 0.0 - 0.5 K/uL   Basophils Relative 1 %   Basophils Absolute 0.0 0.0 - 0.1 K/uL   Immature Granulocytes 0 %   Abs Immature Granulocytes 0.01 0.00 - 0.07 K/uL    Comment: Performed at [facility], [place] [address], [place], [place] [address]  Comprehensive metabolic panel     Status: Abnormal   Collection Time: 07/01/22  5:27 AM  Result Value Ref Range   Sodium 138 135 - 145 mmol/L   Potassium 3.1 (L) 3.5 - 5.1 mmol/L   Chloride 105 98 - 111 mmol/L   CO2 24 22 - 32 mmol/L   Glucose, Bld 174 (H) 70 - 99 mg/dL    Comment: Glucose reference range applies only to samples taken after fasting for at least 8 hours.   BUN 15 6 - 20 mg/dL   Creatinine, Ser 1.26 (H) 0.61 - 1.24 mg/dL   Calcium 8.6 (L) 8.9 - 10.3  mg/dL   Total Protein 6.9 6.5 - 8.1 g/dL   Albumin 4.2 3.5 - 5.0 g/dL   AST 25 15 - 41 U/L   ALT 32 0 - 44 U/L   Alkaline Phosphatase 73 38 - 126 U/L   Total Bilirubin 0.9 0.3 - 1.2 mg/dL   GFR, Estimated >60 >60 mL/min    Comment: (NOTE) Calculated using the CKD-EPI Creatinine Equation (2021)    Anion gap 9 5 - 15    Comment: Performed at [facility], [place] [address]  [place], [place] [address]  ABO/Rh     Status: None   Collection Time: 07/01/22  5:40 AM  Result Value Ref Range   ABO/RH(D)      B POS Performed at [facility], [place] [address], [place], [place] [address]     No results found.  ROS - all of the below systems have been reviewed with the patient and positives are indicated with bold text General: chills, fever or night sweats Eyes: blurry vision or double vision ENT: epistaxis or sore throat Allergy/Immunology: itchy/watery eyes or nasal congestion Hematologic/Lymphatic: bleeding problems, blood clots or swollen lymph nodes Endocrine: temperature intolerance or unexpected weight changes Breast: new or changing breast lumps or nipple discharge Resp: cough, shortness of breath, or wheezing CV: chest pain or dyspnea on exertion GI: as per HPI GU: dysuria, trouble voiding, or hematuria MSK: joint pain or joint stiffness Neuro: TIA or stroke symptoms Derm: pruritus and skin lesion changes Psych: anxiety and depression  PE Blood pressure 130/82, pulse 77, temperature 98.2 F (36.8 C), temperature source Oral, resp. rate 16, height _0  (1.778 m), weight 83.9 kg, SpO2 100 %. Constitutional: NAD; conversant Eyes: Moist conjunctiva; no lid lag Lungs: Normal respiratory effort CV: RRR; no palpable thrills; no pitting edema GI: Abd soft,NT/ND; no palpable hepatosplenomegaly MSK: Normal range of motion of extremities Psychiatric: Appropriate affect; alert and oriented x3  Results for orders placed or performed during the  hospital encounter of 07/01/22 (from the past 48 hour(s))  CBC with Differential     Status: Abnormal   Collection Time: 07/01/22  5:27 AM  Result Value Ref Range   WBC 3.7 (L) 4.0 - 10.5 K/uL   RBC 4.91 4.22 - 5.81 MIL/uL   Hemoglobin 13.6 13.0 - 17.0 g/dL   HCT 40.9 39.0 - 52.0 %   MCV 83.3 80.0 - 100.0 fL   MCH 27.7 26.0 - 34.0 pg   MCHC 33.3 30.0 - 36.0 g/dL   RDW 13.1 11.5 - 15.5 %   Platelets 199 150 - 400 K/uL   nRBC 0.0 0.0 - 0.2 %   Neutrophils Relative % 54 %   Neutro Abs 2.0 1.7 - 7.7 K/uL   Lymphocytes Relative 27 %   Lymphs Abs 1.0 0.7 - 4.0 K/uL   Monocytes Relative 13 %   Monocytes Absolute 0.5 0.1 - 1.0 K/uL   Eosinophils Relative 5 %   Eosinophils Absolute 0.2 0.0 - 0.5 K/uL   Basophils Relative 1 %   Basophils Absolute 0.0 0.0 - 0.1 K/uL   Immature Granulocytes 0 %   Abs Immature Granulocytes 0.01 0.00 - 0.07 K/uL    Comment: Performed at [facility], [place] [address], [place], [place] [address]  Comprehensive metabolic panel     Status: Abnormal   Collection Time: 07/01/22  5:27 AM  Result Value Ref Range   Sodium 138 135 - 145 mmol/L   Potassium 3.1 (L) 3.5 - 5.1 mmol/L   Chloride 105 98 - 111 mmol/L   CO2 24 22 - 32 mmol/L   Glucose, Bld 174 (H) 70 - 99 mg/dL    Comment: Glucose reference range applies only to samples taken after fasting for at least 8 hours.   BUN 15 6 - 20 mg/dL   Creatinine, Ser 1.26 (H) 0.61 - 1.24 mg/dL   Calcium 8.6 (L) 8.9 - 10.3 mg/dL   Total Protein 6.9 6.5 - 8.1 g/dL   Albumin 4.2 3.5 - 5.0 g/dL  AST 25 15 - 41 U/L   ALT 32 0 - 44 U/L   Alkaline Phosphatase 73 38 - 126 U/L   Total Bilirubin 0.9 0.3 - 1.2 mg/dL   GFR, Estimated >60 >60 mL/min    Comment: (NOTE) Calculated using the CKD-EPI Creatinine Equation (2021)    Anion gap 9 5 - 15    Comment: Performed at [facility], [place] [address], [place], [place] [address]  ABO/Rh     Status: None   Collection Time: 07/01/22  5:40 AM   Result Value Ref Range   ABO/RH(D)      B POS Performed at [facility], [place] [address], [place], [place] [address]     No results found.  I have personally reviewed the relevant updated MRI   A/P: Bryan [name] is an 51 y.o. male with hx of HLD here for surgery of rectal cancer, 5-6 cm from anal verge by digital examination  [id number] low rectal cancer - completed TNT - neoadj cXRT was completed 04/13/22   -Follow-up MRI shows significant treatment response with no further contact of sphincter muscle  Following this, we discussed surgery which in his case would be a robotic assisted low anterior resection versus possibility of an abdominoperineal resection. Diverting loop ileostomy versus colostomy based upon the ultimate surgery necessitated to obtain a negative margin. Flexible sigmoidoscopy. Intraoperative assessment of perfusion (ICG).  -The anatomy and physiology of the GI tract was reviewed with the patient. The pathophysiology of rectal cancer was discussed as well with associated pictures. -We have discussed robotic low anterior resection vs abdominoperineal resection, diverting loop ileostomy vs colostomy (possibility that this is permanent was reviewed), flexible sigmoidoscopy, intraoperative assessment of perfusion (ICG)  -The planned procedures, material risks (including, but not limited to, pain, bleeding, infection, scarring, need for blood transfusion, damage to surrounding structures- blood vessels/nerves/viscus/organs, damage to ureter, urine leak, leak from anastomosis, need for additional procedures, sexual dysfunction, scenarios where a stoma may be necessary and where it may be permanent, worsening of pre-existing medical conditions, hernia, recurrence, pneumonia, heart attack, stroke, death) benefits and alternatives to surgery were discussed at length. The patient's questions were answered to his satisfaction, he voiced understanding and elected to  proceed with surgery. Additionally, we discussed typical postoperative expectations and the recovery process.   Nadeen Landau, [facility], [place]

## 2022-07-01 NOTE — Anesthesia Postprocedure Evaluation (Signed)
Anesthesia Post Note  Patient: Bryan [name]  Procedure(s) Performed: XI ROBOTIC ASSISTED LOWER ANTERIOR RESECTION STAPLED COLOANAL ANASTAMOSIS INTRAOP ASSESSMENT OF PERFUSION AND TAPP BLOCKS DIVERTING LOOP ILEOSTOMY FLEXIBLE SIGMOIDOSCOPY     Patient location during evaluation: PACU Anesthesia Type: General Level of consciousness: awake and alert Pain management: pain level controlled Vital Signs Assessment: post-procedure vital signs reviewed and stable Respiratory status: spontaneous breathing, nonlabored ventilation, respiratory function stable and patient connected to nasal cannula oxygen Cardiovascular status: blood pressure returned to baseline and stable Postop Assessment: no apparent nausea or vomiting Anesthetic complications: no   No notable events documented.  Last Vitals:  Vitals:   07/01/22 1405 07/01/22 1503  BP: 127/87 124/82  Pulse: 82 84  Resp: 16 16  Temp: 36.8 C 36.8 C  SpO2: 100% 99%    Last Pain:  Vitals:   07/01/22 1503  TempSrc: Oral  PainSc: 0-No pain                 Bryan [name] S

## 2022-07-01 NOTE — Transfer of Care (Signed)
Immediate Anesthesia Transfer of Care Note  Patient: Damar Petit  Procedure(s) Performed: XI ROBOTIC ASSISTED LOWER ANTERIOR RESECTION STAPLED COLOANAL ANASTAMOSIS INTRAOP ASSESSMENT OF PERFUSION AND TAPP BLOCKS DIVERTING LOOP ILEOSTOMY FLEXIBLE SIGMOIDOSCOPY  Patient Location: PACU  Anesthesia Type:General  Level of Consciousness: awake, alert  and oriented  Airway & Oxygen Therapy: Patient Spontanous Breathing and Patient connected to face mask oxygen  Post-op Assessment: Report given to RN and Post -op Vital signs reviewed and stable  Post vital signs: Reviewed and stable  Last Vitals:  Vitals Value Taken Time  BP 134/86 07/01/22 1135  Temp    Pulse 77 07/01/22 1135  Resp 10 07/01/22 1135  SpO2 100 % 07/01/22 1135    Last Pain:  Vitals:   07/01/22 0542  TempSrc:   PainSc: 0-No pain         Complications: No notable events documented.

## 2022-07-01 NOTE — Anesthesia Procedure Notes (Signed)
Procedure Name: Intubation Date/Time: 07/01/2022 7:39 AM  Performed by: Maxwell Caul, CRNAPre-anesthesia Checklist: Patient identified, Emergency Drugs available, Suction available and Patient being monitored Patient Re-evaluated:Patient Re-evaluated prior to induction Oxygen Delivery Method: Circle system utilized Preoxygenation: Pre-oxygenation with 100% oxygen Induction Type: IV induction Ventilation: Mask ventilation without difficulty Laryngoscope Size: Mac and 4 Grade View: Grade I Tube type: Oral Tube size: 7.5 mm Number of attempts: 1 Airway Equipment and Method: Stylet Placement Confirmation: ETT inserted through vocal cords under direct vision, positive ETCO2 and breath sounds checked- equal and bilateral Secured at: 22 cm Tube secured with: Tape Dental Injury: Teeth and Oropharynx as per pre-operative assessment

## 2022-07-01 NOTE — Anesthesia Preprocedure Evaluation (Signed)
Anesthesia Evaluation  Patient identified by MRN, date of birth, ID band Patient awake    Reviewed: Allergy & Precautions, H&P , NPO status , Patient's Chart, lab work & pertinent test results  Airway Mallampati: II   Neck ROM: full    Dental   Pulmonary neg pulmonary ROS,    breath sounds clear to auscultation       Cardiovascular negative cardio ROS   Rhythm:regular Rate:Normal     Neuro/Psych    GI/Hepatic Rectal CA   Endo/Other    Renal/GU      Musculoskeletal   Abdominal   Peds  Hematology   Anesthesia Other Findings   Reproductive/Obstetrics                             Anesthesia Physical Anesthesia Plan  ASA: 2  Anesthesia Plan: General   Post-op Pain Management:    Induction: Intravenous  PONV Risk Score and Plan: 2 and Ondansetron, Dexamethasone, Midazolam and Treatment may vary due to age or medical condition  Airway Management Planned: Oral ETT  Additional Equipment:   Intra-op Plan:   Post-operative Plan: Extubation in OR  Informed Consent: I have reviewed the patients History and Physical, chart, labs and discussed the procedure including the risks, benefits and alternatives for the proposed anesthesia with the patient or authorized representative who has indicated his/her understanding and acceptance.     Dental advisory given  Plan Discussed with: CRNA, Anesthesiologist and Surgeon  Anesthesia Plan Comments:         Anesthesia Quick Evaluation

## 2022-07-02 ENCOUNTER — Encounter (HOSPITAL_COMMUNITY): Payer: Self-pay | Admitting: Surgery

## 2022-07-02 ENCOUNTER — Other Ambulatory Visit (HOSPITAL_COMMUNITY): Payer: Self-pay

## 2022-07-02 LAB — BASIC METABOLIC PANEL
Anion gap: 7 (ref 5–15)
BUN: 14 mg/dL (ref 6–20)
CO2: 24 mmol/L (ref 22–32)
Calcium: 8.5 mg/dL — ABNORMAL LOW (ref 8.9–10.3)
Chloride: 105 mmol/L (ref 98–111)
Creatinine, Ser: 1.22 mg/dL (ref 0.61–1.24)
GFR, Estimated: >60 mL/min (ref >60)
Glucose, Bld: 129 mg/dL — ABNORMAL HIGH (ref 70–99)
Potassium: 4.2 mmol/L (ref 3.5–5.1)
Sodium: 136 mmol/L (ref 135–145)

## 2022-07-02 LAB — CBC
HCT: 36.7 % — ABNORMAL LOW (ref 39.0–52.0)
Hemoglobin: 12.1 g/dL — ABNORMAL LOW (ref 13.0–17.0)
MCH: 27.8 pg (ref 26.0–34.0)
MCHC: 33 g/dL (ref 30.0–36.0)
MCV: 84.4 fL (ref 80.0–100.0)
Platelets: 171 10*3/uL (ref 150–400)
RBC: 4.35 MIL/uL (ref 4.22–5.81)
RDW: 13.2 % (ref 11.5–15.5)
WBC: 6.9 10*3/uL (ref 4.0–10.5)
nRBC: 0 % (ref 0.0–0.2)

## 2022-07-02 MED ORDER — TRAMADOL HCL 50 MG PO TABS
50.0000 mg | ORAL_TABLET | Freq: Four times a day (QID) | ORAL | 0 refills | Status: AC | PRN
  Filled 2022-07-02: qty 15, 4d supply, fill #0

## 2022-07-02 MED ORDER — KCL IN DEXTROSE-NACL 20-5-0.45 MEQ/L-%-% IV SOLN
INTRAVENOUS | Status: DC
  Filled 2022-07-02 (×8): qty 1000

## 2022-07-02 MED ORDER — CHLORHEXIDINE GLUCONATE CLOTH 2 % EX PADS
6.0000 | MEDICATED_PAD | Freq: Every day | CUTANEOUS | Status: DC
  Administered 2022-07-02: 6 via TOPICAL

## 2022-07-02 MED ORDER — PSYLLIUM 95 % PO PACK
1.0000 | PACK | Freq: Two times a day (BID) | ORAL | Status: DC
  Administered 2022-07-02 – 2022-07-03 (×3): 1 via ORAL
  Filled 2022-07-02 (×3): qty 1

## 2022-07-02 NOTE — Progress Notes (Signed)
  Subjective No acute events. Feeling well. Up walking x2. No nausea/vomiting with clear liquids. Pain well controlled.   Objective: Vital signs in last 24 hours: Temp:  [97.4 F (36.3 C)-99.5 F (37.5 C)] 99 F (37.2 C) (07/07 0622) Pulse Rate:  [70-87] 77 (07/07 0622) Resp:  [7-20] 16 (07/07 0622) BP: (97-142)/(63-91) 97/65 (07/07 0622) SpO2:  [97 %-100 %] 98 % (07/07 0622) Weight:  [86 kg] 86 kg (07/07 0500) Last BM Date : 06/30/22  Intake/Output from previous day: 07/06 0701 - 07/07 0700 In: 3893.6 [P.O.:360; I.V.:3433.6; IV Piggyback:100] Out: 3470 [Urine:2355; Drains:215; Stool:850; Blood:50] Intake/Output this shift: No intake/output data recorded.  Gen: NAD, comfortable CV: RRR Pulm: Normal work of breathing Abd: Soft, minimal incisional tenderness, nondistended; ileostomy pink, productive of thin bilious fluid Ext: SCDs in place  Lab Results: CBC  Recent Labs    07/01/22 0527 07/02/22 0508  WBC 3.7* 6.9  HGB 13.6 12.1*  HCT 40.9 36.7*  PLT 199 171   BMET Recent Labs    07/01/22 0527 07/02/22 0508  NA 138 136  K 3.1* 4.2  CL 105 105  CO2 24 24  GLUCOSE 174* 129*  BUN 15 14  CREATININE 1.26* 1.22  CALCIUM 8.6* 8.5*   PT/INR No results for input(s): "LABPROT", "INR" in the last 72 hours. ABG No results for input(s): "PHART", "HCO3" in the last 72 hours.  Invalid input(s): "PCO2", "PO2"  Studies/Results:  Anti-infectives: Anti-infectives (From admission, onward)    Start     Dose/Rate Route Frequency Ordered Stop   07/01/22 1400  neomycin (MYCIFRADIN) tablet 1,000 mg  Status:  Discontinued       See Hyperspace for full Linked Orders Report.   1,000 mg Oral 3 times per day 07/01/22 0526 07/01/22 0539   07/01/22 1400  metroNIDAZOLE (FLAGYL) tablet 1,000 mg  Status:  Discontinued       See Hyperspace for full Linked Orders Report.   1,000 mg Oral 3 times per day 07/01/22 0526 07/01/22 0539   07/01/22 0600  cefoTEtan (CEFOTAN) 2 g in sodium  chloride 0.9 % 100 mL IVPB        2 g 200 mL/hr over 30 Minutes Intravenous On call to O.R. 07/01/22 0526 07/01/22 0741        Assessment/Plan: Patient Active Problem List   Diagnosis Date Noted   S/P laparoscopic-assisted sigmoidectomy 07/01/2022   Rectal cancer ([place]) 08/18/2021   Routine general medical examination at a health care facility 10/19/2011   Lipoma of abdominal wall 10/19/2011   Tendonitis of elbow, right 10/19/2011   Allergic rhinitis, cause unspecified    s/p Procedure(s): XI ROBOTIC ASSISTED LOWER ANTERIOR RESECTION STAPLED COLOANAL ANASTAMOSIS INTRAOP ASSESSMENT OF PERFUSION AND TAPP BLOCKS DIVERTING LOOP ILEOSTOMY FLEXIBLE SIGMOIDOSCOPY 07/01/2022  -Doing quite well -We spent time reviewing his procedure, findings, and plans moving forward. Discussed general postop expectations, ileostomy and plans regarding this. Wife at bedside today -WOCN to see today to start teaching -BID Metamucil -Full liquids, advance to carb modified as tolerated - avoiding concentrated sweets which may increase osmotic load and therefore ostomy output -Ambulate 5x/day -MIVF until reliably tolerating diet and output <1.2L/day -PPx: SQH, SCDs   LOS: 1 day   Nadeen Landau, MD [facility], [place]

## 2022-07-02 NOTE — Discharge Instructions (Signed)
POST OP INSTRUCTIONS AFTER COLON SURGERY  DIET: Be sure to include lots of fluids daily to stay hydrated - 64oz of water per day (8, 8 oz glasses).  Avoid fast food or heavy meals for the first couple of weeks as your are more likely to get nauseated. Avoid raw/uncooked fruits or vegetables for the first 4 weeks (its ok to have these if they are blended into smoothie form). If you have fruits/vegetables, make sure they are cooked until soft enough to mash on the roof of your mouth and chew your food well. Otherwise, diet as tolerated.  Take your usually prescribed home medications unless otherwise directed.  PAIN CONTROL: Pain is best controlled by a usual combination of three different methods TOGETHER: Ice/Heat Over the counter pain medication Prescription pain medication Most patients will experience some swelling and bruising around the surgical site.  Ice packs or heating pads (30-60 minutes up to 6 times a day) will help. Some people prefer to use ice alone, heat alone, alternating between ice & heat.  Experiment to what works for you.  Swelling and bruising can take several weeks to resolve.   It is helpful to take an over-the-counter pain medication regularly for the first few weeks: Ibuprofen (Motrin/Advil) - '200mg'$  tabs - take 3 tabs ('600mg'$ ) every 6 hours as needed for pain (unless you have been directed previously to avoid NSAIDs/ibuprofen) Acetaminophen (Tylenol) - you may take '650mg'$  every 6 hours as needed. You can take this with motrin as they act differently on the body. If you are taking a narcotic pain medication that has acetaminophen in it, do not take over the counter tylenol at the same time. NOTE: You may take both of these medications together - most patients  find it most helpful when alternating between the two (i.e. Ibuprofen at 6am, tylenol at 9am, ibuprofen at 12pm ..Marland Kitchen) A  prescription for pain medication should be given to you upon discharge.  Take your pain medication as  prescribed if your pain is not adequatly controlled with the over-the-counter pain reliefs mentioned above.  Ileostomy: Drink sugar free gatorade or other water based fluids daily - you need at least 64 oz of water/non-caffeinated/sugar free drinks daily. As instructed by our Ostomy Team - empty and record output, totaling 24 hr volumes and keeping a log. We target a toothpaste consistency and fiber (metamucil, benefiber) works well at thickening it - this will help minimize bag leaks and decrease the volume of output. Goal is <1.2 L (1200 mL) in 24 hours. Higher volumes increase your risk for dehydration. If >1.2L in 24 hrs, please let us know - generally we start Imodium 1 tablet 3 times daily. Drain care: Keep drain site clean/dry - cover when bathing. Empty/record output twice daily and record color. Bring this log with you to your appointment for drain evaluation next week in our office. They will contact you with this appointment information. If you do not hear from my office by Friday, please call and ask for my nurse, Chemira.  Dressing: Your incisions are covered in Dermabond which is like sterile superglue for the skin. This will come off on it's own in a couple weeks. It is waterproof and you may bathe normally starting the day after your surgery in a shower. Avoid baths/pools/lakes/oceans until your wounds have fully healed.  ACTIVITIES as tolerated:   Avoid heavy lifting (>10lbs or 1 gallon of milk) for the next 6 weeks. You may resume regular daily activities as tolerated--such as  daily self-care, walking, climbing stairs--gradually increasing activities as tolerated.  If you can walk 30 minutes without difficulty, it is safe to try more intense activity such as jogging, treadmill, bicycling, low-impact aerobics.  DO NOT PUSH THROUGH PAIN.  Let pain be your guide: If it hurts to do something, don't do it. You may drive when you are no longer taking prescription pain medication, you can  comfortably wear a seatbelt, and you can safely maneuver your car and apply brakes.  FOLLOW UP in our office Please call [facility] at [phone] to set up an appointment to see your surgeon in the office for a follow-up appointment approximately 2 weeks after your surgery. Make sure that you call for this appointment the day you arrive home to insure a convenient appointment time.  9. If you have disability or family leave forms that need to be completed, you may have them completed by your primary care physician's office; for return to work instructions, please ask our office staff and they will be happy to assist you in obtaining this documentation   When to call us [phone]: Poor pain control Reactions / problems with new medications (rash/itching, etc)  Fever over 101.5 F (38.5 C) Inability to urinate Nausea/vomiting Worsening swelling or bruising Continued bleeding from incision. Increased pain, redness, or drainage from the incision  The clinic staff is available to answer your questions during regular business hours (8:30am-5pm).  Please don't hesitate to call and ask to speak to one of our nurses for clinical concerns.   A surgeon from [facility] is always on call at the hospitals   If you have a medical emergency, go to the nearest emergency room or call 911.  [facility], [place] [address], [place], [place], [place]  [address] MAIN: [phone] FAX: [phone] [email or web address]

## 2022-07-02 NOTE — Consult Note (Signed)
[place] Nurse ostomy follow up Patient receiving care in [place]. Spouse present for teaching session. Stoma type/location: RUQ loop ileostomy Stomal assessment/size: 1.5 inches, round, budded, sutures intact, moist Peristomal assessment: intact Treatment options for stomal/peristomal skin: barrier ring Output: thin green effluent Ostomy pouching: 2pc. 2 1/4 inch system. Pouch, Lawson #[phone]; skin barrier, Kellie Simmering #644; barrier ring, Kellie Simmering [phone] Education provided: Patient and spouse shown emptying procedure, entire pouch removal, peristomal cleaning, new pouch preparation and application procedure. Patient able to close sample pouch. Enrolled patient in [place] Start Discharge program: Yes  Education folder provided.  Val Riles, RN, MSN, CWOCN, CNS-BC, pager [phone]

## 2022-07-03 LAB — BASIC METABOLIC PANEL
Anion gap: 8 (ref 5–15)
BUN: 13 mg/dL (ref 6–20)
CO2: 24 mmol/L (ref 22–32)
Calcium: 8.9 mg/dL (ref 8.9–10.3)
Chloride: 101 mmol/L (ref 98–111)
Creatinine, Ser: 1.11 mg/dL (ref 0.61–1.24)
GFR, Estimated: >60 mL/min (ref >60)
Glucose, Bld: 198 mg/dL — ABNORMAL HIGH (ref 70–99)
Potassium: 4.8 mmol/L (ref 3.5–5.1)
Sodium: 133 mmol/L — ABNORMAL LOW (ref 135–145)

## 2022-07-03 LAB — CBC
HCT: 41.2 % (ref 39.0–52.0)
Hemoglobin: 13.7 g/dL (ref 13.0–17.0)
MCH: 27.7 pg (ref 26.0–34.0)
MCHC: 33.3 g/dL (ref 30.0–36.0)
MCV: 83.4 fL (ref 80.0–100.0)
Platelets: 182 10*3/uL (ref 150–400)
RBC: 4.94 MIL/uL (ref 4.22–5.81)
RDW: 13.3 % (ref 11.5–15.5)
WBC: 8.4 10*3/uL (ref 4.0–10.5)
nRBC: 0 % (ref 0.0–0.2)

## 2022-07-03 LAB — GLUCOSE, CAPILLARY: Glucose-Capillary: 154 mg/dL — ABNORMAL HIGH (ref 70–99)

## 2022-07-03 MED ORDER — HYDROMORPHONE 1 MG/ML IV SOLN
INTRAVENOUS | Status: DC
  Administered 2022-07-03: 0.3 mg via INTRAVENOUS
  Administered 2022-07-03: 0.6 mg via INTRAVENOUS
  Administered 2022-07-03: 6.7 mg via INTRAVENOUS
  Administered 2022-07-04: 1.5 mg via INTRAVENOUS
  Administered 2022-07-04: 5.9 mL via INTRAVENOUS
  Administered 2022-07-04: 0.3 mg via INTRAVENOUS
  Administered 2022-07-04 (×3): 0.6 mg via INTRAVENOUS
  Administered 2022-07-05: 0.9 mg via INTRAVENOUS
  Filled 2022-07-03: qty 30

## 2022-07-03 MED ORDER — DIPHENHYDRAMINE HCL 12.5 MG/5ML PO ELIX: 12.5000 mg | ORAL_SOLUTION | Freq: Four times a day (QID) | ORAL | Status: DC | PRN

## 2022-07-03 MED ORDER — ONDANSETRON HCL 4 MG/2ML IJ SOLN
4.0000 mg | Freq: Four times a day (QID) | INTRAMUSCULAR | Status: DC | PRN
Start: 2022-07-03 — End: 2022-07-05
  Administered 2022-07-03 – 2022-07-04 (×2): 4 mg via INTRAVENOUS
  Filled 2022-07-03 (×3): qty 2

## 2022-07-03 MED ORDER — DIPHENHYDRAMINE HCL 50 MG/ML IJ SOLN: 12.5000 mg | Freq: Four times a day (QID) | INTRAMUSCULAR | Status: DC | PRN

## 2022-07-03 MED ORDER — PROCHLORPERAZINE EDISYLATE 10 MG/2ML IJ SOLN
10.0000 mg | Freq: Four times a day (QID) | INTRAMUSCULAR | Status: DC | PRN
  Administered 2022-07-03 – 2022-07-06 (×2): 10 mg via INTRAVENOUS
  Filled 2022-07-03 (×2): qty 2

## 2022-07-03 MED ORDER — SODIUM CHLORIDE 0.9% FLUSH: 9.0000 mL | INTRAVENOUS | Status: DC | PRN

## 2022-07-03 MED ORDER — INSULIN ASPART 100 UNIT/ML IJ SOLN
0.0000 [IU] | Freq: Three times a day (TID) | INTRAMUSCULAR | Status: DC
  Administered 2022-07-04 (×2): 2 [IU] via SUBCUTANEOUS
  Administered 2022-07-04: 3 [IU] via SUBCUTANEOUS
  Administered 2022-07-05: 1 [IU] via SUBCUTANEOUS
  Administered 2022-07-05 (×2): 2 [IU] via SUBCUTANEOUS
  Administered 2022-07-06 (×2): 1 [IU] via SUBCUTANEOUS
  Administered 2022-07-06: 2 [IU] via SUBCUTANEOUS
  Administered 2022-07-07: 1 [IU] via SUBCUTANEOUS

## 2022-07-03 MED ORDER — NALOXONE HCL 0.4 MG/ML IJ SOLN: 0.4000 mg | INTRAMUSCULAR | Status: DC | PRN

## 2022-07-03 MED ORDER — INSULIN ASPART 100 UNIT/ML IJ SOLN: 0.0000 [IU] | Freq: Every day | INTRAMUSCULAR | Status: DC

## 2022-07-03 MED ORDER — SODIUM CHLORIDE 0.9 % IV BOLUS
1000.0000 mL | Freq: Once | INTRAVENOUS | Status: AC
  Administered 2022-07-03: 1000 mL via INTRAVENOUS

## 2022-07-03 NOTE — TOC Initial Note (Signed)
Transition of Care [facility]) - Initial/Assessment Note    Patient Details  Name: Bryan [name] MRN: [id number] Date of Birth: January 25, 1971  [facility]) CM/SW Contact:    Leeroy Cha, RN Phone Number: 07/03/2022, 8:31 AM  Clinical Narrative:                  Transition of Care [facility]) Screening Note   Patient Details  Name: Bryan [name] Date of Birth: [birth year]   Transition of Care [facility]) CM/SW Contact:    Leeroy Cha, RN Phone Number: 07/03/2022, 8:31 AM    Transition of Care Department [facility]) has reviewed patient and no TOC needs have been identified at this time. We will continue to monitor patient advancement through interdisciplinary progression rounds. If new patient transition needs arise, please place a TOC consult.    Expected Discharge Plan: Home/Self Care Barriers to Discharge: Continued Medical Work up   Patient Goals and CMS Choice Patient states their goals for this hospitalization and ongoing recovery are:: to go home CMS Medicare.gov Compare Post Acute Care list provided to:: Patient    Expected Discharge Plan and Services Expected Discharge Plan: Home/Self Care   Discharge Planning Services: CM Consult   Living arrangements for the past 2 months: Single Family Home                                      Prior Living Arrangements/Services Living arrangements for the past 2 months: Single Family Home Lives with:: Spouse Patient language and need for interpreter reviewed:: Yes Do you feel safe going back to the place where you live?: Yes            Criminal Activity/Legal Involvement Pertinent to Current Situation/Hospitalization: No - Comment as needed  Activities of Daily Living Home Assistive Devices/Equipment: None ADL Screening (condition at time of admission) Patient's cognitive ability adequate to safely complete daily activities?: Yes Is the patient deaf or have difficulty hearing?: No Does the patient have difficulty  seeing, even when wearing glasses/contacts?: No Does the patient have difficulty concentrating, remembering, or making decisions?: No Patient able to express need for assistance with ADLs?: Yes Does the patient have difficulty dressing or bathing?: No Independently performs ADLs?: Yes (appropriate for developmental age) Does the patient have difficulty walking or climbing stairs?: No Weakness of Legs: None Weakness of Arms/Hands: None  Permission Sought/Granted                  Emotional Assessment Appearance:: Appears stated age     Orientation: : Oriented to Place, Oriented to Self, Oriented to Situation, Oriented to  Time Alcohol / Substance Use: Not Applicable Psych Involvement: No (comment)  Admission diagnosis:  S/P laparoscopic-assisted sigmoidectomy [Z90.49] Patient Active Problem List   Diagnosis Date Noted   S/P laparoscopic-assisted sigmoidectomy 07/01/2022   Rectal cancer ([place]) 08/18/2021   Routine general medical examination at a health care facility 10/19/2011   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 [place]Phone: [phone]: [phone] [place] [address]  [place] [address] Phone: [phone] Fax: [phone]  [place], [place] [address] [place] [address] Phone: [phone] Fax: [phone]     Social Determinants of Health (SDOH) Interventions    Readmission Risk Interventions     No data to display

## 2022-07-03 NOTE — Progress Notes (Signed)
Assessment & Plan: POD#2 - s/p Procedure(s): XI ROBOTIC ASSISTED LOWER ANTERIOR RESECTION STAPLED COLOANAL ANASTAMOSIS INTRAOP ASSESSMENT OF PERFUSION AND TAPP BLOCKS DIVERTING LOOP ILEOSTOMY FLEXIBLE SIGMOIDOSCOPY 07/01/2022   - pain control an issue - will try Dilaudid PCA today - WOCN following - BID Metamucil - Full liquids, advance to carb modified as tolerated - avoiding concentrated sweets which may increase osmotic load and therefore ostomy output - Ambulate 5x/day - MIVF until reliably tolerating diet and output <1.2L/day        Armandina Gemma, MD [facility] A [place] practice Office: [phone]        Chief Complaint: Rectal cancer  Subjective: Patient in bed, wife at bedside.  Taking limited full liquids.  Moderate ostomy output.  Ambulating in halls.  Complains of poor pain control - requiring intermittent IV dilaudid.  Objective: Vital signs in last 24 hours: Temp:  [98.4 F (36.9 C)-98.9 F (37.2 C)] 98.7 F (37.1 C) (07/08 0426) Pulse Rate:  [82-85] 83 (07/08 0426) Resp:  [16] 16 (07/08 0426) BP: (103-117)/(66-71) 117/71 (07/08 0426) SpO2:  [96 %-98 %] 96 % (07/08 0426) Weight:  [81.8 kg] 81.8 kg (07/08 0433) Last BM Date : 07/02/22  Intake/Output from previous day: 07/07 0701 - 07/08 0700 In: 1886.3 [P.O.:660; I.V.:1226.3] Out: 3610 [Urine:1050; Drains:35; Stool:2525] Intake/Output this shift: No intake/output data recorded.  Physical Exam: HEENT - sclerae clear, mucous membranes moist Neck - soft Abdomen - mild distension; thin succus in ostomy; JP drain with small serous output Ext - no edema, non-tender Neuro - alert & oriented, no focal deficits  Lab Results:  Recent Labs    07/02/22 0508 07/03/22 0531  WBC 6.9 8.4  HGB 12.1* 13.7  HCT 36.7* 41.2  PLT 171 182   BMET Recent Labs    07/02/22 0508 07/03/22 0531  NA 136 133*  K 4.2 4.8  CL 105 101  CO2 24 24  GLUCOSE 129* 198*  BUN 14 13  CREATININE 1.22 1.11   CALCIUM 8.5* 8.9   PT/INR No results for input(s): "LABPROT", "INR" in the last 72 hours. Comprehensive Metabolic Panel:    Component Value Date/Time   NA 133 (L) 07/03/2022 0531   NA 136 07/02/2022 0508   K 4.8 07/03/2022 0531   K 4.2 07/02/2022 0508   CL 101 07/03/2022 0531   CL 105 07/02/2022 0508   CO2 24 07/03/2022 0531   CO2 24 07/02/2022 0508   BUN 13 07/03/2022 0531   BUN 14 07/02/2022 0508   CREATININE 1.11 07/03/2022 0531   CREATININE 1.22 07/02/2022 0508   CREATININE 1.24 05/14/2022 1406   CREATININE 1.13 02/05/2022 1221   GLUCOSE 198 (H) 07/03/2022 0531   GLUCOSE 129 (H) 07/02/2022 0508   CALCIUM 8.9 07/03/2022 0531   CALCIUM 8.5 (L) 07/02/2022 0508   AST 25 07/01/2022 0527   AST 32 05/14/2022 1406   AST 24 04/12/2022 1250   AST 24 02/05/2022 1221   ALT 32 07/01/2022 0527   ALT 49 (H) 05/14/2022 1406   ALT 30 04/12/2022 1250   ALT 29 02/05/2022 1221   ALKPHOS 73 07/01/2022 0527   ALKPHOS 100 05/14/2022 1406   BILITOT 0.9 07/01/2022 0527   BILITOT 0.5 05/14/2022 1406   BILITOT 0.4 04/12/2022 1250   BILITOT 0.7 02/05/2022 1221   PROT 6.9 07/01/2022 0527   PROT 6.9 05/14/2022 1406   ALBUMIN 4.2 07/01/2022 0527   ALBUMIN 4.1 05/14/2022 1406    Studies/Results: No results found.  Armandina Gemma 07/03/2022   Patient ID: Bryan [name], male   DOB: 12/14/1971, 51 y.o.   MRN: [id number]

## 2022-07-04 LAB — GLUCOSE, CAPILLARY
Glucose-Capillary: 164 mg/dL — ABNORMAL HIGH (ref 70–99)
Glucose-Capillary: 207 mg/dL — ABNORMAL HIGH (ref 70–99)
Glucose-Capillary: 153 mg/dL — ABNORMAL HIGH (ref 70–99)
Glucose-Capillary: 155 mg/dL — ABNORMAL HIGH (ref 70–99)

## 2022-07-04 LAB — BASIC METABOLIC PANEL
Anion gap: 8 (ref 5–15)
BUN: 14 mg/dL (ref 6–20)
CO2: 23 mmol/L (ref 22–32)
Calcium: 8.6 mg/dL — ABNORMAL LOW (ref 8.9–10.3)
Chloride: 102 mmol/L (ref 98–111)
Creatinine, Ser: 1.12 mg/dL (ref 0.61–1.24)
GFR, Estimated: >60 mL/min (ref >60)
Glucose, Bld: 121 mg/dL — ABNORMAL HIGH (ref 70–99)
Potassium: 4.3 mmol/L (ref 3.5–5.1)
Sodium: 133 mmol/L — ABNORMAL LOW (ref 135–145)

## 2022-07-04 NOTE — Progress Notes (Signed)
Assessment & Plan: POD#3 - s/p Procedure(s): XI ROBOTIC ASSISTED LOWER ANTERIOR RESECTION STAPLED COLOANAL ANASTAMOSIS INTRAOP ASSESSMENT OF PERFUSION AND TAPP BLOCKS DIVERTING LOOP ILEOSTOMY FLEXIBLE SIGMOIDOSCOPY 07/01/2022 - Dr. Annye English   - pain control improved - continue Dilaudid PCA - WOCN following - BID Metamucil - nausea and emesis yesterday, improved this AM - begin clear liquid diet - Ambulate 5x/day        Armandina Gemma, MD [facility] A [place] practice Office: [phone]        Chief Complaint: Rectal carcinoma  Subjective: Patient in bed, comfortable, wife at bedside.  Nausea and emesis resolved.  Pain controlled with PCA.  Objective: Vital signs in last 24 hours: Temp:  [97.8 F (36.6 C)-99.3 F (37.4 C)] 97.8 F (36.6 C) (07/09 0931) Pulse Rate:  [74-94] 74 (07/09 0931) Resp:  [14-20] 18 (07/09 0931) BP: (116-139)/(76-90) 123/84 (07/09 0931) SpO2:  [92 %-100 %] 98 % (07/09 0931) Weight:  [81.7 kg] 81.7 kg (07/09 0500) Last BM Date : 07/04/22  Intake/Output from previous day: 07/08 0701 - 07/09 0700 In: 1961.4 [P.O.:180; I.V.:781.4; IV Piggyback:1000] Out: 2950 [Urine:500; Stool:2450] Intake/Output this shift: Total I/O In: 0.9 [I.V.:0.9] Out: 280 [Drains:5; Stool:275]  Physical Exam: HEENT - sclerae clear, mucous membranes moist Neck - soft Abdomen - soft, mild distension; active BS present; liquid succus in ostomy Ext - edema in bilat hands improved, non-tender Neuro - alert & oriented, no focal deficits  Lab Results:  Recent Labs    07/02/22 0508 07/03/22 0531  WBC 6.9 8.4  HGB 12.1* 13.7  HCT 36.7* 41.2  PLT 171 182   BMET Recent Labs    07/03/22 0531 07/04/22 0447  NA 133* 133*  K 4.8 4.3  CL 101 102  CO2 24 23  GLUCOSE 198* 121*  BUN 13 14  CREATININE 1.11 1.12  CALCIUM 8.9 8.6*   PT/INR No results for input(s): "LABPROT", "INR" in the last 72 hours. Comprehensive Metabolic Panel:     Component Value Date/Time   NA 133 (L) 07/04/2022 0447   NA 133 (L) 07/03/2022 0531   K 4.3 07/04/2022 0447   K 4.8 07/03/2022 0531   CL 102 07/04/2022 0447   CL 101 07/03/2022 0531   CO2 23 07/04/2022 0447   CO2 24 07/03/2022 0531   BUN 14 07/04/2022 0447   BUN 13 07/03/2022 0531   CREATININE 1.12 07/04/2022 0447   CREATININE 1.11 07/03/2022 0531   CREATININE 1.24 05/14/2022 1406   CREATININE 1.13 02/05/2022 1221   GLUCOSE 121 (H) 07/04/2022 0447   GLUCOSE 198 (H) 07/03/2022 0531   CALCIUM 8.6 (L) 07/04/2022 0447   CALCIUM 8.9 07/03/2022 0531   AST 25 07/01/2022 0527   AST 32 05/14/2022 1406   AST 24 04/12/2022 1250   AST 24 02/05/2022 1221   ALT 32 07/01/2022 0527   ALT 49 (H) 05/14/2022 1406   ALT 30 04/12/2022 1250   ALT 29 02/05/2022 1221   ALKPHOS 73 07/01/2022 0527   ALKPHOS 100 05/14/2022 1406   BILITOT 0.9 07/01/2022 0527   BILITOT 0.5 05/14/2022 1406   BILITOT 0.4 04/12/2022 1250   BILITOT 0.7 02/05/2022 1221   PROT 6.9 07/01/2022 0527   PROT 6.9 05/14/2022 1406   ALBUMIN 4.2 07/01/2022 0527   ALBUMIN 4.1 05/14/2022 1406    Studies/Results: No results found.    Armandina Gemma 07/04/2022   Patient ID: Bryan [name], male   DOB: Jun 27, 1971, 51 y.o.   MRN: [id number]

## 2022-07-05 ENCOUNTER — Other Ambulatory Visit (HOSPITAL_COMMUNITY): Payer: Self-pay

## 2022-07-05 LAB — GLUCOSE, CAPILLARY
Glucose-Capillary: 137 mg/dL — ABNORMAL HIGH (ref 70–99)
Glucose-Capillary: 162 mg/dL — ABNORMAL HIGH (ref 70–99)
Glucose-Capillary: 133 mg/dL — ABNORMAL HIGH (ref 70–99)
Glucose-Capillary: 138 mg/dL — ABNORMAL HIGH (ref 70–99)

## 2022-07-05 LAB — SURGICAL PATHOLOGY

## 2022-07-05 MED ORDER — OXYCODONE HCL 5 MG PO TABS
5.0000 mg | ORAL_TABLET | Freq: Four times a day (QID) | ORAL | Status: DC | PRN
  Administered 2022-07-05 (×2): 10 mg via ORAL
  Administered 2022-07-06: 5 mg via ORAL
  Administered 2022-07-06 – 2022-07-07 (×4): 10 mg via ORAL
  Filled 2022-07-05 (×4): qty 2
  Filled 2022-07-05: qty 1
  Filled 2022-07-05 (×2): qty 2

## 2022-07-05 MED ORDER — HYDROMORPHONE HCL 1 MG/ML IJ SOLN
0.5000 mg | INTRAMUSCULAR | Status: DC | PRN
  Administered 2022-07-05 – 2022-07-06 (×4): 0.5 mg via INTRAVENOUS
  Filled 2022-07-05 (×4): qty 0.5

## 2022-07-05 NOTE — Progress Notes (Signed)
  Subjective No acute events. Feeling well. No nausea/vomiting with clear liquids. Incisional soreness, not diffuse  Objective: Vital signs in last 24 hours: Temp:  [97.8 F (36.6 C)-100 F (37.8 C)] 98.5 F (36.9 C) (07/10 0510) Pulse Rate:  [74-107] 81 (07/10 0510) Resp:  [18-20] 18 (07/10 0510) BP: (111-132)/(75-84) 116/75 (07/10 0510) SpO2:  [93 %-100 %] 95 % (07/10 0510) Last BM Date : 07/04/22  Intake/Output from previous day: 07/09 0701 - 07/10 0700 In: 2690.9 [P.O.:845; I.V.:1845.9] Out: 4665 [Urine:2700; Drains:40; EYCXK:4818] Intake/Output this shift: No intake/output data recorded.  Gen: NAD, comfortable CV: RRR Pulm: Normal work of breathing Abd: Soft, minimal incisional tenderness, nondistended; ileostomy pink, productive of thin bilious fluid. JP drain thin serosanguinous Ext: SCDs in place  Lab Results: CBC  Recent Labs    07/03/22 0531  WBC 8.4  HGB 13.7  HCT 41.2  PLT 182   BMET Recent Labs    07/03/22 0531 07/04/22 0447  NA 133* 133*  K 4.8 4.3  CL 101 102  CO2 24 23  GLUCOSE 198* 121*  BUN 13 14  CREATININE 1.11 1.12  CALCIUM 8.9 8.6*   PT/INR No results for input(s): "LABPROT", "INR" in the last 72 hours. ABG No results for input(s): "PHART", "HCO3" in the last 72 hours.  Invalid input(s): "PCO2", "PO2"  Studies/Results:  Anti-infectives: Anti-infectives (From admission, onward)    Start     Dose/Rate Route Frequency Ordered Stop   07/01/22 1400  neomycin (MYCIFRADIN) tablet 1,000 mg  Status:  Discontinued       See Hyperspace for full Linked Orders Report.   1,000 mg Oral 3 times per day 07/01/22 0526 07/01/22 0539   07/01/22 1400  metroNIDAZOLE (FLAGYL) tablet 1,000 mg  Status:  Discontinued       See Hyperspace for full Linked Orders Report.   1,000 mg Oral 3 times per day 07/01/22 0526 07/01/22 0539   07/01/22 0600  cefoTEtan (CEFOTAN) 2 g in sodium chloride 0.9 % 100 mL IVPB        2 g 200 mL/hr over 30 Minutes  Intravenous On call to O.R. 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 He would like to see diabetic coordinator, will place consult -Ambulate 5x/day -MIVF until reliably tolerating diet and output <1.2L/day -PPx: SQH, SCDs   LOS: 4 days   Nadeen Landau, MD [facility], [place]

## 2022-07-05 NOTE — TOC Progression Note (Signed)
Transition of Care [facility]) - Progression Note    Patient Details  Name: Bryan [name] MRN: [id number] Date of Birth: October 10, 1971  [facility] CM/SW Contact  Lennart Pall, LCSW Phone Number: 07/05/2022, 3:05 PM  Clinical Narrative:     Orders placed for [facility] coverage and have spoken with pt about this.  He is agreeable to services if TOC can secure coverage but, also, says he is feeling comfortable managing ostomy and drain on his own if needed.  Expected Discharge Plan: Home/Self Care Barriers to Discharge: Continued Medical Work up  Expected Discharge Plan and Services Expected Discharge Plan: Home/Self Care   Discharge Planning Services: CM Consult   Living arrangements for the past 2 months: Single Family Home                                       Social Determinants of Health (SDOH) Interventions    Readmission Risk Interventions    07/05/2022    3:05 PM  Readmission Risk Prevention Plan  Post Dischage Appt Complete  Medication Screening Complete  Transportation Screening Complete

## 2022-07-05 NOTE — Inpatient Diabetes Management (Signed)
Inpatient Diabetes Program Recommendations  AACE/ADA: New Consensus Statement on Inpatient Glycemic Control (2015)  Target Ranges:  Prepandial:   less than 140 mg/dL      Peak postprandial:   less than 180 mg/dL (1-2 hours)      Critically ill patients:  140 - 180 mg/dL   Lab Results  Component Value Date   GLUCAP 162 (H) 07/05/2022   HGBA1C 6.3 (H) 07/01/2022    Review of Glycemic Control  Diabetes history: None Current orders for Inpatient glycemic control:  Novolog 0-9 units tid + hs  Spoke with pt regarding A1c level of 6.3%. Discussed lifestyle modifications with dietary changes and exercise. Answered questions pt had. Pt knows to have close follow up with PCP.   Thanks,  Tama Headings RN, MSN, BC-ADM Inpatient Diabetes Coordinator Team Pager [phone] (8a-5p)

## 2022-07-05 NOTE — Consult Note (Signed)
[place] Nurse ostomy follow up Stoma type/location: RLQ loop ileostomy Stomal assessment/size: red, budded, moist/ productive OS at 12 and non-productive OS at 6/ 1 1/2 inch Peristomal assessment: intact Treatment options for stomal/peristomal skin: barrier ring Output: 150 cc Ostomy pouching: 2pc.  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 Stressed importance of hydration, recommended 8oz of fluid with each pouch empty.  Discussed food blockage, chewing food, limiting foods that are difficult to breakdown  Discussed risk of peristomal hernia; limit lifting post op. Wait for release from surgery  Discussed eating habits in details.  Discussed intimacy with patient and use of belts/Osteosecrets etc.      Enrolled patient in [place] Start Discharge program: Yes  [place] Nurse will follow along with you for continued support with ostomy teaching and care [place] MSN, [place], [place], [place], [place]

## 2022-07-06 ENCOUNTER — Other Ambulatory Visit (HOSPITAL_COMMUNITY): Payer: Self-pay

## 2022-07-06 LAB — GLUCOSE, CAPILLARY
Glucose-Capillary: 151 mg/dL — ABNORMAL HIGH (ref 70–99)
Glucose-Capillary: 125 mg/dL — ABNORMAL HIGH (ref 70–99)
Glucose-Capillary: 130 mg/dL — ABNORMAL HIGH (ref 70–99)
Glucose-Capillary: 113 mg/dL — ABNORMAL HIGH (ref 70–99)

## 2022-07-06 MED ORDER — LOPERAMIDE HCL 2 MG PO CAPS
2.0000 mg | ORAL_CAPSULE | Freq: Three times a day (TID) | ORAL | Status: DC
Start: 2022-07-06 — End: 2022-07-07
  Administered 2022-07-06 – 2022-07-07 (×4): 2 mg via ORAL
  Filled 2022-07-06 (×4): qty 1

## 2022-07-06 NOTE — Progress Notes (Signed)
  Subjective No acute events. Feeling well. 1 bout of nausea yesterday after dinner, none since. Denies emesis. Ostomy working well. Incisional soreness, not diffuse - reports pain is a 2-3 currently.  Objective: Vital signs in last 24 hours: Temp:  [97.8 F (36.6 C)-99.9 F (37.7 C)] 98.2 F (36.8 C) (07/11 1151) Pulse Rate:  [79-93] 85 (07/11 1151) Resp:  [18] 18 (07/11 1151) BP: (119-141)/(83-95) 127/85 (07/11 1151) SpO2:  [98 %-99 %] 99 % (07/11 1151) Weight:  [83 kg] 83 kg (07/11 0500) Last BM Date : 07/06/22  Intake/Output from previous day: 07/10 0701 - 07/11 0700 In: 2420.9 [P.O.:1200; I.V.:1220.9] Out: 4325 [Urine:2400; Drains:225; NFAOZ:3086] Intake/Output this shift: Total I/O In: 414.2 [P.O.:120; I.V.:294.2] Out: 600 [Urine:450; Stool:150]  Gen: NAD, comfortable CV: RRR Pulm: Normal work of breathing Abd: Soft, minimal incisional tenderness, nondistended; ileostomy pink, productive of thin bilious fluid. JP drain thin serosanguinous Ext: SCDs in place  Lab Results: CBC  No results for input(s): "WBC", "HGB", "HCT", "PLT" in the last 72 hours.  BMET Recent Labs    07/04/22 0447  NA 133*  K 4.3  CL 102  CO2 23  GLUCOSE 121*  BUN 14  CREATININE 1.12  CALCIUM 8.6*   PT/INR No results for input(s): "LABPROT", "INR" in the last 72 hours. ABG No results for input(s): "PHART", "HCO3" in the last 72 hours.  Invalid input(s): "PCO2", "PO2"  Studies/Results:  Anti-infectives: Anti-infectives (From admission, onward)    Start     Dose/Rate Route Frequency Ordered Stop   07/01/22 1400  neomycin (MYCIFRADIN) tablet 1,000 mg  Status:  Discontinued       See Hyperspace for full Linked Orders Report.   1,000 mg Oral 3 times per day 07/01/22 0526 07/01/22 0539   07/01/22 1400  metroNIDAZOLE (FLAGYL) tablet 1,000 mg  Status:  Discontinued       See Hyperspace for full Linked Orders Report.   1,000 mg Oral 3 times per day 07/01/22 0526 07/01/22 0539    07/01/22 0600  cefoTEtan (CEFOTAN) 2 g in sodium chloride 0.9 % 100 mL IVPB        2 g 200 mL/hr over 30 Minutes Intravenous On call to O.R. 07/01/22 0526 07/01/22 0741        Assessment/Plan: Patient Active Problem List   Diagnosis Date Noted   S/P laparoscopic-assisted sigmoidectomy 07/01/2022   Rectal cancer ([place]) 08/18/2021   Routine general medical examination at a health care facility 10/19/2011   Lipoma of abdominal wall 10/19/2011   Tendonitis of elbow, right 10/19/2011   Allergic rhinitis, cause unspecified    s/p Procedure(s): XI ROBOTIC ASSISTED LOWER ANTERIOR RESECTION STAPLED COLOANAL ANASTAMOSIS INTRAOP ASSESSMENT OF PERFUSION AND TAPP BLOCKS DIVERTING LOOP ILEOSTOMY FLEXIBLE SIGMOIDOSCOPY 07/01/2022  -Doing  reasonably well -WOCN to see again today -Home health consult -BID Metamucil -Carb modified as tolerated -Pre-diabetes - continue PRN SSI. 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 All margins negative. Distal donut also negative.  Reviewed pathology with the patient today. Case submitted for re-discussion at multidisciplinary tumor board  Dispo: Possible discharge as soon as tomorrow if tolerating diet, ostomy output controlled, doing well, comfortable with stoma care   LOS: 5 days   Nadeen Landau, MD [facility], [place]

## 2022-07-06 NOTE — Consult Note (Signed)
[place] Nurse ostomy consult note Stoma type/location: RLQ, loop ileostomy  Output liquid stool  Ostomy pouching: 2pc. 2 1/4" with skin barrier ring  Education provided: met with patient today, provided educational materials for travel/work, dietary guidelines for ileostomy, Engineer, agricultural, ostomy clinic information Patient with no questions, we will change pouch in the am if he desires.  Enrolled patient in [place] program: Yes  [place], [place], [place]

## 2022-07-07 ENCOUNTER — Other Ambulatory Visit (HOSPITAL_COMMUNITY): Payer: Self-pay

## 2022-07-07 ENCOUNTER — Other Ambulatory Visit: Payer: Self-pay

## 2022-07-07 LAB — GLUCOSE, CAPILLARY
Glucose-Capillary: 121 mg/dL — ABNORMAL HIGH (ref 70–99)
Glucose-Capillary: 111 mg/dL — ABNORMAL HIGH (ref 70–99)

## 2022-07-07 MED ORDER — LOPERAMIDE HCL 2 MG PO CAPS: 2.0000 mg | ORAL_CAPSULE | Freq: Two times a day (BID) | ORAL | 1 refills | Status: AC

## 2022-07-07 NOTE — Progress Notes (Signed)
The proposed treatment discussed in conference is for discussion purpose only and is not a binding recommendation.  The patients have not been physically examined, or presented with their treatment options.  Therefore, final treatment plans cannot be decided.  

## 2022-07-07 NOTE — TOC Transition Note (Signed)
Transition of Care [facility]) - CM/SW Discharge Note   Patient Details  Name: Marky Buresh MRN: [id number] Date of Birth: [birth year]  Transition of [facility]) CM/SW Contact:  Lennart Pall, LCSW Phone Number: 07/07/2022, 10:17 AM   Clinical Narrative:    Able to secure [facility] coverage with [facility] and pt aware and agreeable.  No further TOC needs.   Final next level of care: [place] Barriers to Discharge: Barriers Resolved   Patient Goals and CMS Choice Patient states their goals for this hospitalization and ongoing recovery are:: to go home CMS Medicare.gov Compare Post Acute Care list provided to:: Patient    Discharge Placement                       Discharge Plan and Services   Discharge Planning Services: CM Consult            DME Arranged: N/A DME Agency: NA       HH Arranged: RN [place] Agency: [place] Date [facility] Agency Contacted: 07/07/22 Time [place]: [phone] Representative spoke with at [place]: [place] Determinants of Health ([place]) Interventions     Readmission Risk Interventions    07/05/2022    3:05 PM  Readmission Risk Prevention Plan  Post Dischage Appt Complete  Medication Screening Complete  Transportation Screening Complete

## 2022-07-07 NOTE — Consult Note (Signed)
[place] Nurse ostomy follow up Stoma type/location: loop ileostomy/ RLQ Stomal assessment/size: red, moist, budded/ 1 1/2 inch Peristomal assessment: intact Treatment options for stomal/peristomal skin: barrier ring applied  Output: 25 cc Ostomy pouching: 2pc. 2 1/4 inch skin barrier and pouch with barrier ring Education provided: Explained stoma characteristics (budded, color, care) Pt and wife both participated in removing old pouch and skin barrier, cutting new skin barrier, cleaning peristomal skin and stoma, applying barrier ring, skin barrier and pouch. Discussed gas, diarrhea, dehydration  Patient previously provided with [facility] and marked items currently using   Enrolled patient in [employer] Discharge program: Yes, previously enrolled  [place] RN, [place] student [place], [place], [place]

## 2022-07-07 NOTE — Plan of Care (Signed)
Problem: Education: Goal: Understanding of discharge needs will improve Outcome: Adequate for Discharge Goal: Verbalization of understanding of the causes of altered bowel function will improve Outcome: Adequate for Discharge   Problem: Activity: Goal: Ability to tolerate increased activity will improve Outcome: Adequate for Discharge   Problem: Bowel/Gastric: Goal: Gastrointestinal status for postoperative course will improve Outcome: Adequate for Discharge   Problem: Health Behavior/Discharge Planning: Goal: Identification of community resources to assist with postoperative recovery needs will improve Outcome: Adequate for Discharge   Problem: Nutritional: Goal: Will attain and maintain optimal nutritional status will improve Outcome: Adequate for Discharge   Problem: Clinical Measurements: Goal: Postoperative complications will be avoided or minimized Outcome: Adequate for Discharge   Problem: Respiratory: Goal: Respiratory status will improve Outcome: Adequate for Discharge   Problem: Skin Integrity: Goal: Will show signs of wound healing Outcome: Adequate for Discharge   Problem: Education: Goal: Knowledge of General Education information will improve Description: Including pain rating scale, medication(s)/side effects and non-pharmacologic comfort measures Outcome: Adequate for Discharge   Problem: Health Behavior/Discharge Planning: Goal: Ability to manage health-related needs will improve Outcome: Adequate for Discharge   Problem: Clinical Measurements: Goal: Ability to maintain clinical measurements within normal limits will improve Outcome: Adequate for Discharge Goal: Will remain free from infection Outcome: Adequate for Discharge Goal: Diagnostic test results will improve Outcome: Adequate for Discharge Goal: Respiratory complications will improve Outcome: Adequate for Discharge Goal: Cardiovascular complication will be avoided Outcome: Adequate for  Discharge   Problem: Activity: Goal: Risk for activity intolerance will decrease Outcome: Adequate for Discharge   Problem: Nutrition: Goal: Adequate nutrition will be maintained Outcome: Adequate for Discharge   Problem: Coping: Goal: Level of anxiety will decrease Outcome: Adequate for Discharge   Problem: Elimination: Goal: Will not experience complications related to bowel motility Outcome: Adequate for Discharge Goal: Will not experience complications related to urinary retention Outcome: Adequate for Discharge   Problem: Pain Managment: Goal: General experience of comfort will improve Outcome: Adequate for Discharge   Problem: Safety: Goal: Ability to remain free from injury will improve Outcome: Adequate for Discharge   Problem: Skin Integrity: Goal: Risk for impaired skin integrity will decrease Outcome: Adequate for Discharge   Problem: Education: Goal: Required Educational Video(s) Outcome: Adequate for Discharge   Problem: Clinical Measurements: Goal: Ability to maintain clinical measurements within normal limits will improve Outcome: Adequate for Discharge Goal: Postoperative complications will be avoided or minimized Outcome: Adequate for Discharge   Problem: Skin Integrity: Goal: Demonstration of wound healing without infection will improve Outcome: Adequate for Discharge   Problem: Education: Goal: Ability to describe self-care measures that may prevent or decrease complications (Diabetes Survival Skills Education) will improve Outcome: Adequate for Discharge Goal: Individualized Educational Video(s) Outcome: Adequate for Discharge   Problem: Coping: Goal: Ability to adjust to condition or change in health will improve Outcome: Adequate for Discharge   Problem: Fluid Volume: Goal: Ability to maintain a balanced intake and output will improve Outcome: Adequate for Discharge   Problem: Health Behavior/Discharge Planning: Goal: Ability to  identify and utilize available resources and services will improve Outcome: Adequate for Discharge Goal: Ability to manage health-related needs will improve Outcome: Adequate for Discharge   Problem: Metabolic: Goal: Ability to maintain appropriate glucose levels will improve Outcome: Adequate for Discharge   Problem: Nutritional: Goal: Maintenance of adequate nutrition will improve Outcome: Adequate for Discharge Goal: Progress toward achieving an optimal weight will improve Outcome: Adequate for Discharge   Problem: Skin Integrity: Goal: Risk  for impaired skin integrity will decrease Outcome: Adequate for Discharge   Problem: Tissue Perfusion: Goal: Adequacy of tissue perfusion will improve Outcome: Adequate for Discharge

## 2022-07-07 NOTE — Progress Notes (Signed)
  Subjective No acute events. Feeling well. No nausea/vomiting. Ileostomy output much improved on Imodium. No abdominal pain, doing well on oral analgesics.   Objective: Vital signs in last 24 hours: Temp:  [98.1 F (36.7 C)-98.3 F (36.8 C)] 98.3 F (36.8 C) (07/12 0504) Pulse Rate:  [73-85] 73 (07/12 0504) Resp:  [18] 18 (07/12 0504) BP: (118-127)/(73-85) 123/73 (07/12 0504) SpO2:  [98 %-99 %] 98 % (07/12 0504) Last BM Date : 07/07/22  Intake/Output from previous day: 07/11 0701 - 07/12 0700 In: 1583.9 [P.O.:300; I.V.:1283.9] Out: 2790 [Urine:2350; Drains:15; Stool:425] Intake/Output this shift: Total I/O In: -  Out: 640 [Urine:400; Drains:90; Stool:150]  Gen: NAD, comfortable CV: RRR Pulm: Normal work of breathing Abd: Soft, minimal incisional tenderness, nondistended; ileostomy pink, productive of thin bilious fluid. JP drain tan, minimally turbid Ext: SCDs in place  Lab Results: CBC  No results for input(s): "WBC", "HGB", "HCT", "PLT" in the last 72 hours.  BMET No results for input(s): "NA", "K", "CL", "CO2", "GLUCOSE", "BUN", "CREATININE", "CALCIUM" in the last 72 hours.  PT/INR No results for input(s): "LABPROT", "INR" in the last 72 hours. ABG No results for input(s): "PHART", "HCO3" in the last 72 hours.  Invalid input(s): "PCO2", "PO2"  Studies/Results:  Anti-infectives: Anti-infectives (From admission, onward)    Start     Dose/Rate Route Frequency Ordered Stop   07/01/22 1400  neomycin (MYCIFRADIN) tablet 1,000 mg  Status:  Discontinued       See Hyperspace for full Linked Orders Report.   1,000 mg Oral 3 times per day 07/01/22 0526 07/01/22 0539   07/01/22 1400  metroNIDAZOLE (FLAGYL) tablet 1,000 mg  Status:  Discontinued       See Hyperspace for full Linked Orders Report.   1,000 mg Oral 3 times per day 07/01/22 0526 07/01/22 0539   07/01/22 0600  cefoTEtan (CEFOTAN) 2 g in sodium chloride 0.9 % 100 mL IVPB        2 g 200 mL/hr over 30 Minutes  Intravenous On call to O.R. 07/01/22 0526 07/01/22 0741        Assessment/Plan: Patient Active Problem List   Diagnosis Date Noted   S/P laparoscopic-assisted sigmoidectomy 07/01/2022   Rectal cancer ([place]) 08/18/2021   Routine general medical examination at a health care facility 10/19/2011   Lipoma of abdominal wall 10/19/2011   Tendonitis of elbow, right 10/19/2011   Allergic rhinitis, cause unspecified    s/p Procedure(s): XI ROBOTIC ASSISTED LOWER ANTERIOR RESECTION STAPLED COLOANAL ANASTAMOSIS INTRAOP ASSESSMENT OF PERFUSION AND TAPP BLOCKS DIVERTING LOOP ILEOSTOMY FLEXIBLE SIGMOIDOSCOPY 07/01/2022  -Doing  reasonably well -WOCN to see again today -Home health consult -BID Metamucil -Carb modified as tolerated -Pre-diabetes - continue PRN SSI. Saw diabetic coordinator TZ:GYFV and lifestyle recommendations, PCP follow-up -Ambulate 5x/day -MIVF until reliably tolerating diet and output <1.2L/day -Imodium BID -Wife reports he didn't eat as much yesterday... therefore would like to see he is tolerating adequate oral intake prior to discharge. Reviewed plans with him and his wife as well as nursing team. 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 All margins negative. Distal donut also negative.  Reviewed pathology with the patient today. MDT tumor board has recommended ongoing surveillance, no further treatment recommended at present   LOS: 6 days   Nadeen Landau, MD [facility], [place]

## 2022-07-07 NOTE — Progress Notes (Signed)
Pt was discharged home today. Instructions were reviewed with patient, and questions were answered. Patient demonstrated correct way to empty ileostomy and JP drain. Medications were delivered to room via pharmacy.Pt was taken to main entrance via wheelchair by NT.

## 2022-07-08 ENCOUNTER — Ambulatory Visit: Payer: Self-pay

## 2022-07-08 NOTE — Patient Outreach (Signed)
  Care Coordination [facility] Note Transition Care Management Unsuccessful Follow-up Telephone Call  Date of discharge and from where:  07/07/22 [name]  Attempts:  1st Attempt  Reason for unsuccessful TCM follow-up call:  Left voice message  Daneen Schick, Arita Miss, CDP Social Worker, Certified Dementia Practitioner [place] Management [phone]

## 2022-07-09 ENCOUNTER — Other Ambulatory Visit: Payer: Self-pay | Admitting: *Deleted

## 2022-07-09 NOTE — Discharge Summary (Signed)
Patient ID: [name] MRN: [id number] DOB/AGE: 1971/11/22 51 y.o.  Admit date: 07/01/2022 Discharge date: 07/07/2022  Discharge Diagnoses Patient Active Problem List   Diagnosis Date Noted   S/P laparoscopic-assisted sigmoidectomy 07/01/2022   Rectal cancer ([place]) 08/18/2021   Routine general medical examination at a health care facility 10/19/2011   Lipoma of abdominal wall 10/19/2011   Tendonitis of elbow, right 10/19/2011   Allergic rhinitis, cause unspecified     Consultants none  Procedures OR 07/01/22 -  Robotic-assisted ultra-low anterior resection with double stapled coloanal anastomosis Diagnostic flexible sigmoidoscopy (necessary to localize lesion) Intraoperative assessment of perfusion using ICG fluorescence imaging Bilateral transversus abdominus plane (TAP) blocks     Hospital Course: Admitted postoperatively where he recovered reasonably well. He did have postoperative ileus that resolved around 07/05/22. Ileostomy output became >1.2L/24hr so was continued on metamucil but Imodium was added to regimen - initially '2mg'$  TID but decreased to '2mg'$  BID on day of discharge. On 07/07/22, he is comfortable managing his ostomy, knows what to expect, completed teaching sessions and understands to continue imodium. JP drain remained with >50 cc of output per day so was left in place at discharge with plans for drain check in the office next week and possible removal. He is comfortable with and stable for discharge home. Follow-ups arranged, referral to outpatient ambulatory ostomy clinic in place as well    Allergies as of 07/07/2022   No Known Allergies      Medication List     STOP taking these medications    capecitabine 500 MG tablet Commonly known as: XELODA   hydrocortisone 2.5 % cream   ondansetron 8 MG tablet Commonly known as: Zofran   pantoprazole 20 MG tablet Commonly known as: Protonix   prochlorperazine 10 MG tablet Commonly known as: COMPAZINE        TAKE these medications    B-12 PO Take 1 tablet by mouth daily.   ibuprofen 800 MG tablet Commonly known as: ADVIL Take 1 tablet (800 mg total) by mouth 2 (two) times daily as needed for moderate pain. Take with food. Follow up with PCP for refills   loperamide 2 MG capsule Commonly known as: IMODIUM Take 1 capsule (2 mg total) by mouth 2 (two) times daily.   traMADol 50 MG tablet Commonly known as: Ultram Take 1 tablet by mouth every 6 hours as needed for up to 5 days (postop pain not controlled with tylenol/ibuprofen first).          Follow-up Information     Ileana Roup, MD Follow up in 2 week(s).   Specialties: General Surgery, Colon and Rectal Surgery Contact information: [place] [address] [place], [facility] Follow up.   Specialty: Home Health Services Why: to provide home nursing visits Contact information: [place] [address] [phone]                 [name], M.D. [facility]

## 2022-07-09 NOTE — Patient Outreach (Signed)
  Care Coordination [facility] Note Transition Care Management Follow-up Telephone Call Date of discharge and from where: 07/07/22 [name]  How have you been since you were released from the hospital? "So far everything is going well considering the surgery I had". Any questions or concerns? No  Items Reviewed: Did the pt receive and understand the discharge instructions provided? Yes  Medications obtained and verified? Yes  Other? No  Any new allergies since your discharge? No  Dietary orders reviewed? Yes Do you have support at home? Yes   Home Care and Equipment/Supplies: Were home health services ordered? yes If so, what is the name of the agency? [facility]  Has the agency set up a time to come to the patient's home? No, nurse provided wife with Alvis Lemmings number to contact Were any new equipment or medical supplies ordered?  Yes: ileostomy supplies  What is the name of the medical supply agency? [facility] Were you able to get the supplies/equipment? yes Do you have any questions related to the use of the equipment or supplies? No  Functional Questionnaire: (I = Independent and D = Dependent) ADLs: I  Bathing/Dressing- assist with wife  Meal Prep- wife assist  Eating- I  Maintaining continence- I  Transferring/Ambulation- I  Managing Meds- I  Follow up appointments reviewed:  PCP Hospital f/u appt confirmed? No   Specialist Hospital f/u appt confirmed? Yes  Scheduled to Dr. Jacklynn Bue on 07/23/22 @ 1520. Are transportation arrangements needed? No  If their condition worsens, is the pt aware to call PCP or go to the Emergency Dept.? Yes Was the patient provided with contact information for the PCP's office or ED? Yes Was to pt encouraged to call back with questions or concerns? Yes  SDOH assessments and interventions completed:   Yes  Care Coordination Interventions Activated:  No Care Coordination Interventions:   N/A  Encounter Outcome:  Pt. Visit Completed  Emelia Loron RN,  BSN [place] [phone] [email or web address]

## 2022-07-12 ENCOUNTER — Telehealth: Payer: Self-pay | Admitting: Hematology

## 2022-07-12 NOTE — Telephone Encounter (Signed)
Per 7/17 phone line pt wife called to cancel appointment.  Did not wish to r/s  appointment canceled per pt request.

## 2022-07-15 ENCOUNTER — Other Ambulatory Visit: Payer: Self-pay

## 2022-07-15 ENCOUNTER — Inpatient Hospital Stay (HOSPITAL_COMMUNITY)
Admission: EM | Admit: 2022-07-15 | Discharge: 2022-07-19 | DRG: 862 | Disposition: A | Discharge status: Home / Self Care | Payer: BC Managed Care – PPO | Attending: Surgery | Admitting: Surgery

## 2022-07-15 DIAGNOSIS — Z8042 Family history of malignant neoplasm of prostate: Secondary | ICD-10-CM

## 2022-07-15 DIAGNOSIS — Z8249 Family history of ischemic heart disease and other diseases of the circulatory system: Secondary | ICD-10-CM

## 2022-07-15 DIAGNOSIS — K651 Peritoneal abscess: Secondary | ICD-10-CM | POA: Diagnosis not present

## 2022-07-15 DIAGNOSIS — T8143XA Infection following a procedure, organ and space surgical site, initial encounter: Secondary | ICD-10-CM | POA: Diagnosis not present

## 2022-07-15 DIAGNOSIS — K6289 Other specified diseases of anus and rectum: Secondary | ICD-10-CM | POA: Diagnosis present

## 2022-07-15 DIAGNOSIS — Z9221 Personal history of antineoplastic chemotherapy: Secondary | ICD-10-CM

## 2022-07-15 DIAGNOSIS — Z923 Personal history of irradiation: Secondary | ICD-10-CM

## 2022-07-15 DIAGNOSIS — Z811 Family history of alcohol abuse and dependence: Secondary | ICD-10-CM

## 2022-07-15 DIAGNOSIS — Z9049 Acquired absence of other specified parts of digestive tract: Secondary | ICD-10-CM

## 2022-07-15 DIAGNOSIS — Z85048 Personal history of other malignant neoplasm of rectum, rectosigmoid junction, and anus: Secondary | ICD-10-CM

## 2022-07-15 DIAGNOSIS — C20 Malignant neoplasm of rectum: Secondary | ICD-10-CM | POA: Diagnosis present

## 2022-07-15 DIAGNOSIS — K611 Rectal abscess: Secondary | ICD-10-CM | POA: Diagnosis present

## 2022-07-15 DIAGNOSIS — E7800 Pure hypercholesterolemia, unspecified: Secondary | ICD-10-CM | POA: Diagnosis present

## 2022-07-15 DIAGNOSIS — Z803 Family history of malignant neoplasm of breast: Secondary | ICD-10-CM

## 2022-07-15 DIAGNOSIS — Z833 Family history of diabetes mellitus: Secondary | ICD-10-CM

## 2022-07-15 LAB — COMPREHENSIVE METABOLIC PANEL
ALT: 43 U/L (ref 0–44)
AST: 17 U/L (ref 15–41)
Albumin: 2.8 g/dL — ABNORMAL LOW (ref 3.5–5.0)
Alkaline Phosphatase: 97 U/L (ref 38–126)
Anion gap: 8 (ref 5–15)
BUN: 15 mg/dL (ref 6–20)
CO2: 24 mmol/L (ref 22–32)
Calcium: 8.7 mg/dL — ABNORMAL LOW (ref 8.9–10.3)
Chloride: 104 mmol/L (ref 98–111)
Creatinine, Ser: 1.12 mg/dL (ref 0.61–1.24)
GFR, Estimated: >60 mL/min (ref >60)
Glucose, Bld: 119 mg/dL — ABNORMAL HIGH (ref 70–99)
Potassium: 4.5 mmol/L (ref 3.5–5.1)
Sodium: 136 mmol/L (ref 135–145)
Total Bilirubin: 0.5 mg/dL (ref 0.3–1.2)
Total Protein: 6.4 g/dL — ABNORMAL LOW (ref 6.5–8.1)

## 2022-07-15 LAB — CBC WITH DIFFERENTIAL/PLATELET
Abs Immature Granulocytes: 0.04 10*3/uL (ref 0.00–0.07)
Basophils Absolute: 0.1 10*3/uL (ref 0.0–0.1)
Basophils Relative: 1 %
Eosinophils Absolute: 0.1 10*3/uL (ref 0.0–0.5)
Eosinophils Relative: 1 %
HCT: 34.4 % — ABNORMAL LOW (ref 39.0–52.0)
Hemoglobin: 11.5 g/dL — ABNORMAL LOW (ref 13.0–17.0)
Immature Granulocytes: 1 %
Lymphocytes Relative: 5 %
Lymphs Abs: 0.4 10*3/uL — ABNORMAL LOW (ref 0.7–4.0)
MCH: 27.8 pg (ref 26.0–34.0)
MCHC: 33.4 g/dL (ref 30.0–36.0)
MCV: 83.3 fL (ref 80.0–100.0)
Monocytes Absolute: 0.8 10*3/uL (ref 0.1–1.0)
Monocytes Relative: 9 %
Neutro Abs: 7.4 10*3/uL (ref 1.7–7.7)
Neutrophils Relative %: 83 %
Platelets: 453 10*3/uL — ABNORMAL HIGH (ref 150–400)
RBC: 4.13 MIL/uL — ABNORMAL LOW (ref 4.22–5.81)
RDW: 13.2 % (ref 11.5–15.5)
WBC: 8.8 10*3/uL (ref 4.0–10.5)
nRBC: 0 % (ref 0.0–0.2)

## 2022-07-15 LAB — LIPASE, BLOOD: Lipase: 21 U/L (ref 11–51)

## 2022-07-15 MED ORDER — HYDROMORPHONE HCL 1 MG/ML IJ SOLN
1.0000 mg | Freq: Once | INTRAMUSCULAR | Status: AC
  Administered 2022-07-16: 1 mg via INTRAVENOUS
  Filled 2022-07-15: qty 1

## 2022-07-15 MED ORDER — SODIUM CHLORIDE 0.9 % IV BOLUS (SEPSIS)
1000.0000 mL | Freq: Once | INTRAVENOUS | Status: AC
  Administered 2022-07-16: 1000 mL via INTRAVENOUS

## 2022-07-15 MED ORDER — SODIUM CHLORIDE 0.9 % IV SOLN
1000.0000 mL | INTRAVENOUS | Status: DC
  Administered 2022-07-16 – 2022-07-17 (×2): 1000 mL via INTRAVENOUS

## 2022-07-15 NOTE — ED Provider Triage Note (Signed)
Emergency Medicine Provider Triage Evaluation Note  Keiron Iodice , a 51 y.o. male  was evaluated in triage.  Pt complains of increasing abdominal pain since his JP drain was removed yesterday.  Had surgery on 7/6 for "mass removal "for his GI tract.  JP drain was placed at that time.  Has had issues with managing pain and JP drain malfunctions intermittently.  Initially did not have pain after it was removed, developed about an hour later.  Has continued to increase in his excruciating per patient.  Denies fever, shortness of breath, N/V.  But does endorse chills and pinkish-green fecal matter leaving his rectum.  No urinary symptoms.  Review of Systems  Positive:  Negative: See above  Physical Exam  BP 124/65 (BP Location: Left Arm)   Pulse 76   Temp 99.8 F (37.7 C) (Oral)   Resp 17   SpO2 97%  Gen:   Awake, no distress   Resp:  Normal effort  MSK:   Moves extremities without difficulty  Other:  Stoma with bag attached.  Greenish matter present in the bag.  Without tenderness to stoma.  Abdomen soft, TTP.  Medical Decision Making  Medically screening exam initiated at 9:51 PM.  Appropriate orders placed.  Saket Hellstrom was informed that the remainder of the evaluation will be completed by another provider, this initial triage assessment does not replace that evaluation, and the importance of remaining in the ED until their evaluation is complete.     Prince [place], [place] 09/18/29 [id number]

## 2022-07-15 NOTE — ED Provider Notes (Signed)
[facility] DEPT Provider Note  CSN: [id number] Arrival date & time: 07/15/22 2050  Chief Complaint(s) Post-op Problem  HPI Bryan [name] is a 51 y.o. male with a past medical history listed below including rectal cancer status post chemo/radiation and recent sigmoidectomy and rectal mass resection who had his JP drain pulled yesterday at follow-up visit.  Patient has been dealing with discomfort since the surgery however last night after having the drain pulled his rectal pain significantly worsened.  Patient is noting pinkish/[place] liquid now draining from his rectum.  He denies any fevers.  He endorses intermittent abdominal discomfort but has no abdominal pain currently.  No nausea or vomiting.  No other physical complaints  The history is provided by the patient and the spouse.    Past Medical History Past Medical History:  Diagnosis Date   Allergic rhinitis, cause unspecified    spring only   Hypercholesteremia    Rectal cancer ([place]) 09/14/2021   Patient Active Problem List   Diagnosis Date Noted   Abdominopelvic abscess ([place]) 07/16/2022   Intra-abdominal abscess ([place]) 07/16/2022   S/P laparoscopic-assisted sigmoidectomy 07/01/2022   Rectal cancer ([place]) 08/18/2021   Routine general medical examination at a health care facility 10/19/2011   Lipoma of abdominal wall 10/19/2011   Tendonitis of elbow, right 10/19/2011   Allergic rhinitis, cause unspecified    Home Medication(s) Prior to Admission medications   Medication Sig Start Date End Date Taking? Authorizing Provider  acetaminophen (TYLENOL) 500 MG tablet Take 1,000 mg by mouth every 6 (six) hours as needed for moderate pain.   Yes [provider]  Cyanocobalamin (B-12 PO) Take 1 tablet by mouth daily.   Yes [provider]  ibuprofen (ADVIL) 800 MG tablet Take 1 tablet (800 mg total) by mouth 2 (two) times daily as needed for moderate pain. Take with food. Follow up with PCP  for refills 02/05/22  Yes Truitt Merle, MD  oxyCODONE (OXY IR/ROXICODONE) 5 MG immediate release tablet Take 5 mg by mouth every 4 (four) hours as needed for severe pain. 07/13/22  Yes [provider]  loperamide (IMODIUM) 2 MG capsule Take 1 capsule (2 mg total) by mouth 2 (two) times daily. Patient taking differently: Take 2 mg by mouth as needed for diarrhea or loose stools. 07/07/22 09/05/22  Ileana Roup, MD                                                                                                                                    Allergies Patient has no known allergies.  Review of Systems Review of Systems As noted in HPI  Physical Exam Vital Signs  I have reviewed the triage vital signs BP 91/71   Pulse 87   Temp 99.8 F (37.7 C) (Oral)   Resp 16   SpO2 100%   Physical Exam Vitals reviewed.  Constitutional:      General: He  is not in acute distress.    Appearance: He is well-developed. He is not diaphoretic.  HENT:     Head: Normocephalic and atraumatic.     Right Ear: External ear normal.     Left Ear: External ear normal.     Nose: Nose normal.     Mouth/Throat:     Mouth: Mucous membranes are moist.  Eyes:     General: No scleral icterus.    Conjunctiva/sclera: Conjunctivae normal.  Neck:     Trachea: Phonation normal.  Cardiovascular:     Rate and Rhythm: Normal rate and regular rhythm.  Pulmonary:     Effort: Pulmonary effort is normal. No respiratory distress.     Breath sounds: No stridor.  Abdominal:     General: There is no distension.     Tenderness: There is no abdominal tenderness.    Genitourinary:    Comments: No erythema. No discharge or drainage noted. Musculoskeletal:        General: Normal range of motion.     Cervical back: Normal range of motion.  Neurological:     Mental Status: He is alert and oriented to person, place, and time.  Psychiatric:        Behavior: Behavior normal.     ED Results and  Treatments Labs (all labs ordered are listed, but only abnormal results are displayed) Labs Reviewed  COMPREHENSIVE METABOLIC PANEL - Abnormal; Notable for the following components:      Result Value   Glucose, Bld 119 (*)    Calcium 8.7 (*)    Total Protein 6.4 (*)    Albumin 2.8 (*)    All other components within normal limits  CBC WITH DIFFERENTIAL/PLATELET - Abnormal; Notable for the following components:   RBC 4.13 (*)    Hemoglobin 11.5 (*)    HCT 34.4 (*)    Platelets 453 (*)    Lymphs Abs 0.4 (*)    All other components within normal limits  URINALYSIS, ROUTINE W REFLEX MICROSCOPIC - Abnormal; Notable for the following components:   Ketones, ur 5 (*)    All other components within normal limits  LIPASE, BLOOD  HIV ANTIBODY (ROUTINE TESTING W REFLEX)                                                                                                                         EKG  EKG Interpretation  Date/Time:    Ventricular Rate:    PR Interval:    QRS Duration:   QT Interval:    QTC Calculation:   R Axis:     Text Interpretation:         Radiology CT ABDOMEN PELVIS W CONTRAST  Result Date: 07/16/2022 CLINICAL DATA:  Postop abdominal pain status post rectal mass excision EXAM: CT ABDOMEN AND PELVIS WITH CONTRAST TECHNIQUE: Multidetector CT imaging of the abdomen and pelvis was performed using the standard protocol following bolus administration of intravenous contrast. RADIATION DOSE REDUCTION: This exam  was performed according to the departmental dose-optimization program which includes automated exposure control, adjustment of the mA and/or kV according to patient size and/or use of iterative reconstruction technique. CONTRAST:  119m OMNIPAQUE IOHEXOL 300 MG/ML  SOLN COMPARISON:  MR pelvis dated 06/17/2022. Outside hospital [facility] CT abdomen/pelvis dated 02/18/2022. FINDINGS: Lower chest: Lung bases are clear. Hepatobiliary: Mildly heterogeneous hepatic perfusion which  normalizes on delayed imaging. Gallbladder is unremarkable. No intrahepatic or extrahepatic duct dilatation. Pancreas: Within normal limits. Spleen: Within normal limits. Adrenals/Urinary Tract: Adrenal glands are within normal limits. Bilateral renal sinus cysts, without hydronephrosis on delayed imaging. Kidneys are otherwise within normal limits. Bladder is mildly thick-walled although underdistended. Stomach/Bowel: Stomach is within normal limits. No evidence of bowel obstruction. Status post low anterior resection with diverting ileostomy and suture line in the lower pelvis. Vascular/Lymphatic: No evidence of abdominal aortic aneurysm. No suspicious abdominopelvic lymphadenopathy. Reproductive: Prostate is unremarkable. Other: Pelvic fluid and gas collection, measuring 21.1 cm (AP) x 6.3 cm (craniocaudal) x 4.7 cm (transverse) on sagittal image 109 and axial image 68. Associated mild to moderate free air, predominantly beneath the anterior abdominal wall (series 2/image 26). Musculoskeletal: Visualized osseous structures are within normal limits. IMPRESSION: Status post low anterior resection with diverting ileostomy. Moderate pelvic fluid and gas collection, as described above, compatible with postoperative abscess. Associated mild to moderate free air, predominantly beneath the anterior abdominal wall. In the setting of known recent surgical drain (reportedly removed yesterday), this finding may be related to the drain, but is more than would be expected. As such, a perforated viscus (specifically disruption at the surgical anastomosis) is not excluded. These results were called by telephone at the time of interpretation on 07/16/2022 at 1:32 am [facility] who verbally acknowledged these results. Electronically Signed   By: SJulian HyM.D.   On: 07/16/2022 01:36    Pertinent labs & imaging results that were available during my care of the patient were reviewed by me and considered in  my medical decision making (see MDM for details).  Medications Ordered in ED Medications  sodium chloride 0.9 % bolus 1,000 mL (1,000 mLs Intravenous New Bag/Given 07/16/22 0031)    Followed by  0.9 %  sodium chloride infusion (has no administration in time range)  Ampicillin-Sulbactam (UNASYN) 3 g in sodium chloride 0.9 % 100 mL IVPB (has no administration in time range)  dextrose 5 % and 0.45 % NaCl with KCl 20 mEq/L infusion (has no administration in time range)  Ampicillin-Sulbactam (UNASYN) 3 g in sodium chloride 0.9 % 100 mL IVPB (has no administration in time range)  acetaminophen (TYLENOL) tablet 650 mg (has no administration in time range)    Or  acetaminophen (TYLENOL) suppository 650 mg (has no administration in time range)  oxyCODONE (Oxy IR/ROXICODONE) immediate release tablet 5-10 mg (has no administration in time range)  HYDROmorphone (DILAUDID) injection 1 mg (has no administration in time range)  ondansetron (ZOFRAN-ODT) disintegrating tablet 4 mg (has no administration in time range)    Or  ondansetron (ZOFRAN) injection 4 mg (has no administration in time range)  sodium chloride 0.9 % bolus 1,000 mL (has no administration in time range)  HYDROmorphone (DILAUDID) injection 1 mg (1 mg Intravenous Given 07/16/22 0030)  iohexol (OMNIPAQUE) 300 MG/ML solution 100 mL (100 mLs Intravenous Contrast Given 07/16/22 0046)  Procedures Procedures  (including critical care time)  Medical Decision Making / ED Course    Complexity of Problem:  Co-morbidities/SDOH that complicate the patient evaluation/care: Noted above in HPI  Additional history obtained: Recent admission for his surgery  Patient's presenting problem/concern, DDX, and MDM listed below: Rectal pain Patient is afebrile with no abdominal discomfort or tenderness to palpation Lower  concern for infectious process but will need to assess regardless. Most suspicious for likely retained seroma/hematoma.  Hospitalization Considered:  yes  Initial Intervention:  IV fluids, IV pain medicine    Complexity of Data:    Laboratory Tests ordered listed below with my independent interpretation: CBC without leukocytosis.  Mild anemia. CMP no significant electrolyte derangements or renal sufficiency. UA without evidence of infection   Imaging Studies ordered listed below with my independent interpretation: CT of the abdomen/pelvis notable for was notable for fluid collection within the pelvic region concerning for abscess.  Also noted to have free air.     ED Course:    Assessment, Add'l Intervention, and Reassessment: Rectal pain Work-up notable for possible postoperative infection.  Patient does not appear to be septic. I spoke with Dr. Harlow Asa from general surgery who agreed to admit patient. He recommended starting the patient on Unasyn    Final Clinical Impression(s) / ED Diagnoses Final diagnoses:  Postprocedural intraabdominal abscess           This chart was dictated using voice recognition software.  Despite best efforts to proofread,  errors can occur which can change the documentation meaning.    Fatima Blank, MD 07/16/22 [phone]

## 2022-07-15 NOTE — ED Triage Notes (Signed)
Patient coming to ED for evaluation of post op pain and complications.  Reports he has rectal cancer and had surgery on 7/6 to "remove a mass."  Had JP drain.  Reports pain has become severe and worsened when JP drain was removed.  Called surgery/oncology and told to come to ED

## 2022-07-16 ENCOUNTER — Encounter (HOSPITAL_COMMUNITY): Payer: Self-pay

## 2022-07-16 ENCOUNTER — Observation Stay (HOSPITAL_COMMUNITY): Payer: BC Managed Care – PPO

## 2022-07-16 ENCOUNTER — Emergency Department (HOSPITAL_COMMUNITY): Payer: BC Managed Care – PPO

## 2022-07-16 DIAGNOSIS — K651 Peritoneal abscess: Secondary | ICD-10-CM | POA: Diagnosis present

## 2022-07-16 LAB — URINALYSIS, ROUTINE W REFLEX MICROSCOPIC
Bilirubin Urine: NEGATIVE
Glucose, UA: NEGATIVE mg/dL
Hgb urine dipstick: NEGATIVE
Ketones, ur: 5 mg/dL — AB
Leukocytes,Ua: NEGATIVE
Nitrite: NEGATIVE
Protein, ur: NEGATIVE mg/dL
Specific Gravity, Urine: 1.025 (ref 1.005–1.030)
pH: 5 (ref 5.0–8.0)

## 2022-07-16 LAB — HIV ANTIBODY (ROUTINE TESTING W REFLEX): HIV Screen 4th Generation wRfx: NONREACTIVE

## 2022-07-16 MED ORDER — FENTANYL CITRATE (PF) 100 MCG/2ML IJ SOLN
INTRAMUSCULAR | Status: AC | PRN
  Administered 2022-07-16: 50 ug via INTRAVENOUS

## 2022-07-16 MED ORDER — SODIUM CHLORIDE 0.9 % IV SOLN
3.0000 g | Freq: Once | INTRAVENOUS | Status: AC
  Administered 2022-07-16: 3 g via INTRAVENOUS
  Filled 2022-07-16: qty 8

## 2022-07-16 MED ORDER — ACETAMINOPHEN 650 MG RE SUPP: 650.0000 mg | Freq: Four times a day (QID) | RECTAL | Status: DC | PRN

## 2022-07-16 MED ORDER — OXYCODONE HCL 5 MG PO TABS
5.0000 mg | ORAL_TABLET | ORAL | Status: DC | PRN
  Administered 2022-07-16 – 2022-07-19 (×9): 10 mg via ORAL
  Filled 2022-07-16 (×10): qty 2

## 2022-07-16 MED ORDER — ONDANSETRON 4 MG PO TBDP: 4.0000 mg | ORAL_TABLET | Freq: Four times a day (QID) | ORAL | Status: DC | PRN

## 2022-07-16 MED ORDER — SODIUM CHLORIDE 0.9 % IV BOLUS (SEPSIS)
1000.0000 mL | Freq: Once | INTRAVENOUS | Status: AC
  Administered 2022-07-16: 1000 mL via INTRAVENOUS

## 2022-07-16 MED ORDER — HYDROMORPHONE HCL 2 MG/ML IJ SOLN: 2.0000 mg | Freq: Once | INTRAMUSCULAR | Status: DC

## 2022-07-16 MED ORDER — PIPERACILLIN-TAZOBACTAM 3.375 G IVPB
3.3750 g | Freq: Three times a day (TID) | INTRAVENOUS | Status: DC
  Administered 2022-07-16 – 2022-07-19 (×8): 3.375 g via INTRAVENOUS
  Filled 2022-07-16 (×10): qty 50

## 2022-07-16 MED ORDER — FENTANYL CITRATE (PF) 100 MCG/2ML IJ SOLN
INTRAMUSCULAR | Status: AC
  Filled 2022-07-16: qty 4

## 2022-07-16 MED ORDER — HYDROMORPHONE HCL 1 MG/ML IJ SOLN
1.0000 mg | INTRAMUSCULAR | Status: DC | PRN
  Administered 2022-07-16 – 2022-07-19 (×17): 1 mg via INTRAVENOUS
  Filled 2022-07-16 (×19): qty 1

## 2022-07-16 MED ORDER — SODIUM CHLORIDE 0.9 % IV SOLN: 3.0000 g | Freq: Four times a day (QID) | INTRAVENOUS | Status: DC

## 2022-07-16 MED ORDER — SODIUM CHLORIDE 0.9 % IV SOLN
INTRAVENOUS | Status: AC
  Filled 2022-07-16: qty 250

## 2022-07-16 MED ORDER — MIDAZOLAM HCL 2 MG/2ML IJ SOLN
INTRAMUSCULAR | Status: AC | PRN
  Administered 2022-07-16: 2 mg via INTRAVENOUS

## 2022-07-16 MED ORDER — ACETAMINOPHEN 325 MG PO TABS
650.0000 mg | ORAL_TABLET | Freq: Four times a day (QID) | ORAL | Status: DC | PRN
  Administered 2022-07-16 – 2022-07-19 (×6): 650 mg via ORAL
  Filled 2022-07-16 (×6): qty 2

## 2022-07-16 MED ORDER — KCL IN DEXTROSE-NACL 20-5-0.45 MEQ/L-%-% IV SOLN
INTRAVENOUS | Status: DC
  Filled 2022-07-16 (×3): qty 1000

## 2022-07-16 MED ORDER — SODIUM CHLORIDE 0.9 % IV SOLN: 1000.0000 mL | INTRAVENOUS | Status: DC

## 2022-07-16 MED ORDER — LIDOCAINE-EPINEPHRINE 1 %-1:100000 IJ SOLN
INTRAMUSCULAR | Status: AC | PRN
  Administered 2022-07-16: 10 mL via INTRADERMAL

## 2022-07-16 MED ORDER — ONDANSETRON HCL 4 MG/2ML IJ SOLN: 4.0000 mg | Freq: Four times a day (QID) | INTRAMUSCULAR | Status: DC | PRN

## 2022-07-16 MED ORDER — IOHEXOL 300 MG/ML  SOLN
100.0000 mL | Freq: Once | INTRAMUSCULAR | Status: AC | PRN
  Administered 2022-07-16: 100 mL via INTRAVENOUS

## 2022-07-16 MED ORDER — MIDAZOLAM HCL 2 MG/2ML IJ SOLN
INTRAMUSCULAR | Status: AC
  Filled 2022-07-16: qty 4

## 2022-07-16 NOTE — Procedures (Signed)
Interventional Radiology Procedure Note  Procedure: CT guided pelvic drain placement  Findings: Please refer to procedural dictation for full description. Left transgluteal 10.2 Fr drain placed in perirectal abscess yielding approximately 150 mL purulent fluid.  Complications: None immediate  Estimated Blood Loss: < 5 mL  Recommendations: Keep to bulb suction. IR will follow.   Ruthann Cancer, MD

## 2022-07-16 NOTE — Progress Notes (Signed)
Pharmacy Antibiotic Note  Bryan [name] is a 51 y.o. male admitted on 07/15/2022 with IAI.  Pharmacy has been consulted for zosyn dosing.  Plan: Zosyn 3.375g IV q8h (4 hour infusion). Pharmacy to sign off  Height: '5\' 10"'$  (177.8 cm) Weight: 83 kg (182 lb 15.7 oz) IBW/kg (Calculated) : 73  Temp (24hrs), Avg:99.2 F (37.3 C), Min:98.5 F (36.9 C), Max:99.8 F (37.7 C)  Recent Labs  Lab 07/15/22 2158  WBC 8.8  CREATININE 1.12    Estimated Creatinine Clearance: 79.7 mL/min (by C-G formula based on SCr of 1.12 mg/dL).    No Known Allergies  Antimicrobials this admission: 7/21 Unasyn x 1 7/21 Zosyn>> Dose adjustments this admission:  Microbiology results:  Thank you for allowing pharmacy to be a part of this patient's care.   Eudelia Bunch, Pharm.D 07/16/2022 8:52 AM

## 2022-07-16 NOTE — Progress Notes (Signed)
Subjective Well known to me. OR 07/01/22 -   Bryan [name] Diagnostic flexible sigmoidoscopy (necessary to localize lesion) Intraoperative assessment of perfusion using ICG fluorescence imaging Bilateral transversus abdominus plane (TAP) blocks  Postoperatively had postoperative ileus that resolved. He was ultimately discharged home 07/09/22. He had been doing reasonably well at home and his JP drain output decreased and was serosanguinous, this was removed 2 days ago. He presented to the ER yesterday evening with abdominal pain and decreased appetite. 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 He underwent work-up where he was found to have a normal WBC.  CT abdomen/pelvis demonstrated expected postoperative anatomy with now a complex pelvic fluid and gas collection compatible with "postoperative abscess."  Does have a small amount of free air beneath intra-abdominal wall but just had his JP drain removed.  My partner was consulted on him overnight.  I was made aware of his presence in the emergency room this morning.  He reports that he is currently feeling well with the exception of some mild lower abdominal discomfort.  No nausea or vomiting.  He does report a decreased appetite.  Objective: Vital signs in last 24 hours: Temp:  [98.5 F (36.9 C)-99.8 F (37.7 C)] 99.4 F (37.4 C) (07/21 0757) Pulse Rate:  [76-87] 86 (07/21 0757) Resp:  [16-18] 16 (07/21 0757) BP: (91-136)/(65-97) 123/73 (07/21 0757) SpO2:  [97 %-100 %] 100 % (07/21 0757) Weight:  [83 kg] 83 kg (07/21 0757)    Intake/Output from previous day: No intake/output data recorded. Intake/Output this shift: Total I/O In: 159.8 [I.V.:159.8] Out: -   Gen: NAD, comfortable CV: RRR Pulm: Normal work of breathing Abd: Soft, not significantly tender nor distended.  Ileostomy is pink. Ext: SCDs in place  Lab Results: CBC  Recent Labs    07/15/22 2158  WBC 8.8   HGB 11.5*  HCT 34.4*  PLT 453*   BMET Recent Labs    07/15/22 2158  NA 136  K 4.5  CL 104  CO2 24  GLUCOSE 119*  BUN 15  CREATININE 1.12  CALCIUM 8.7*   PT/INR No results for input(s): "LABPROT", "INR" in the last 72 hours. ABG No results for input(s): "PHART", "HCO3" in the last 72 hours.  Invalid input(s): "PCO2", "PO2"  Studies/Results:  Anti-infectives: Anti-infectives (From admission, onward)    Start     Dose/Rate Route Frequency Ordered Stop   07/16/22 0900  Ampicillin-Sulbactam (UNASYN) 3 g in sodium chloride 0.9 % 100 mL IVPB        3 g 200 mL/hr over 30 Minutes Intravenous Every 6 hours 07/16/22 0218 07/23/22 0859   07/16/22 0215  Ampicillin-Sulbactam (UNASYN) 3 g in sodium chloride 0.9 % 100 mL IVPB        3 g 200 mL/hr over 30 Minutes Intravenous  Once 07/16/22 0206 07/16/22 0731        Assessment/Plan: Patient Active Problem List   Diagnosis Date Noted   Abdominopelvic abscess ([place]) 07/16/2022   Intra-abdominal abscess ([place]) 07/16/2022   S/P laparoscopic-assisted sigmoidectomy 07/01/2022   Rectal cancer ([place]) 08/18/2021   Routine general medical examination at a health care facility 10/19/2011   Lipoma of abdominal wall 10/19/2011   Tendonitis of elbow, right 10/19/2011   Allergic rhinitis, cause unspecified    Bryan [name] is a very pleasant 34yoM hx of rectal cancer, TNT now s/p robotic LAR/DLI 07/01/22 whom has developed what I suspect to be anastomotic leak from his ultra-low anterior resection [name].  -  NPO, MIVF, IV abx -we will place on IV Zosyn for now -IR consultation, Dr. Vernard Gambles.  I spoke with him this morning and they are planning transgluteal drain placement of the pelvic abscess collection. -He has admission orders are already in place for medical surgical ward -Diet as tolerated following his IR procedure. -Spent at bedside today reviewing the findings of the CT scan and plans moving forward.  All of his questions have been  answered and he expresses understanding and agreement with the plan   LOS: 0 days   I spent a total of 60 minutes in both face-to-face and non-face-to-face activities, excluding procedures performed, for this visit on the date of this encounter.  Nadeen Landau, [facility], [place]

## 2022-07-16 NOTE — H&P (Signed)
Chief Complaint: Patient was seen in consultation today for pelvic abscess  at the request of Nadeen Landau, MD  Referring Physician(s): Nadeen Landau, MD  Supervising Physician: Arne Cleveland  Patient Status: [facility] - In-pt  History of Present Illness: Bryan [name] is a 51 y.o. male with past medical history significant for rectal cancer s/p chemo/radiation and recent sigmoidectomy and rectal mass resection. Pt had JP drain pulled at his surgical f/u visit 07/14/22. He presented to ED 07/15/22 c/o significantly increased rectal pain and pinkish/Manzo drainage from rectum. Pt had CT abd/pelvis that showed pelvic fluid/gas collection. Pt was referred to IR for pelvic abscess drain by Nadeen Landau, MD. Procedure was approved by Dr. Vernard Gambles, IR for transgluteal drain.   Past Medical History:  Diagnosis Date   Allergic rhinitis, cause unspecified    spring only   Hypercholesteremia    Rectal cancer ([place]) 09/14/2021    Past Surgical History:  Procedure Laterality Date   DIVERTING ILEOSTOMY N/A 07/01/2022   Procedure: DIVERTING LOOP ILEOSTOMY;  Surgeon: Ileana Roup, MD;  Location: [facility];  Service: General;  Laterality: N/A;   FLEXIBLE SIGMOIDOSCOPY N/A 07/01/2022   Procedure: FLEXIBLE SIGMOIDOSCOPY;  Surgeon: Ileana Roup, MD;  Location: [facility];  Service: General;  Laterality: N/A;   None     XI ROBOTIC ASSISTED LOWER ANTERIOR RESECTION N/A 07/01/2022   Procedure: XI ROBOTIC ASSISTED LOWER ANTERIOR RESECTION STAPLED COLOANAL ANASTAMOSIS INTRAOP ASSESSMENT OF PERFUSION AND TAPP BLOCKS;  Surgeon: Ileana Roup, MD;  Location: [facility];  Service: General;  Laterality: N/A;    Allergies: Patient has no known allergies.  Medications: Prior to Admission medications   Medication Sig Start Date End Date Taking? Authorizing Provider  acetaminophen (TYLENOL) 500 MG tablet Take 1,000 mg by mouth every 6 (six) hours as needed for moderate pain.   Yes [provider]  Cyanocobalamin (B-12 PO) Take 1 tablet by mouth daily.   Yes [provider]  ibuprofen (ADVIL) 800 MG tablet Take 1 tablet (800 mg total) by mouth 2 (two) times daily as needed for moderate pain. Take with food. Follow up with PCP for refills 02/05/22  Yes Truitt Merle, MD  oxyCODONE (OXY IR/ROXICODONE) 5 MG immediate release tablet Take 5 mg by mouth every 4 (four) hours as needed for severe pain. 07/13/22  Yes [provider]  loperamide (IMODIUM) 2 MG capsule Take 1 capsule (2 mg total) by mouth 2 (two) times daily. Patient taking differently: Take 2 mg by mouth as needed for diarrhea or loose stools. 07/07/22 09/05/22  Ileana Roup, MD     Family History  Problem Relation Age of Onset   Cancer Mother 52       Breast   Alcohol abuse Father    Cancer Paternal Aunt        breast   Breast cancer Paternal Aunt    Diabetes Paternal Aunt    Heart disease Paternal Aunt    Breast cancer Cousin    Cancer Paternal Great-grandfather        prostate   Prostate cancer Paternal Great-grandfather    Hypertension Neg Hx    Colon polyps Neg Hx    Esophageal cancer Neg Hx    Rectal cancer Neg Hx    Stomach cancer Neg Hx     Social History   Socioeconomic History   Marital status: Married    Spouse name: Not on file   Number of children: 3   Years of education:  Not on file   Highest education level: Not on file  Occupational History   Occupation: Driver/yard work    Fish farm manager: [employer]    Comment: nights  Tobacco Use   Smoking status: Never   Smokeless tobacco: Never  Vaping Use   Vaping Use: Never used  Substance and Sexual Activity   Alcohol use: No   Drug use: No   Sexual activity: Not on file  Other Topics Concern   Not on file  Social History Narrative   3 children--ages 26, 101, 21 (as of 10/14/21)   Social Determinants of Health   Financial Resource Strain: Not on file  Food Insecurity: Not on file  Transportation Needs: Not on file   Physical Activity: Not on file  Stress: Not on file  Social Connections: Not on file   Review of Systems: A 12 point ROS discussed and pertinent positives are indicated in the HPI above.  All other systems are negative.  Review of Systems  Constitutional:  Negative for appetite change, chills and fever.  Respiratory:  Negative for shortness of breath.   Cardiovascular:  Negative for chest pain and leg swelling.  Gastrointestinal:  Positive for abdominal pain.  Genitourinary:  Positive for difficulty urinating and dysuria. Negative for hematuria.  Neurological:  Negative for dizziness, weakness and headaches.    Vital Signs: BP 123/73 (BP Location: Left Arm)   Pulse 86   Temp 99.4 F (37.4 C) (Oral)   Resp 16   Ht '5\' 10"'$  (1.778 m)   Wt 182 lb 15.7 oz (83 kg)   SpO2 100%   BMI 26.26 kg/m     Physical Exam Vitals reviewed.  Constitutional:      General: He is not in acute distress.    Appearance: Normal appearance. He is not ill-appearing.  HENT:     Head: Normocephalic and atraumatic.     Mouth/Throat:     Mouth: Mucous membranes are dry.     Pharynx: Oropharynx is clear. No oropharyngeal exudate.  Eyes:     Extraocular Movements: Extraocular movements intact.     Pupils: Pupils are equal, round, and reactive to light.  Cardiovascular:     Rate and Rhythm: Normal rate and regular rhythm.     Pulses: Normal pulses.     Heart sounds: Normal heart sounds. No murmur heard. Pulmonary:     Effort: Pulmonary effort is normal. No respiratory distress.     Breath sounds: Normal breath sounds.  Abdominal:     General: Abdomen is flat. Bowel sounds are normal. There is no distension.     Palpations: Abdomen is soft.     Tenderness: There is abdominal tenderness. There is no guarding.  Musculoskeletal:     Right lower leg: No edema.     Left lower leg: No edema.  Skin:    General: Skin is warm and dry.  Neurological:     Mental Status: He is alert and oriented to  person, place, and time.  Psychiatric:        Mood and Affect: Mood normal.        Behavior: Behavior normal.        Thought Content: Thought content normal.        Judgment: Judgment normal.     Imaging: CT ABDOMEN PELVIS W CONTRAST  Result Date: 07/16/2022 CLINICAL DATA:  Postop abdominal pain status post rectal mass excision EXAM: CT ABDOMEN AND PELVIS WITH CONTRAST TECHNIQUE: Multidetector CT imaging of the abdomen and pelvis  was performed using the standard protocol following bolus administration of intravenous contrast. RADIATION DOSE REDUCTION: This exam was performed according to the departmental dose-optimization program which includes automated exposure control, adjustment of the mA and/or kV according to patient size and/or use of iterative reconstruction technique. CONTRAST:  158m OMNIPAQUE IOHEXOL 300 MG/ML  SOLN COMPARISON:  MR pelvis dated 06/17/2022. Outside hospital ([facility] CT abdomen/pelvis dated 02/18/2022. FINDINGS: Lower chest: Lung bases are clear. Hepatobiliary: Mildly heterogeneous hepatic perfusion which normalizes on delayed imaging. Gallbladder is unremarkable. No intrahepatic or extrahepatic duct dilatation. Pancreas: Within normal limits. Spleen: Within normal limits. Adrenals/Urinary Tract: Adrenal glands are within normal limits. Bilateral renal sinus cysts, without hydronephrosis on delayed imaging. Kidneys are otherwise within normal limits. Bladder is mildly thick-walled although underdistended. Stomach/Bowel: Stomach is within normal limits. No evidence of bowel obstruction. Status post low anterior resection with diverting ileostomy and suture line in the lower pelvis. Vascular/Lymphatic: No evidence of abdominal aortic aneurysm. No suspicious abdominopelvic lymphadenopathy. Reproductive: Prostate is unremarkable. Other: Pelvic fluid and gas collection, measuring 21.1 cm (AP) x 6.3 cm (craniocaudal) x 4.7 cm (transverse) on sagittal image 109 and axial image 68.  Associated mild to moderate free air, predominantly beneath the anterior abdominal wall (series 2/image 26). Musculoskeletal: Visualized osseous structures are within normal limits. IMPRESSION: Status post low anterior resection with diverting ileostomy. Moderate pelvic fluid and gas collection, as described above, compatible with postoperative abscess. Associated mild to moderate free air, predominantly beneath the anterior abdominal wall. In the setting of known recent surgical drain (reportedly removed yesterday), this finding may be related to the drain, but is more than would be expected. As such, a perforated viscus (specifically disruption at the surgical anastomosis) is not excluded. These results were called by telephone at the time of interpretation on 07/16/2022 at 1:32 am to [facility], who verbally acknowledged these results. Electronically Signed   By: SJulian HyM.D.   On: 07/16/2022 01:36    Labs:  CBC: Recent Labs    07/01/22 0527 07/02/22 0508 07/03/22 0531 07/15/22 2158  WBC 3.7* 6.9 8.4 8.8  HGB 13.6 12.1* 13.7 11.5*  HCT 40.9 36.7* 41.2 34.4*  PLT 199 171 182 453*    COAGS: No results for input(s): "INR", "APTT" in the last 8760 hours.  BMP: Recent Labs    07/02/22 0508 07/03/22 0531 07/04/22 0447 07/15/22 2158  NA 136 133* 133* 136  K 4.2 4.8 4.3 4.5  CL 105 101 102 104  CO2 '24 24 23 24  '$ GLUCOSE 129* 198* 121* 119*  BUN '14 13 14 15  '$ CALCIUM 8.5* 8.9 8.6* 8.7*  CREATININE 1.22 1.11 1.12 1.12  GFRNONAA >60 >60 >60 >60    LIVER FUNCTION TESTS: Recent Labs    04/12/22 1250 05/14/22 1406 07/01/22 0527 07/15/22 2158  BILITOT 0.4 0.5 0.9 0.5  AST 24 32 25 17  ALT 30 49* 32 43  ALKPHOS 90 100 73 97  PROT 7.4 6.9 6.9 6.4*  ALBUMIN 4.2 4.1 4.2 2.8*    TUMOR MARKERS: Recent Labs    09/14/21 1654  CEA 3.9*    Assessment and Plan: Mr. BAryahas previous history of rectal cancer s/p chemo/radiation and recent sigmoidectomy and  rectal mass resection. Pt had JP drain pulled at his surgical f/u visit 07/14/22. He presented to ED 07/15/22 c/o significantly increased rectal pain and pinkish/Riddell drainage from rectum. Pt had CT abd/pelvis that showed pelvic fluid/gas collection. Pt was referred to IR for pelvic abscess  drain by Nadeen Landau, MD. Procedure was approved by Dr. Vernard Gambles, IR for transgluteal drain.   Pt resting on stretcher. He is A&O, calm and pleasant.  He is in no distress. Pt states he is NPO per order.  He denies the use of AC/AP.   Risks and benefits discussed with the patient including bleeding, infection, damage to adjacent structures, bowel perforation/fistula connection, and sepsis.  All of the patient's questions were answered, patient is agreeable to proceed. Consent signed and in chart.   Thank you for this interesting consult.  I greatly enjoyed meeting Sakari Alkhatib and look forward to participating in their care.  A copy of this report was sent to the requesting provider on this date.  Electronically Signed: Tyson Alias, NP 07/16/2022, 11:04 AM   I spent a total of 20 minutes in face to face in clinical consultation, greater than 50% of which was counseling/coordinating care for pelvic abscess.

## 2022-07-17 ENCOUNTER — Other Ambulatory Visit (HOSPITAL_COMMUNITY): Payer: Self-pay

## 2022-07-17 DIAGNOSIS — Z8249 Family history of ischemic heart disease and other diseases of the circulatory system: Secondary | ICD-10-CM | POA: Diagnosis not present

## 2022-07-17 DIAGNOSIS — Z9049 Acquired absence of other specified parts of digestive tract: Secondary | ICD-10-CM | POA: Diagnosis not present

## 2022-07-17 DIAGNOSIS — Z811 Family history of alcohol abuse and dependence: Secondary | ICD-10-CM | POA: Diagnosis not present

## 2022-07-17 DIAGNOSIS — T8143XA Infection following a procedure, organ and space surgical site, initial encounter: Secondary | ICD-10-CM | POA: Diagnosis present

## 2022-07-17 DIAGNOSIS — K611 Rectal abscess: Secondary | ICD-10-CM | POA: Diagnosis present

## 2022-07-17 DIAGNOSIS — Z923 Personal history of irradiation: Secondary | ICD-10-CM | POA: Diagnosis not present

## 2022-07-17 DIAGNOSIS — Z803 Family history of malignant neoplasm of breast: Secondary | ICD-10-CM | POA: Diagnosis not present

## 2022-07-17 DIAGNOSIS — Z9221 Personal history of antineoplastic chemotherapy: Secondary | ICD-10-CM | POA: Diagnosis not present

## 2022-07-17 DIAGNOSIS — Z8042 Family history of malignant neoplasm of prostate: Secondary | ICD-10-CM | POA: Diagnosis not present

## 2022-07-17 DIAGNOSIS — E7800 Pure hypercholesterolemia, unspecified: Secondary | ICD-10-CM | POA: Diagnosis present

## 2022-07-17 DIAGNOSIS — K6289 Other specified diseases of anus and rectum: Secondary | ICD-10-CM | POA: Diagnosis present

## 2022-07-17 DIAGNOSIS — K651 Peritoneal abscess: Secondary | ICD-10-CM | POA: Diagnosis present

## 2022-07-17 DIAGNOSIS — Z85048 Personal history of other malignant neoplasm of rectum, rectosigmoid junction, and anus: Secondary | ICD-10-CM | POA: Diagnosis not present

## 2022-07-17 DIAGNOSIS — Z833 Family history of diabetes mellitus: Secondary | ICD-10-CM | POA: Diagnosis not present

## 2022-07-17 MED ORDER — NORMAL SALINE FLUSH 0.9 % IV SOLN
5.0000 mL | Freq: Every day | INTRAVENOUS | 0 refills | Status: AC
  Filled 2022-07-17: qty 150, 15d supply, fill #0
  Filled 2022-07-26: qty 90, 9d supply, fill #0
  Filled 2022-08-03: qty 60, 6d supply, fill #1

## 2022-07-17 NOTE — Progress Notes (Signed)
Transition of Care [facility]) Screening Note  Patient Details  Name: [name] Date of Birth: 01-27-1971  Transition of Care [facility]) CM/SW Contact:    Sherie Don, LCSW Phone Number: 07/17/2022, 10:11 AM  Transition of Care Department [facility]) has reviewed patient and no TOC needs have been identified at this time. We will continue to monitor patient advancement through interdisciplinary progression rounds. If new patient transition needs arise, please place a TOC consult.

## 2022-07-17 NOTE — Progress Notes (Signed)
  Subjective  OR 07/01/22  Robotic-assisted ultra-low anterior resection with double stapled coloanal anastomosis Diagnostic flexible sigmoidoscopy (necessary to localize lesion) Intraoperative assessment of perfusion using ICG fluorescence imaging Bilateral transversus abdominus plane (TAP) blocks  Pt with post op pelvic abscess.  S/p TG drain placement yesterday  Having pain at drain site  Objective: Vital signs in last 24 hours: Temp:  [98.6 F (37 C)-100.4 F (38 C)] 98.7 F (37.1 C) (07/22 0525) Pulse Rate:  [74-90] 78 (07/22 0525) Resp:  [12-21] 18 (07/22 0525) BP: (109-128)/(71-82) 113/74 (07/22 0525) SpO2:  [98 %-100 %] 100 % (07/22 0525) Last BM Date : 07/16/22  Intake/Output from previous day: 07/21 0701 - 07/22 0700 In: 2842.8 [P.O.:210; I.V.:2566.2; IV Piggyback:66.5] Out: 2025 [KYHCW:2376; Drains:245; Stool:895] Intake/Output this shift: No intake/output data recorded.  Gen: NAD, comfortable CV: RRR Pulm: Normal work of breathing Abd: Soft, not significantly tender nor distended.  Ileostomy is pink. JP with purulent drainage Ext: SCDs in place  Lab Results: CBC  Recent Labs    07/15/22 2158  WBC 8.8  HGB 11.5*  HCT 34.4*  PLT 453*    BMET Recent Labs    07/15/22 2158  NA 136  K 4.5  CL 104  CO2 24  GLUCOSE 119*  BUN 15  CREATININE 1.12  CALCIUM 8.7*    PT/INR No results for input(s): "LABPROT", "INR" in the last 72 hours. ABG No results for input(s): "PHART", "HCO3" in the last 72 hours.  Invalid input(s): "PCO2", "PO2"  Studies/Results:  Anti-infectives: Anti-infectives (From admission, onward)    Start     Dose/Rate Route Frequency Ordered Stop   07/16/22 1000  piperacillin-tazobactam (ZOSYN) IVPB 3.375 g        3.375 g 12.5 mL/hr over 240 Minutes Intravenous Every 8 hours 07/16/22 0853     07/16/22 0900  Ampicillin-Sulbactam (UNASYN) 3 g in sodium chloride 0.9 % 100 mL IVPB  Status:  Discontinued        3 g 200 mL/hr over  30 Minutes Intravenous Every 6 hours 07/16/22 0218 07/16/22 0836   07/16/22 0215  Ampicillin-Sulbactam (UNASYN) 3 g in sodium chloride 0.9 % 100 mL IVPB        3 g 200 mL/hr over 30 Minutes Intravenous  Once 07/16/22 0206 07/16/22 0731        Assessment/Plan: Patient Active Problem List   Diagnosis Date Noted   Abdominopelvic abscess ([place]) 07/16/2022   Intra-abdominal abscess ([place]) 07/16/2022   S/P laparoscopic-assisted sigmoidectomy 07/01/2022   Rectal cancer ([place]) 08/18/2021   Routine general medical examination at a health care facility 10/19/2011   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 Pargas is a very pleasant 37yoM hx of rectal cancer, TNT now s/p robotic LAR/DLI 07/01/22 whom has developed what I suspect to be anastomotic leak from his ultra-low anterior resection anastomosis.  -Cont reg diet, MIVF, IV abx  -await cx results - PO pain meds    LOS: 0 days   Rosario Adie, MD  [facility]

## 2022-07-17 NOTE — Progress Notes (Signed)
Referring Physician(s): Nadeen Landau  Supervising Physician: Markus Daft  Patient Status:  [facility] - In-pt  Chief Complaint:  1 day s/p Transgluteal drain into pelvic abscess  Subjective:  Pain at drain site Improved pain in pelvis Resting in bed  Allergies: Patient has no known allergies.  Medications: Prior to Admission medications   Medication Sig Start Date End Date Taking? Authorizing Provider  acetaminophen (TYLENOL) 500 MG tablet Take 1,000 mg by mouth every 6 (six) hours as needed for moderate pain.   Yes [provider]  Cyanocobalamin (B-12 PO) Take 1 tablet by mouth daily.   Yes [provider]  ibuprofen (ADVIL) 800 MG tablet Take 1 tablet (800 mg total) by mouth 2 (two) times daily as needed for moderate pain. Take with food. Follow up with PCP for refills 02/05/22  Yes Truitt Merle, MD  oxyCODONE (OXY IR/ROXICODONE) 5 MG immediate release tablet Take 5 mg by mouth every 4 (four) hours as needed for severe pain. 07/13/22  Yes [provider]  loperamide (IMODIUM) 2 MG capsule Take 1 capsule (2 mg total) by mouth 2 (two) times daily. Patient taking differently: Take 2 mg by mouth as needed for diarrhea or loose stools. 07/07/22 09/05/22  Ileana Roup, MD     Vital Signs: BP 113/72 (BP Location: Right Arm)   Pulse 83   Temp 99.1 F (37.3 C) (Oral)   Resp 16   Ht '5\' 10"'$  (1.778 m)   Wt 182 lb 15.7 oz (83 kg)   SpO2 99%   BMI 26.26 kg/m   Physical Exam Vitals reviewed.  Constitutional:      General: He is not in acute distress.    Appearance: He is not ill-appearing.  HENT:     Head: Normocephalic and atraumatic.     Mouth/Throat:     Pharynx: Oropharynx is clear.  Eyes:     Extraocular Movements: Extraocular movements intact.  Cardiovascular:     Rate and Rhythm: Normal rate.  Pulmonary:     Effort: Pulmonary effort is normal. No respiratory distress.  Abdominal:     General: Abdomen is flat.     Palpations:  Abdomen is soft.  Skin:    General: Skin is warm and dry.  Neurological:     General: No focal deficit present.     Mental Status: He is alert and oriented to person, place, and time.  Psychiatric:        Mood and Affect: Mood normal.        Behavior: Behavior normal.    Drain Location: Left transgluteal Size: Fr size: 10 Fr Date of placement: 07/16/22  Currently to: Drain collection device: suction bulb 24 hour output:  Output by Drain (mL) 07/15/22 0701 - 07/15/22 1900 07/15/22 1901 - 07/16/22 0700 07/16/22 0701 - 07/16/22 1900 07/16/22 1901 - 07/17/22 0700 07/17/22 0701 - 07/17/22 1130  Closed System Drain 1 Left Buttock Bulb (JP) 10 Fr.   170 75 25    Current examination: Flushes/aspirates easily.  Insertion site unremarkable. Suture and stat lock in place. Dressed appropriately.   Imaging: CT IMAGE GUIDED FLUID DRAIN BY CATHETER  Result Date: 07/16/2022 INDICATION: 51 year old male status post rectal mass excision with perirectal pelvic abscess formation. EXAM: CT IMAGE GUIDED FLUID DRAIN BY CATHETER COMPARISON:  07/16/2022 MEDICATIONS: The patient is currently admitted to the hospital and receiving intravenous antibiotics. The antibiotics were administered within an appropriate time frame prior to the initiation of the procedure. ANESTHESIA/SEDATION: Moderate (conscious)  sedation was employed during this procedure. A total of Versed 2 mg and Fentanyl 100 mcg was administered intravenously. Moderate Sedation Time: 14 minutes. The patient's level of consciousness and vital signs were monitored continuously by radiology nursing throughout the procedure under my direct supervision. CONTRAST:  None COMPLICATIONS: None immediate. PROCEDURE: RADIATION DOSE REDUCTION: This exam was performed according to the departmental dose-optimization program which includes automated exposure control, adjustment of the mA and/or kV according to patient size and/or use of iterative reconstruction  technique. Informed written consent was obtained from the patient after a discussion of the risks, benefits and alternatives to treatment. The patient was placed prone on the CT gantry and a pre procedural CT was performed re-demonstrating the known abscess/fluid collection within the lower pelvis. The procedure was planned. A timeout was performed prior to the initiation of the procedure. The left gluteal region was prepped and draped in the usual sterile fashion. The overlying soft tissues were anesthetized with 1% lidocaine with epinephrine. Appropriate trajectory was planned with the use of a 22 gauge spinal needle. An 18 gauge trocar needle was advanced into the abscess/fluid collection and a short Amplatz super stiff wire was coiled within the collection. Appropriate positioning was confirmed with a limited CT scan. The tract was serially dilated allowing placement of a 10.2 [place] all-purpose drainage catheter. Appropriate positioning was confirmed with a limited postprocedural CT scan. Proximally 150 ml of purulent fluid was aspirated. The tube was connected to a bulb suction and sutured in place. A dressing was placed. The patient tolerated the procedure well without immediate post procedural complication. IMPRESSION: Successful CT guided transgluteal placement of a 10.2 [place] all purpose drain catheter into the perirectal fluid collection with aspiration of 150 mL of purulent fluid. Samples were sent to the laboratory as requested by the ordering clinical team. Ruthann Cancer, MD [facility] Radiology Electronically Signed   By: Ruthann Cancer M.D.   On: 07/16/2022 16:31   CT ABDOMEN PELVIS W CONTRAST  Result Date: 07/16/2022 CLINICAL DATA:  Postop abdominal pain status post rectal mass excision EXAM: CT ABDOMEN AND PELVIS WITH CONTRAST TECHNIQUE: Multidetector CT imaging of the abdomen and pelvis was performed using the standard protocol following bolus  administration of intravenous contrast. RADIATION DOSE REDUCTION: This exam was performed according to the departmental dose-optimization program which includes automated exposure control, adjustment of the mA and/or kV according to patient size and/or use of iterative reconstruction technique. CONTRAST:  140m OMNIPAQUE IOHEXOL 300 MG/ML  SOLN COMPARISON:  MR pelvis dated 06/17/2022. Outside hospital ([facility] CT abdomen/pelvis dated 02/18/2022. FINDINGS: Lower chest: Lung bases are clear. Hepatobiliary: Mildly heterogeneous hepatic perfusion which normalizes on delayed imaging. Gallbladder is unremarkable. No intrahepatic or extrahepatic duct dilatation. Pancreas: Within normal limits. Spleen: Within normal limits. Adrenals/Urinary Tract: Adrenal glands are within normal limits. Bilateral renal sinus cysts, without hydronephrosis on delayed imaging. Kidneys are otherwise within normal limits. Bladder is mildly thick-walled although underdistended. Stomach/Bowel: Stomach is within normal limits. No evidence of bowel obstruction. Status post low anterior resection with diverting ileostomy and suture line in the lower pelvis. Vascular/Lymphatic: No evidence of abdominal aortic aneurysm. No suspicious abdominopelvic lymphadenopathy. Reproductive: Prostate is unremarkable. Other: Pelvic fluid and gas collection, measuring 21.1 cm (AP) x 6.3 cm (craniocaudal) x 4.7 cm (transverse) on sagittal image 109 and axial image 68. Associated mild to moderate free air, predominantly beneath the anterior abdominal wall (series 2/image 26). Musculoskeletal: Visualized osseous structures are within normal limits. IMPRESSION: Status  post low anterior resection with diverting ileostomy. Moderate pelvic fluid and gas collection, as described above, compatible with postoperative abscess. Associated mild to moderate free air, predominantly beneath the anterior abdominal wall. In the setting of known recent surgical drain (reportedly  removed yesterday), this finding may be related to the drain, but is more than would be expected. As such, a perforated viscus (specifically disruption at the surgical anastomosis) is not excluded. These results were called by telephone at the time of interpretation on 07/16/2022 at 1:32 am to provider [facility] , who verbally acknowledged these results. Electronically Signed   By: Julian Hy M.D.   On: 07/16/2022 01:36    Labs:  CBC: Recent Labs    07/01/22 0527 07/02/22 0508 07/03/22 0531 07/15/22 2158  WBC 3.7* 6.9 8.4 8.8  HGB 13.6 12.1* 13.7 11.5*  HCT 40.9 36.7* 41.2 34.4*  PLT 199 171 182 453*    COAGS: No results for input(s): "INR", "APTT" in the last 8760 hours.  BMP: Recent Labs    07/02/22 0508 07/03/22 0531 07/04/22 0447 07/15/22 2158  NA 136 133* 133* 136  K 4.2 4.8 4.3 4.5  CL 105 101 102 104  CO2 '24 24 23 24  '$ GLUCOSE 129* 198* 121* 119*  BUN '14 13 14 15  '$ CALCIUM 8.5* 8.9 8.6* 8.7*  CREATININE 1.22 1.11 1.12 1.12  GFRNONAA >60 >60 >60 >60    LIVER FUNCTION TESTS: Recent Labs    04/12/22 1250 05/14/22 1406 07/01/22 0527 07/15/22 2158  BILITOT 0.4 0.5 0.9 0.5  AST 24 32 25 17  ALT 30 49* 32 43  ALKPHOS 90 100 73 97  PROT 7.4 6.9 6.9 6.4*  ALBUMIN 4.2 4.1 4.2 2.8*    Assessment and Plan:  Post-operative pelvic abscess 1 day s/p drainage catheter placement Draining well, 245cc purulent OP yesterday  Continue TID flushes with 5 cc NS. Record output Q shift. Dressing changes QD or PRN if soiled.   Call IR APP or on call IR MD if difficulty flushing or sudden change in drain output.   Repeat imaging/possible drain injection once output < 10 mL/QD (excluding flush material). Consideration for drain removal if output is < 10 mL/QD (excluding flush material), pending discussion with the providing surgical service.  Discharge planning: Please contact IR APP or on call IR MD prior to patient d/c to ensure appropriate follow up plans are  in place. Typically patient will follow up with IR clinic 10-14 days post d/c for repeat imaging/possible drain injection. IR scheduler will contact patient with date/time of appointment. Patient will need to flush drain QD with 5 cc NS, record output QD, dressing changes every 2-3 days or earlier if soiled.   IR will continue to follow - please call with questions or concerns.  Electronically Signed: Pasty Spillers, PA 07/17/2022, 11:27 AM   I spent a total of 15 Minutes at the the patient's bedside AND on the patient's hospital floor or unit, greater than 50% of which was counseling/coordinating care for pelvic abscess.

## 2022-07-18 NOTE — Progress Notes (Signed)
  Subjective  OR 07/01/22  Robotic-assisted ultra-low anterior resection with double stapled coloanal anastomosis   Pt with post op pelvic abscess.  S/p TG drain placement 7/21  Having pain at drain site, little better today  Objective: Vital signs in last 24 hours: Temp:  [98.1 F (36.7 C)-99.1 F (37.3 C)] 98.7 F (37.1 C) (07/23 0607) Pulse Rate:  [72-81] 72 (07/23 0607) Resp:  [16-18] 18 (07/23 0607) BP: (109-127)/(70-84) 109/74 (07/23 0607) SpO2:  [99 %-100 %] 100 % (07/23 0607) Last BM Date : 07/17/22  Intake/Output from previous day: 07/22 0701 - 07/23 0700 In: 1972.6 [P.O.:820; I.V.:1002.5; IV Piggyback:150] Out: 3880 [Urine:2720; Drains:110; IEPPI:9518] Intake/Output this shift: No intake/output data recorded.  Gen: NAD, comfortable Pulm: Normal work of breathing Abd: Soft, not significantly tender nor distended.  Ileostomy is pink. JP with purulent drainage Ext: SCDs in place  Lab Results: CBC  Recent Labs    07/15/22 2158  WBC 8.8  HGB 11.5*  HCT 34.4*  PLT 453*    BMET Recent Labs    07/15/22 2158  NA 136  K 4.5  CL 104  CO2 24  GLUCOSE 119*  BUN 15  CREATININE 1.12  CALCIUM 8.7*    PT/INR No results for input(s): "LABPROT", "INR" in the last 72 hours. ABG No results for input(s): "PHART", "HCO3" in the last 72 hours.  Invalid input(s): "PCO2", "PO2"  Studies/Results:  Anti-infectives: Anti-infectives (From admission, onward)    Start     Dose/Rate Route Frequency Ordered Stop   07/16/22 1000  piperacillin-tazobactam (ZOSYN) IVPB 3.375 g        3.375 g 12.5 mL/hr over 240 Minutes Intravenous Every 8 hours 07/16/22 0853     07/16/22 0900  Ampicillin-Sulbactam (UNASYN) 3 g in sodium chloride 0.9 % 100 mL IVPB  Status:  Discontinued        3 g 200 mL/hr over 30 Minutes Intravenous Every 6 hours 07/16/22 0218 07/16/22 0836   07/16/22 0215  Ampicillin-Sulbactam (UNASYN) 3 g in sodium chloride 0.9 % 100 mL IVPB        3 g 200 mL/hr  over 30 Minutes Intravenous  Once 07/16/22 0206 07/16/22 0731        Assessment/Plan: Patient Active Problem List   Diagnosis Date Noted   Abdominopelvic abscess ([place]) 07/16/2022   Intra-abdominal abscess ([place]) 07/16/2022   S/P laparoscopic-assisted sigmoidectomy 07/01/2022   Rectal cancer ([place]) 08/18/2021   Routine general medical examination at a health care facility 10/19/2011   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  -Cont reg diet, MIVF, IV abx  -await cx results - PO pain meds  - anticipate d/c tom   LOS: 1 day   Rosario Adie, MD  [facility] [place] [facility]

## 2022-07-19 ENCOUNTER — Other Ambulatory Visit: Payer: Self-pay

## 2022-07-19 ENCOUNTER — Other Ambulatory Visit (HOSPITAL_COMMUNITY): Payer: Self-pay

## 2022-07-19 MED ORDER — AMOXICILLIN-POT CLAVULANATE 875-125 MG PO TABS: 1.0000 | ORAL_TABLET | Freq: Two times a day (BID) | ORAL | Status: DC

## 2022-07-19 MED ORDER — AMOXICILLIN-POT CLAVULANATE 500-125 MG PO TABS
1.0000 | ORAL_TABLET | Freq: Three times a day (TID) | ORAL | 0 refills | Status: AC
  Filled 2022-07-19 (×2): qty 15, 5d supply, fill #0

## 2022-07-19 MED ORDER — SODIUM CHLORIDE 0.9% FLUSH: 5.0000 mL | Freq: Three times a day (TID) | INTRAVENOUS | Status: DC

## 2022-07-19 MED ORDER — OXYCODONE HCL 5 MG PO TABS
5.0000 mg | ORAL_TABLET | ORAL | 0 refills | Status: AC | PRN
Start: 2022-07-19 — End: 2022-07-24
  Filled 2022-07-19: qty 20, 4d supply, fill #0

## 2022-07-19 NOTE — Progress Notes (Signed)
Reviewed written d/c instructions w pt and his wife and all questions answered. They both verbalized understanding. Supplies given to pt for JP drain and demonstrated to both on how to empty, record, flush and site care. They verb good understanding. D/C via w/c w all belongings in stable condition.

## 2022-07-19 NOTE — Progress Notes (Addendum)
  Subjective  OR 07/01/22  Robotic-assisted ultra-low anterior resection with double stapled coloanal anastomosis   Pt with post op pelvic abscess.  S/p TG drain placement 7/21  Having mild pain at drain site, feeling well. No nausea, vomiting. Comfortable managing the drain  Objective: Vital signs in last 24 hours: Temp:  [98 F (36.7 C)-98.6 F (37 C)] 98 F (36.7 C) (07/24 0532) Pulse Rate:  [68-81] 68 (07/24 0532) Resp:  [18] 18 (07/24 0532) BP: (106-110)/(72-80) 106/80 (07/24 0532) SpO2:  [98 %-100 %] 100 % (07/24 0532) Last BM Date : 07/18/22  Intake/Output from previous day: 07/23 0701 - 07/24 0700 In: 1399.5 [P.O.:1010; I.V.:239.4; IV Piggyback:150.1] Out: 3060 [Urine:2100; Drains:35; Stool:925] Intake/Output this shift: No intake/output data recorded.  Gen: NAD, comfortable Pulm: Normal work of breathing Abd: Soft, not significantly tender nor distended.  Ileostomy is pink. JP with purulent drainage Ext: SCDs in place  Lab Results: CBC  No results for input(s): "WBC", "HGB", "HCT", "PLT" in the last 72 hours. BMET No results for input(s): "NA", "K", "CL", "CO2", "GLUCOSE", "BUN", "CREATININE", "CALCIUM" in the last 72 hours. PT/INR No results for input(s): "LABPROT", "INR" in the last 72 hours. ABG No results for input(s): "PHART", "HCO3" in the last 72 hours.  Invalid input(s): "PCO2", "PO2"  Studies/Results:  Anti-infectives: Anti-infectives (From admission, onward)    Start     Dose/Rate Route Frequency Ordered Stop   07/16/22 1000  piperacillin-tazobactam (ZOSYN) IVPB 3.375 g        3.375 g 12.5 mL/hr over 240 Minutes Intravenous Every 8 hours 07/16/22 0853     07/16/22 0900  Ampicillin-Sulbactam (UNASYN) 3 g in sodium chloride 0.9 % 100 mL IVPB  Status:  Discontinued        3 g 200 mL/hr over 30 Minutes Intravenous Every 6 hours 07/16/22 0218 07/16/22 0836   07/16/22 0215  Ampicillin-Sulbactam (UNASYN) 3 g in sodium chloride 0.9 % 100 mL IVPB         3 g 200 mL/hr over 30 Minutes Intravenous  Once 07/16/22 0206 07/16/22 0731        Assessment/Plan: Patient Active Problem List   Diagnosis Date Noted   Abdominopelvic abscess ([place]) 07/16/2022   Intra-abdominal abscess ([place]) 07/16/2022   S/P laparoscopic-assisted sigmoidectomy 07/01/2022   Rectal cancer ([place]) 08/18/2021   Routine general medical examination at a health care facility 10/19/2011   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 Bryan [name] is a very pleasant 53yoM hx of rectal cancer, TNT now s/p robotic LAR/DLI 07/01/22 whom has developed a probable anastomotic leak from his ultra-low anterior resection anastomosis.  - Diet as tolerated - Augmentin to complete 14 d course - Drain care has been provided with nursing, he expressed understnading of plan, will make sure he can demonstrate appropriate technique - Out pt follow-up with me and with IR - will need drain study  - If all is good from drain care perspective, he is comfortable with and stable for discharge home   LOS: 2 days   Nadeen Landau, MD [facility] Surgery, [place]

## 2022-07-19 NOTE — Discharge Instructions (Addendum)
POST OP INSTRUCTIONS AFTER COLON SURGERY  DIET: Be sure to include lots of fluids daily to stay hydrated - 64oz of water per day (8, 8 oz glasses).  Avoid fast food or heavy meals for the first couple of weeks as your are more likely to get nauseated. Avoid raw/uncooked fruits or vegetables for the first 4 weeks (its ok to have these if they are blended into smoothie form). If you have fruits/vegetables, make sure they are cooked until soft enough to mash on the roof of your mouth and chew your food well. Otherwise, diet as tolerated.  Take your usually prescribed home medications unless otherwise directed.  PAIN CONTROL: Pain is best controlled by a usual combination of three different methods TOGETHER: Ice/Heat Over the counter pain medication Prescription pain medication Most patients will experience some swelling and bruising around the surgical site.  Ice packs or heating pads (30-60 minutes up to 6 times a day) will help. Some people prefer to use ice alone, heat alone, alternating between ice & heat.  Experiment to what works for you.  Swelling and bruising can take several weeks to resolve.   It is helpful to take an over-the-counter pain medication regularly for the first few weeks: Ibuprofen (Motrin/Advil) - '200mg'$  tabs - take 3 tabs ('600mg'$ ) every 6 hours as needed for pain (unless you have been directed previously to avoid NSAIDs/ibuprofen) Acetaminophen (Tylenol) - you may take '650mg'$  every 6 hours as needed. You can take this with motrin as they act differently on the body. If you are taking a narcotic pain medication that has acetaminophen in it, do not take over the counter tylenol at the same time. NOTE: You may take both of these medications together - most patients  find it most helpful when alternating between the two (i.e. Ibuprofen at 6am, tylenol at 9am, ibuprofen at 12pm ..Marland Kitchen) A  prescription for pain medication should be given to you upon discharge.  Take your pain medication as  prescribed if your pain is not adequatly controlled with the over-the-counter pain reliefs mentioned above.  Avoid getting constipated.  Between the surgery and the pain medications, it is common to experience some constipation.  Increasing fluid intake and taking a fiber supplement (such as Metamucil, Citrucel, FiberCon, MiraLax, etc) 1-2 times a day regularly will usually help prevent this problem from occurring.  A mild laxative (prune juice, Milk of Magnesia, MiraLax, etc) should be taken according to package directions if there are no bowel movements after 48 hours.    Drain care: empty and record output from the drain twice daily, record in mL (milli liters if possible) and the color (clear, tan, milky white, [place], red, etc). Bring this record to all of our follow-up appointments. Flush drain as instructed by interventional radiology.  ACTIVITIES as tolerated:   Avoid heavy lifting (>10lbs or 1 gallon of milk) for the next 6 weeks. You may resume regular daily activities as tolerated--such as daily self-care, walking, climbing stairs--gradually increasing activities as tolerated.  If you can walk 30 minutes without difficulty, it is safe to try more intense activity such as jogging, treadmill, bicycling, low-impact aerobics.  DO NOT PUSH THROUGH PAIN.  Let pain be your guide: If it hurts to do something, don't do it. You may drive when you are no longer taking prescription pain medication, you can comfortably wear a seatbelt, and you can safely maneuver your car and apply brakes.  FOLLOW UP in our office Please call [facility] at [phone]  to set up an appointment to see your surgeon in the office for a follow-up appointment approximately 2 weeks after your surgery. Make sure that you call for this appointment the day you arrive home to insure a convenient appointment time.  9. If you have disability or family leave forms that need to be completed, you may have them completed by your primary  care physician's office; for return to work instructions, please ask our office staff and they will be happy to assist you in obtaining this documentation   When to call us [phone]: Poor pain control Reactions / problems with new medications (rash/itching, etc)  Fever over 101.5 F (38.5 C) Inability to urinate Nausea/vomiting Worsening swelling or bruising Continued bleeding from incision. Increased pain, redness, or drainage from the incision  The clinic staff is available to answer your questions during regular business hours (8:30am-5pm).  Please don't hesitate to call and ask to speak to one of our nurses for clinical concerns.   A surgeon from [facility] is always on call at the hospitals   If you have a medical emergency, go to the nearest emergency room or call 911.  [facility], [place] [address], [place], [place], [place]  [address] MAIN: [phone] FAX: [phone] [email or web address]       Flush pelvic drain once daily with 5 cc sterile saline, record output of drain and change bandage every 2-3 days; call [phone] with any drain related questions

## 2022-07-19 NOTE — Progress Notes (Signed)
Referring Physician(s): White,C  Supervising Physician: Corrie Mckusick  Patient Status:  [facility] - In-pt  Chief Complaint: Pelvic abscess   Subjective: Pt awaiting dc home today with pelvic drain; has some soreness at drain site; denies fever, N/V; did report some drainage of non bloody fluid from rectum today   Allergies: Patient has no known allergies.  Medications: Prior to Admission medications   Medication Sig Start Date End Date Taking? Authorizing Provider  acetaminophen (TYLENOL) 500 MG tablet Take 1,000 mg by mouth every 6 (six) hours as needed for moderate pain.   Yes [provider]  amoxicillin-clavulanate (AUGMENTIN) 500-125 MG tablet Take 1 tablet (500 mg total) by mouth 3 (three) times daily for 10 days. 07/19/22 07/29/22 Yes Ileana Roup, MD  Cyanocobalamin (B-12 PO) Take 1 tablet by mouth daily.   Yes [provider]  ibuprofen (ADVIL) 800 MG tablet Take 1 tablet (800 mg total) by mouth 2 (two) times daily as needed for moderate pain. Take with food. Follow up with PCP for refills 02/05/22  Yes Truitt Merle, MD  Sodium Chloride Flush (NORMAL SALINE FLUSH) 0.9 % SOLN Inject 5 mLs by Intracatheter route daily. 07/17/22 08/16/22 Yes Boisseau, Hayley, PA  loperamide (IMODIUM) 2 MG capsule Take 1 capsule (2 mg total) by mouth 2 (two) times daily. Patient taking differently: Take 2 mg by mouth as needed for diarrhea or loose stools. 07/07/22 09/05/22  Ileana Roup, MD  oxyCODONE (OXY IR/ROXICODONE) 5 MG immediate release tablet Take 1 tablet (5 mg total) by mouth every 4 (four) hours as needed for up to 5 days for severe pain. 07/19/22 07/24/22  Ileana Roup, MD     Vital Signs: BP 106/80 (BP Location: Right Arm)   Pulse 68   Temp 98 F (36.7 C) (Oral)   Resp 18   Ht '5\' 10"'$  (1.778 m)   Wt 182 lb 15.7 oz (83 kg)   SpO2 100%   BMI 26.26 kg/m   Physical Exam awake/alert; TG/pelvic drain intact, dressing dry, site mildly tender; OP 35  cc hazy, light yellow fluid  Imaging: CT IMAGE GUIDED FLUID DRAIN BY CATHETER  Result Date: 07/16/2022 INDICATION: 51 year old male status post rectal mass excision with perirectal pelvic abscess formation. EXAM: CT IMAGE GUIDED FLUID DRAIN BY CATHETER COMPARISON:  07/16/2022 MEDICATIONS: The patient is currently admitted to the hospital and receiving intravenous antibiotics. The antibiotics were administered within an appropriate time frame prior to the initiation of the procedure. ANESTHESIA/SEDATION: Moderate (conscious) sedation was employed during this procedure. A total of Versed 2 mg and Fentanyl 100 mcg was administered intravenously. Moderate Sedation Time: 14 minutes. The patient's level of consciousness and vital signs were monitored continuously by radiology nursing throughout the procedure under my direct supervision. CONTRAST:  None COMPLICATIONS: None immediate. PROCEDURE: RADIATION DOSE REDUCTION: This exam was performed according to the departmental dose-optimization program which includes automated exposure control, adjustment of the mA and/or kV according to patient size and/or use of iterative reconstruction technique. Informed written consent was obtained from the patient after a discussion of the risks, benefits and alternatives to treatment. The patient was placed prone on the CT gantry and a pre procedural CT was performed re-demonstrating the known abscess/fluid collection within the lower pelvis. The procedure was planned. A timeout was performed prior to the initiation of the procedure. The left gluteal region was prepped and draped in the usual sterile fashion. The overlying soft tissues were anesthetized with 1% lidocaine with epinephrine. Appropriate  trajectory was planned with the use of a 22 gauge spinal needle. An 18 gauge trocar needle was advanced into the abscess/fluid collection and a short Amplatz super stiff wire was coiled within the collection. Appropriate positioning  was confirmed with a limited CT scan. The tract was serially dilated allowing placement of a 10.2 [place] all-purpose drainage catheter. Appropriate positioning was confirmed with a limited postprocedural CT scan. Proximally 150 ml of purulent fluid was aspirated. The tube was connected to a bulb suction and sutured in place. A dressing was placed. The patient tolerated the procedure well without immediate post procedural complication. IMPRESSION: Successful CT guided transgluteal placement of a 10.2 [place] all purpose drain catheter into the perirectal fluid collection with aspiration of 150 mL of purulent fluid. Samples were sent to the laboratory as requested by the ordering clinical team. Ruthann Cancer, MD [facility] Electronically Signed   By: Ruthann Cancer M.D.   On: 07/16/2022 16:31   CT ABDOMEN PELVIS W CONTRAST  Result Date: 07/16/2022 CLINICAL DATA:  Postop abdominal pain status post rectal mass excision EXAM: CT ABDOMEN AND PELVIS WITH CONTRAST TECHNIQUE: Multidetector CT imaging of the abdomen and pelvis was performed using the standard protocol following bolus administration of intravenous contrast. RADIATION DOSE REDUCTION: This exam was performed according to the departmental dose-optimization program which includes automated exposure control, adjustment of the mA and/or kV according to patient size and/or use of iterative reconstruction technique. CONTRAST:  177m OMNIPAQUE IOHEXOL 300 MG/ML  SOLN COMPARISON:  MR pelvis dated 06/17/2022. Outside hospital ([facility] CT abdomen/pelvis dated 02/18/2022. FINDINGS: Lower chest: Lung bases are clear. Hepatobiliary: Mildly heterogeneous hepatic perfusion which normalizes on delayed imaging. Gallbladder is unremarkable. No intrahepatic or extrahepatic duct dilatation. Pancreas: Within normal limits. Spleen: Within normal limits. Adrenals/Urinary Tract: Adrenal glands are within normal limits.  Bilateral renal sinus cysts, without hydronephrosis on delayed imaging. Kidneys are otherwise within normal limits. Bladder is mildly thick-walled although underdistended. Stomach/Bowel: Stomach is within normal limits. No evidence of bowel obstruction. Status post low anterior resection with diverting ileostomy and suture line in the lower pelvis. Vascular/Lymphatic: No evidence of abdominal aortic aneurysm. No suspicious abdominopelvic lymphadenopathy. Reproductive: Prostate is unremarkable. Other: Pelvic fluid and gas collection, measuring 21.1 cm (AP) x 6.3 cm (craniocaudal) x 4.7 cm (transverse) on sagittal image 109 and axial image 68. Associated mild to moderate free air, predominantly beneath the anterior abdominal wall (series 2/image 26). Musculoskeletal: Visualized osseous structures are within normal limits. IMPRESSION: Status post low anterior resection with diverting ileostomy. Moderate pelvic fluid and gas collection, as described above, compatible with postoperative abscess. Associated mild to moderate free air, predominantly beneath the anterior abdominal wall. In the setting of known recent surgical drain (reportedly removed yesterday), this finding may be related to the drain, but is more than would be expected. As such, a perforated viscus (specifically disruption at the surgical anastomosis) is not excluded. These results were called by telephone at the time of interpretation on 07/16/2022 at 1:32 am [facility] who verbally acknowledged these results. Electronically Signed   By: SJulian HyM.D.   On: 07/16/2022 01:36    Labs:  CBC: Recent Labs    07/01/22 0527 07/02/22 0508 07/03/22 0531 07/15/22 2158  WBC 3.7* 6.9 8.4 8.8  HGB 13.6 12.1* 13.7 11.5*  HCT 40.9 36.7* 41.2 34.4*  PLT 199 171 182 453*    COAGS: No results for input(s): "INR", "APTT" in the last 8760 hours.  BMP: Recent Labs    07/02/22 0508 07/03/22 0531 07/04/22 0447 07/15/22 2158   NA 136 133* 133* 136  K 4.2 4.8 4.3 4.5  CL 105 101 102 104  CO2 '24 24 23 24  '$ GLUCOSE 129* 198* 121* 119*  BUN '14 13 14 15  '$ CALCIUM 8.5* 8.9 8.6* 8.7*  CREATININE 1.22 1.11 1.12 1.12  GFRNONAA >60 >60 >60 >60    LIVER FUNCTION TESTS: Recent Labs    04/12/22 1250 05/14/22 1406 07/01/22 0527 07/15/22 2158  BILITOT 0.4 0.5 0.9 0.5  AST 24 32 25 17  ALT 30 49* 32 43  ALKPHOS 90 100 73 97  PROT 7.4 6.9 6.9 6.4*  ALBUMIN 4.2 4.1 4.2 2.8*    Assessment and Plan: Mr. Bonser is a very pleasant 65yoM hx of rectal cancer, TNT now s/p robotic LAR/DLI 07/01/22 whom has developed a probable anastomotic leak from his ultra-low anterior resection anastomosis; s/p drainage of perirectal abscess on 07/16/22 (10 fr to JP); afebrile; no new labs; drain fl cx-enterococcus/strept/actinomyces; pt for dc home today with drain; drain care instructions reviewed with pt; pt/spouse aware of how to flush drain once daily with 5 cc sterile saline; prescription for saline flushes given to pt; OP IR drain clinic f/u will be scheduled and pt contacted with date/time   Electronically Signed: D. Rowe Robert, PA-C 07/19/2022, 2:23 PM   I spent a total of 15 Minutes at the the patient's bedside AND on the patient's hospital floor or unit, greater than 50% of which was counseling/coordinating care for pelvic/perirectal abscess drain    Patient ID: Bryan [name], male   DOB: [birth year], 51 y.o.   MRN: [id number]

## 2022-07-19 NOTE — Discharge Summary (Signed)
Patient ID: Bryan [name] MRN: [id number] DOB/AGE: Jul 08, 1971 51 y.o.  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 Whole picture seems most consistent with anastomotic leak which was partially controlled with ileostomy previously fashioned. Clinically improved significantly following IR drain placement. Drain care and teaching completed. Diet advanced and tolerated. On 07/19/22 he demonstrated appropriate drain care techniques, was instructed on drain management and keeping track of volume and character. He is comfortable with and stable for discharge home. Follow-up in my office and with IR arranged.   Allergies as of 07/19/2022   No Known Allergies      Medication List     TAKE these medications    acetaminophen 500 MG tablet Commonly known as: TYLENOL Take 1,000 mg by mouth every 6 (six) hours as needed for moderate pain.   amoxicillin-clavulanate 500-125 MG tablet Commonly known as: Augmentin Take 1 tablet (500 mg total) by mouth 3 (three) times daily for 10 days.   B-12 PO Take 1 tablet by mouth daily.   ibuprofen 800 MG tablet Commonly known as: ADVIL Take 1 tablet (800 mg total) by mouth 2 (two) times daily as needed for moderate pain. Take with food. Follow up with PCP for refills   loperamide 2 MG  capsule Commonly known as: IMODIUM Take 1 capsule (2 mg total) by mouth 2 (two) times daily. What changed:  when to take this reasons to take this   Normal Saline Flush 0.9 % Soln Inject 5 mLs by Intracatheter route daily.   oxyCODONE 5 MG immediate release tablet Commonly known as: Oxy IR/ROXICODONE Take 1 tablet (5 mg total) by mouth every 4 (four) hours as needed for up to 5 days for severe pain.          Follow-up Information     Ileana Roup, MD Follow up in 2 week(s).   Specialties: General Surgery, Colon and Rectal Surgery Contact information: [address] [place] [address] [phone]         [place]. Schedule an appointment as soon as possible for a visit.   Contact information: [place] [address] [phone]                 Bengie Kaucher M. Dema Severin, M.D. [facility]

## 2022-07-20 ENCOUNTER — Other Ambulatory Visit (HOSPITAL_COMMUNITY): Payer: Self-pay

## 2022-07-20 LAB — AEROBIC/ANAEROBIC CULTURE W GRAM STAIN (SURGICAL/DEEP WOUND)

## 2022-07-21 ENCOUNTER — Telehealth: Payer: Self-pay

## 2022-07-21 ENCOUNTER — Other Ambulatory Visit: Payer: Self-pay | Admitting: Surgery

## 2022-07-21 DIAGNOSIS — K651 Peritoneal abscess: Secondary | ICD-10-CM

## 2022-07-21 NOTE — Telephone Encounter (Signed)
I called pt. Per pt. Ping report and spoke with his wife. She stated he is still recovering from his surgery and has already scheduled follow up apts. With several other doctors. Per pt. Chart he was to f/u with surgery with in 2 weeks.

## 2022-07-22 ENCOUNTER — Other Ambulatory Visit: Payer: Self-pay | Admitting: Hematology

## 2022-07-22 DIAGNOSIS — K6289 Other specified diseases of anus and rectum: Secondary | ICD-10-CM

## 2022-07-23 ENCOUNTER — Encounter: Payer: Self-pay | Admitting: Hematology

## 2022-07-23 ENCOUNTER — Inpatient Hospital Stay: Payer: BC Managed Care – PPO | Admitting: Hematology

## 2022-07-26 ENCOUNTER — Other Ambulatory Visit (HOSPITAL_COMMUNITY): Payer: Self-pay

## 2022-08-02 ENCOUNTER — Other Ambulatory Visit: Payer: BC Managed Care – PPO

## 2022-08-03 ENCOUNTER — Ambulatory Visit
Admission: RE | Admit: 2022-08-03 | Discharge: 2022-08-03 | Disposition: A | Discharge status: Home / Self Care | Payer: BC Managed Care – PPO | Source: Ambulatory Visit | Attending: Surgery | Admitting: Surgery

## 2022-08-03 ENCOUNTER — Other Ambulatory Visit (HOSPITAL_COMMUNITY): Payer: Self-pay

## 2022-08-03 ENCOUNTER — Other Ambulatory Visit: Payer: Self-pay | Admitting: Surgery

## 2022-08-03 ENCOUNTER — Ambulatory Visit
Admission: RE | Admit: 2022-08-03 | Discharge: 2022-08-03 | Disposition: A | Discharge status: Home / Self Care | Payer: BC Managed Care – PPO | Source: Ambulatory Visit | Attending: Physician Assistant | Admitting: Physician Assistant

## 2022-08-03 DIAGNOSIS — K651 Peritoneal abscess: Secondary | ICD-10-CM

## 2022-08-03 HISTORY — PX: IR RADIOLOGIST EVAL & MGMT: IMG5224

## 2022-08-03 MED ORDER — IOPAMIDOL (ISOVUE-300) INJECTION 61%
100.0000 mL | Freq: Once | INTRAVENOUS | Status: AC | PRN
  Administered 2022-08-03: 100 mL via INTRAVENOUS

## 2022-08-03 NOTE — Progress Notes (Signed)
Chief Complaint: Patient was seen in consultation today for postoperative intra-abdominal abscess at the request of [facility]  Referring Physician(s): Nadeen Landau  History of Present Illness: Bryan [name] is a 51 y.o. male With a history of rectal cancer status post radiation therapy followed by robotic assisted low anterior resection with stapled coloanal anastomosis.  Postoperative course was complicated by anastomotic leak and a subsequent intra-abdominal abscess.  Interventional radiology placed a percutaneous drain on 07/16/2022 via a left transgluteal approach.  Patient now presents today for follow-up.  He has been tolerating the drainage catheter well.  Output is approximately 40 mL of turbid peach colored fluid.  He is flushing his drainage catheter once daily with 5 mL saline.  Past Medical History:  Diagnosis Date   Allergic rhinitis, cause unspecified    spring only   Hypercholesteremia    Rectal cancer ([place]) 09/14/2021    Past Surgical History:  Procedure Laterality Date   DIVERTING ILEOSTOMY N/A 07/01/2022   Procedure: DIVERTING LOOP ILEOSTOMY;  Surgeon: Ileana Roup, MD;  Location: [facility];  Service: General;  Laterality: N/A;   FLEXIBLE SIGMOIDOSCOPY N/A 07/01/2022   Procedure: FLEXIBLE SIGMOIDOSCOPY;  Surgeon: Ileana Roup, MD;  Location: [facility];  Service: General;  Laterality: N/A;   IR RADIOLOGIST EVAL & MGMT  08/03/2022   None     XI ROBOTIC ASSISTED LOWER ANTERIOR RESECTION N/A 07/01/2022   Procedure: XI ROBOTIC ASSISTED LOWER ANTERIOR RESECTION STAPLED COLOANAL ANASTAMOSIS INTRAOP ASSESSMENT OF PERFUSION AND TAPP BLOCKS;  Surgeon: Ileana Roup, MD;  Location: [facility];  Service: General;  Laterality: N/A;    Allergies: Patient has no known allergies.  Medications: Prior to Admission medications   Medication Sig Start Date End Date Taking? Authorizing Provider  acetaminophen (TYLENOL) 500 MG tablet Take 1,000 mg by mouth every 6  (six) hours as needed for moderate pain.    [provider]  Cyanocobalamin (B-12 PO) Take 1 tablet by mouth daily.    [provider]  ibuprofen (ADVIL) 800 MG tablet Take 1 tablet (800 mg total) by mouth 2 (two) times daily as needed for moderate pain. Take with food. Follow up with PCP for refills 02/05/22   Truitt Merle, MD  loperamide (IMODIUM) 2 MG capsule Take 1 capsule (2 mg total) by mouth 2 (two) times daily. Patient taking differently: Take 2 mg by mouth as needed for diarrhea or loose stools. 07/07/22 09/05/22  Ileana Roup, MD  Sodium Chloride Flush (NORMAL SALINE FLUSH) 0.9 % SOLN Inject 5 mLs by Intracatheter route daily. 07/17/22 08/16/22  Pasty Spillers, PA     Family History  Problem Relation Age of Onset   Cancer Mother 84       Breast   Alcohol abuse Father    Cancer Paternal Aunt        breast   Breast cancer Paternal Aunt    Diabetes Paternal Aunt    Heart disease Paternal Aunt    Breast cancer Cousin    Cancer Paternal Great-grandfather        prostate   Prostate cancer Paternal Great-grandfather    Hypertension Neg Hx    Colon polyps Neg Hx    Esophageal cancer Neg Hx    Rectal cancer Neg Hx    Stomach cancer Neg Hx     Social History   Socioeconomic History   Marital status: Married    Spouse name: Not on file   Number of children: 3   Years  of education: Not on file   Highest education level: Not on file  Occupational History   Occupation: Driver/yard work    Fish farm manager: [employer]    Comment: nights  Tobacco Use   Smoking status: Never   Smokeless tobacco: Never  Vaping Use   Vaping Use: Never used  Substance and Sexual Activity   Alcohol use: No   Drug use: No   Sexual activity: Not on file  Other Topics Concern   Not on file  Social History Narrative   3 children--ages 69, 66, 21 (as of 10/14/21)   Social Determinants of Health   Financial Resource Strain: Not on file  Food Insecurity: Not on file  Transportation  Needs: Not on file  Physical Activity: Not on file  Stress: Not on file  Social Connections: Not on file    Review of Systems: A 12 point ROS discussed and pertinent positives are indicated in the HPI above.  All other systems are negative.  Review of Systems  Vital Signs: There were no vitals taken for this visit.    Physical Exam Constitutional:      Appearance: Normal appearance. He is not toxic-appearing.  HENT:     Head: Normocephalic and atraumatic.  Eyes:     General: No scleral icterus. Cardiovascular:     Rate and Rhythm: Normal rate.  Pulmonary:     Effort: Pulmonary effort is normal.  Abdominal:     General: Abdomen is flat. There is no distension.     Tenderness: There is no abdominal tenderness. There is no guarding.  Skin:      Neurological:     Mental Status: He is alert and oriented to person, place, and time.  Psychiatric:        Mood and Affect: Mood normal.        Behavior: Behavior normal.       Imaging: DG Sinus/Fist Tube Chk-Non GI  Result Date: 08/03/2022 INDICATION: 80 male with a history of rectal cancer status post robotic assisted low anterior resection with stapled colo-anal anastomosis. Postoperative abscess required placement of a percutaneous drainage catheter on 07/16/2022. EXAM: Drain injection MEDICATIONS: The patient is currently admitted to the hospital and receiving intravenous antibiotics. The antibiotics were administered within an appropriate time frame prior to the initiation of the procedure. ANESTHESIA/SEDATION: None. COMPLICATIONS: None immediate. PROCEDURE: Informed written consent was obtained from the patient after a thorough discussion of the procedural risks, benefits and alternatives. All questions were addressed. Maximal Sterile Barrier Technique was utilized including caps, mask, sterile gowns, sterile gloves, sterile drape, hand hygiene and skin antiseptic. A timeout was performed prior to the initiation of the  procedure. Initial spot imaging demonstrates a well-positioned left transgluteal approach drainage catheter. Contrast material is present within the urinary bladder. A gentle hand injection of contrast material opacifies a collapsed abscess cavity. There is fistulous communication with the adjacent colo anal anastomosis. IMPRESSION: 1. Positive for fistulous communication between the drainage catheter in the adjacent colo enteric anastomosis. 2. Resolved abscess. Electronically Signed   By: Jacqulynn Cadet M.D.   On: 08/03/2022 14:08   IR Radiologist Eval & Mgmt  Result Date: 08/03/2022 EXAM: NEW PATIENT OFFICE VISIT CHIEF COMPLAINT: SEE EPIC NOTE HISTORY OF PRESENT ILLNESS: SEE EPIC NOTE REVIEW OF SYSTEMS: SEE EPIC NOTE PHYSICAL EXAMINATION: SEE EPIC NOTE ASSESSMENT AND PLAN: SEE EPIC NOTE Electronically Signed   By: Jacqulynn Cadet M.D.   On: 08/03/2022 14:05   CT ABDOMEN PELVIS W CONTRAST  Result  Date: 08/03/2022 CLINICAL DATA:  51 year old male with a history of rectal cancer status post radiation therapy and robotic assisted lower anterior resection and diverting ileostomy. Surgical course complicated by postoperative abscess necessitating percutaneous drain placement on 07/16/2022. Patient presents today for initial follow-up evaluation. EXAM: CT ABDOMEN AND PELVIS WITH CONTRAST TECHNIQUE: Multidetector CT imaging of the abdomen and pelvis was performed using the standard protocol following bolus administration of intravenous contrast. RADIATION DOSE REDUCTION: This exam was performed according to the departmental dose-optimization program which includes automated exposure control, adjustment of the mA and/or kV according to patient size and/or use of iterative reconstruction technique. CONTRAST:  177m ISOVUE-300 IOPAMIDOL (ISOVUE-300) INJECTION 61% COMPARISON:  Prior CT scan of the abdomen and pelvis 07/16/2022; CT scan of the chest 09/17/2021 FINDINGS: Lower chest: Stable 9 mm subpleural lymph  node along the left major fissure 1 compared to prior imaging from September of 2022. Otherwise, no acute abnormality. Hepatobiliary: Stable tiny low-attenuation lesions in hepatic segment 5 and 3. While too small to characterize, stability over time suggests small cysts. Gallbladder is unremarkable. No intra or extrahepatic biliary ductal dilatation. Pancreas: Unremarkable. No pancreatic ductal dilatation or surrounding inflammatory changes. Spleen: No splenic injury or perisplenic hematoma. Adrenals/Urinary Tract: Normal adrenal glands. No hydronephrosis or ureterectasis. No enhancing renal mass. Bilateral parapelvic renal sinus cysts. Bladder is decompressed. Stomach/Bowel: Surgical changes of low anterior resection with colo-anal anastomosis and right lower quadrant diverting ileostomy. No evidence of bowel obstruction. Vascular/Lymphatic: No significant vascular findings are present. No enlarged abdominal or pelvic lymph nodes. Reproductive: Prostate is unremarkable. Other: Left transgluteal approach percutaneous drainage catheter in place. Minimal residual presacral thickening/fluid. The large fluid and gas collection has essentially resolved. No undrained abscess cavity visual within the abdomen. Mild mesenteric edema. Musculoskeletal: No acute or significant osseous findings. IMPRESSION: 1. Significant interval improvement with resolution of the previously detected intra-abdominal abscess cavity. 2. Drainage catheter remains in good position with some mild residual presacral edema/fluid. 3. Additional ancillary findings as above. Electronically Signed   By: HJacqulynn CadetM.D.   On: 08/03/2022 13:56   CT IMAGE GUIDED FLUID DRAIN BY CATHETER  Result Date: 07/16/2022 INDICATION: 51year old male status post rectal mass excision with perirectal pelvic abscess formation. EXAM: CT IMAGE GUIDED FLUID DRAIN BY CATHETER COMPARISON:  07/16/2022 MEDICATIONS: The patient is currently admitted to the hospital and  receiving intravenous antibiotics. The antibiotics were administered within an appropriate time frame prior to the initiation of the procedure. ANESTHESIA/SEDATION: Moderate (conscious) sedation was employed during this procedure. A total of Versed 2 mg and Fentanyl 100 mcg was administered intravenously. Moderate Sedation Time: 14 minutes. The patient's level of consciousness and vital signs were monitored continuously by radiology nursing throughout the procedure under my direct supervision. CONTRAST:  None COMPLICATIONS: None immediate. PROCEDURE: RADIATION DOSE REDUCTION: This exam was performed according to the departmental dose-optimization program which includes automated exposure control, adjustment of the mA and/or kV according to patient size and/or use of iterative reconstruction technique. Informed written consent was obtained from the patient after a discussion of the risks, benefits and alternatives to treatment. The patient was placed prone on the CT gantry and a pre procedural CT was performed re-demonstrating the known abscess/fluid collection within the lower pelvis. The procedure was planned. A timeout was performed prior to the initiation of the procedure. The left gluteal region was prepped and draped in the usual sterile fashion. The overlying soft tissues were anesthetized with 1% lidocaine with epinephrine. Appropriate trajectory was planned with  the use of a 22 gauge spinal needle. An 18 gauge trocar needle was advanced into the abscess/fluid collection and a short Amplatz super stiff wire was coiled within the collection. Appropriate positioning was confirmed with a limited CT scan. The tract was serially dilated allowing placement of a 10.2 [place] all-purpose drainage catheter. Appropriate positioning was confirmed with a limited postprocedural CT scan. Proximally 150 ml of purulent fluid was aspirated. The tube was connected to a bulb suction and sutured in place. A dressing was placed.  The patient tolerated the procedure well without immediate post procedural complication. IMPRESSION: Successful CT guided transgluteal placement of a 10.2 [place] all purpose drain catheter into the perirectal fluid collection with aspiration of 150 mL of purulent fluid. Samples were sent to the laboratory as requested by the ordering clinical team. Ruthann Cancer, MD [facility] Electronically Signed   By: Ruthann Cancer M.D.   On: 07/16/2022 16:31   CT ABDOMEN PELVIS W CONTRAST  Result Date: 07/16/2022 CLINICAL DATA:  Postop abdominal pain status post rectal mass excision EXAM: CT ABDOMEN AND PELVIS WITH CONTRAST TECHNIQUE: Multidetector CT imaging of the abdomen and pelvis was performed using the standard protocol following bolus administration of intravenous contrast. RADIATION DOSE REDUCTION: This exam was performed according to the departmental dose-optimization program which includes automated exposure control, adjustment of the mA and/or kV according to patient size and/or use of iterative reconstruction technique. CONTRAST:  132m OMNIPAQUE IOHEXOL 300 MG/ML  SOLN COMPARISON:  MR pelvis dated 06/17/2022. Outside hospital ([facility] CT abdomen/pelvis dated 02/18/2022. FINDINGS: Lower chest: Lung bases are clear. Hepatobiliary: Mildly heterogeneous hepatic perfusion which normalizes on delayed imaging. Gallbladder is unremarkable. No intrahepatic or extrahepatic duct dilatation. Pancreas: Within normal limits. Spleen: Within normal limits. Adrenals/Urinary Tract: Adrenal glands are within normal limits. Bilateral renal sinus cysts, without hydronephrosis on delayed imaging. Kidneys are otherwise within normal limits. Bladder is mildly thick-walled although underdistended. Stomach/Bowel: Stomach is within normal limits. No evidence of bowel obstruction. Status post low anterior resection with diverting ileostomy and suture line in the lower pelvis.  Vascular/Lymphatic: No evidence of abdominal aortic aneurysm. No suspicious abdominopelvic lymphadenopathy. Reproductive: Prostate is unremarkable. Other: Pelvic fluid and gas collection, measuring 21.1 cm (AP) x 6.3 cm (craniocaudal) x 4.7 cm (transverse) on sagittal image 109 and axial image 68. Associated mild to moderate free air, predominantly beneath the anterior abdominal wall (series 2/image 26). Musculoskeletal: Visualized osseous structures are within normal limits. IMPRESSION: Status post low anterior resection with diverting ileostomy. Moderate pelvic fluid and gas collection, as described above, compatible with postoperative abscess. Associated mild to moderate free air, predominantly beneath the anterior abdominal wall. In the setting of known recent surgical drain (reportedly removed yesterday), this finding may be related to the drain, but is more than would be expected. As such, a perforated viscus (specifically disruption at the surgical anastomosis) is not excluded. These results were called by telephone at the time of interpretation on 07/16/2022 at 1:32 am [facility] verbally acknowledged these results. Electronically Signed   By: SJulian HyM.D.   On: 07/16/2022 01:36    Labs:  CBC: Recent Labs    07/01/22 0527 07/02/22 0508 07/03/22 0531 07/15/22 2158  WBC 3.7* 6.9 8.4 8.8  HGB 13.6 12.1* 13.7 11.5*  HCT 40.9 36.7* 41.2 34.4*  PLT 199 171 182 453*    COAGS: No results for input(s): "INR", "APTT" in the last 8760 hours.  BMP: Recent Labs  07/02/22 0508 07/03/22 0531 07/04/22 0447 07/15/22 2158  NA 136 133* 133* 136  K 4.2 4.8 4.3 4.5  CL 105 101 102 104  CO2 '24 24 23 24  '$ GLUCOSE 129* 198* 121* 119*  BUN '14 13 14 15  '$ CALCIUM 8.5* 8.9 8.6* 8.7*  CREATININE 1.22 1.11 1.12 1.12  GFRNONAA >60 >60 >60 >60    LIVER FUNCTION TESTS: Recent Labs    04/12/22 1250 05/14/22 1406 07/01/22 0527 07/15/22 2158  BILITOT 0.4 0.5 0.9 0.5   AST 24 32 25 17  ALT 30 49* 32 43  ALKPHOS 90 100 73 97  PROT 7.4 6.9 6.9 6.4*  ALBUMIN 4.2 4.1 4.2 2.8*    TUMOR MARKERS: Recent Labs    09/14/21 1654  CEA 3.9*    Assessment and Plan:  Pleasant 51 year old gentleman with a history of postoperative abscess following robotic assisted low anterior resection with stapled colorectal anal anastomosis.  While his abscess has resolved by imaging and clinical presentation, he continues to have relatively high output of approximately 40 mL turbid peach colored fluid daily.  Contrast injection identifies a fistulous communication between the drainage catheter and the adjacent coloanal anastomosis.  1.)  Switched from JP bulb suction to gravity bag for drainage. 2.)  Return to clinic in 2 weeks for repeat drain injection.  No CT imaging needed. 3.)  Continue once daily flushing and monitor drain output.    Electronically Signed: Criselda Peaches 08/03/2022, 2:11 PM   I spent a total of 15 Minutes in face to face in clinical consultation, greater than 50% of which was counseling/coordinating care for post operative abscess with drain in place.

## 2022-08-04 ENCOUNTER — Other Ambulatory Visit (HOSPITAL_COMMUNITY): Payer: Self-pay

## 2022-08-04 MED ORDER — NORMAL SALINE FLUSH 0.9 % IV SOLN
INTRAVENOUS | 0 refills | Status: DC
  Filled 2022-08-04 (×2): qty 100, 10d supply, fill #0

## 2022-08-08 ENCOUNTER — Other Ambulatory Visit: Payer: Self-pay | Admitting: Hematology

## 2022-08-08 DIAGNOSIS — K6289 Other specified diseases of anus and rectum: Secondary | ICD-10-CM

## 2022-08-09 ENCOUNTER — Other Ambulatory Visit (HOSPITAL_COMMUNITY): Payer: Self-pay

## 2022-08-17 ENCOUNTER — Ambulatory Visit
Admission: RE | Admit: 2022-08-17 | Discharge: 2022-08-17 | Disposition: A | Discharge status: Home / Self Care | Payer: BC Managed Care – PPO | Source: Ambulatory Visit | Attending: Surgery | Admitting: Surgery

## 2022-08-17 DIAGNOSIS — K651 Peritoneal abscess: Secondary | ICD-10-CM

## 2022-08-17 HISTORY — PX: IR RADIOLOGIST EVAL & MGMT: IMG5224

## 2022-08-21 ENCOUNTER — Other Ambulatory Visit: Payer: Self-pay | Admitting: Hematology

## 2022-08-21 DIAGNOSIS — K6289 Other specified diseases of anus and rectum: Secondary | ICD-10-CM

## 2022-08-23 NOTE — Telephone Encounter (Signed)
Needs to be refilled by pt's PCP

## 2022-08-26 ENCOUNTER — Other Ambulatory Visit: Payer: Self-pay | Admitting: Surgery

## 2022-08-26 DIAGNOSIS — K651 Peritoneal abscess: Secondary | ICD-10-CM

## 2022-08-31 ENCOUNTER — Other Ambulatory Visit: Payer: Self-pay | Admitting: Hematology

## 2022-08-31 DIAGNOSIS — K6289 Other specified diseases of anus and rectum: Secondary | ICD-10-CM

## 2022-09-01 ENCOUNTER — Encounter: Payer: Self-pay | Admitting: Internal Medicine

## 2022-09-01 MED ORDER — IBUPROFEN 800 MG PO TABS: 800.0000 mg | ORAL_TABLET | Freq: Two times a day (BID) | ORAL | 0 refills | Status: DC | PRN

## 2022-09-01 NOTE — Telephone Encounter (Signed)
Get him in for visit.  I have only seen him once and its been a year.  I know he has been seeing oncology regularly.    Go ahead and send #30 no refill

## 2022-09-01 NOTE — Telephone Encounter (Signed)
Called and spoke to patient and he says he will call back to schedule that he doesn't know his other appointments off hand.

## 2022-09-02 ENCOUNTER — Ambulatory Visit
Admission: RE | Admit: 2022-09-02 | Discharge: 2022-09-02 | Disposition: A | Discharge status: Home / Self Care | Payer: BC Managed Care – PPO | Source: Ambulatory Visit | Attending: Surgery | Admitting: Surgery

## 2022-09-02 DIAGNOSIS — K651 Peritoneal abscess: Secondary | ICD-10-CM

## 2022-09-02 HISTORY — PX: IR RADIOLOGIST EVAL & MGMT: IMG5224

## 2022-09-09 ENCOUNTER — Other Ambulatory Visit: Payer: Self-pay | Admitting: Surgery

## 2022-09-09 DIAGNOSIS — K651 Peritoneal abscess: Secondary | ICD-10-CM

## 2022-09-10 ENCOUNTER — Other Ambulatory Visit: Payer: Self-pay

## 2022-09-12 ENCOUNTER — Other Ambulatory Visit: Payer: Self-pay

## 2022-09-20 ENCOUNTER — Ambulatory Visit: Payer: BC Managed Care – PPO | Admitting: Radiation Oncology

## 2022-09-27 ENCOUNTER — Telehealth (HOSPITAL_COMMUNITY): Payer: Self-pay | Admitting: Physician Assistant

## 2022-09-27 NOTE — Progress Notes (Signed)
Patient ID: Bryan [name], male   DOB: May 04, 1971, 51 y.o.   MRN: [id number]  Pt called regarding pain around drain.  On call back, pt voiced having pain Thursday and Friday around site of drain insertion.  He described the pain as so intense it was hard to walk.  The pain dissipated on his own.  He also reported flush material leaking around skin at insertion site.    As pain has resolved, decision made to follow up in clinic at scheduled appointment on 10/5.  Recommended if the flush material continues to leak around drain, to d/c flushing until appointment.  Patient voiced understanding and agreement.  Electronically Signed: Pasty Spillers, PA-C 09/27/2022, 10:54 AM

## 2022-09-30 ENCOUNTER — Ambulatory Visit
Admission: RE | Admit: 2022-09-30 | Discharge: 2022-09-30 | Disposition: A | Discharge status: Home / Self Care | Payer: BC Managed Care – PPO | Source: Ambulatory Visit | Attending: Surgery | Admitting: Surgery

## 2022-09-30 DIAGNOSIS — K651 Peritoneal abscess: Secondary | ICD-10-CM

## 2022-09-30 HISTORY — PX: IR RADIOLOGIST EVAL & MGMT: IMG5224

## 2022-09-30 NOTE — Procedures (Signed)
Chief Complaint: Pelvic abscess  Referring Physician(s): White,Christopher M  History of Present Illness: Bryan [name] is a 51 y.o. male with history of rectal cancer, post low anterior resection and diverting ileostomy performed on 42/59/5638 complicated by development of pelvic abscess requiring percutaneous drainage catheter placement on 07/16/2022.  CT scan of the abdomen pelvis performed 08/03/2022 demonstrated resolution of the pelvic abscess but subsequent drainage catheter injections document fistulous connection to the enteric anastomosis.   Patient presents today for drainage catheter evaluation and management.    The patient continues to flush the drainage catheter and reports approximately 25-50 cc of purulent fluid from the drainage catheter per day.  He reports pain associated with the drainage catheter entrance site.  He denies fever or chills.    Past Medical History:  Diagnosis Date   Allergic rhinitis, cause unspecified    spring only   Hypercholesteremia    Rectal cancer ([place]) 09/14/2021    Past Surgical History:  Procedure Laterality Date   DIVERTING ILEOSTOMY N/A 07/01/2022   Procedure: DIVERTING LOOP ILEOSTOMY;  Surgeon: Ileana Roup, MD;  Location: [facility];  Service: General;  Laterality: N/A;   FLEXIBLE SIGMOIDOSCOPY N/A 07/01/2022   Procedure: FLEXIBLE SIGMOIDOSCOPY;  Surgeon: Ileana Roup, MD;  Location: [facility];  Service: General;  Laterality: N/A;   IR RADIOLOGIST EVAL & MGMT  08/03/2022   IR RADIOLOGIST EVAL & MGMT  08/17/2022   IR RADIOLOGIST EVAL & MGMT  09/02/2022   IR RADIOLOGIST EVAL & MGMT  09/30/2022   None     XI ROBOTIC ASSISTED LOWER ANTERIOR RESECTION N/A 07/01/2022   Procedure: XI ROBOTIC ASSISTED LOWER ANTERIOR RESECTION STAPLED COLOANAL ANASTAMOSIS INTRAOP ASSESSMENT OF PERFUSION AND TAPP BLOCKS;  Surgeon: Ileana Roup, MD;  Location: [facility];  Service: General;  Laterality: N/A;    Allergies: Patient has no known  allergies.  Medications: Prior to Admission medications   Medication Sig Start Date End Date Taking? Authorizing Provider  acetaminophen (TYLENOL) 500 MG tablet Take 1,000 mg by mouth every 6 (six) hours as needed for moderate pain.    [provider]  Cyanocobalamin (B-12 PO) Take 1 tablet by mouth daily.    [provider]  ibuprofen (ADVIL) 800 MG tablet Take 1 tablet (800 mg total) by mouth 2 (two) times daily as needed for moderate pain. 09/01/22   Tysinger, Camelia Eng, PA-C  Sodium Chloride Flush (NORMAL SALINE FLUSH) 0.9 % SOLN Flush catheter daily 08/04/22   Criselda Peaches, MD     Family History  Problem Relation Age of Onset   Cancer Mother 39       Breast   Alcohol abuse Father    Cancer Paternal Aunt        breast   Breast cancer Paternal Aunt    Diabetes Paternal Aunt    Heart disease Paternal Aunt    Breast cancer Cousin    Cancer Paternal Great-grandfather        prostate   Prostate cancer Paternal Great-grandfather    Hypertension Neg Hx    Colon polyps Neg Hx    Esophageal cancer Neg Hx    Rectal cancer Neg Hx    Stomach cancer Neg Hx     Social History   Socioeconomic History   Marital status: Married    Spouse name: Not on file   Number of children: 3   Years of education: Not on file   Highest education level: Not on file  Occupational  History   Occupation: Driver/yard work    Fish farm manager: [employer]    Comment: nights  Tobacco Use   Smoking status: Never   Smokeless tobacco: Never  Vaping Use   Vaping Use: Never used  Substance and Sexual Activity   Alcohol use: No   Drug use: No   Sexual activity: Not on file  Other Topics Concern   Not on file  Social History Narrative   3 children--ages 14, 19, 21 (as of 10/14/21)   Social Determinants of Health   Financial Resource Strain: Not on file  Food Insecurity: Not on file  Transportation Needs: Not on file  Physical Activity: Not on file  Stress: Not on file  Social Connections:  Not on file    ECOG Status: 1 - Symptomatic but completely ambulatory  Review of Systems: A 12 point ROS discussed and pertinent positives are indicated in the HPI above.  All other systems are negative.  Review of Systems  Vital Signs: BP 125/76   Pulse 76   Temp 98.6 F (37 C)   SpO2 98%     Physical Exam  Mallampati Score:     Imaging:  The following examinations were reviewed in detail: CT abdomen and pelvis - 08/03/2022; 07/16/2022 CT-guided percutaneous catheter placement-07/16/2022  Fluoroscopic guided drainage catheter injection-09/02/2022; 08/17/2022; 08/03/2022    IR Radiologist Eval & Mgmt  Result Date: 09/30/2022 Please refer to notes tab for details about interventional procedure. (Op Note)  DG Sinus/Fist Tube Chk-Non GI  Result Date: 09/02/2022 INDICATION: 51 year-old male with a history of rectal cancer status post robotic assisted low anterior resection with stapled colo-anal anastomosis. Postoperative abscess required placement of a percutaneous drainage catheter on 07/16/2022. Patient reports continued drain output of average 30 mL daily. Drain is currently to bag drainage. EXAM: Drain injection using fluoroscopy MEDICATIONS: Per EMR ANESTHESIA/SEDATION: None COMPLICATIONS: None immediate. PROCEDURE: Informed written consent was obtained from the patient after a thorough discussion of the procedural risks, benefits and alternatives. All questions were addressed. Maximal Sterile Barrier Technique was utilized including caps, mask, sterile gowns, sterile gloves, sterile drape, hand hygiene and skin antiseptic. A timeout was performed prior to the initiation of the procedure. The patient was placed prone on the exam table. Injection of the trans gluteal drainage catheter was performed using fluoroscopy. images demonstrate a relatively decompressed abscess cavity. There is brisk communication with the adjacent colon/rectum through a patent fistula. This is  confirmed on both the AP and lateral projections. 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 Patient has surgical follow-up with Dr. Dema Severin scheduled. Electronically Signed   By: Albin Felling M.D.   On: 09/02/2022 14:32   IR Radiologist Eval & Mgmt  Result Date: 09/02/2022 EXAM: ESTABLISHED PATIENT OFFICE VISIT CHIEF COMPLAINT: Refer to separately dictated note HISTORY OF PRESENT ILLNESS: Refer to separately dictated note REVIEW OF SYSTEMS: Refer to separately dictated note PHYSICAL EXAMINATION: Refer to separately dictated note ASSESSMENT AND PLAN: Refer to separately dictated note Electronically Signed   By: Albin Felling M.D.   On: 09/02/2022 13:36    Labs:  CBC: Recent Labs    07/01/22 0527 07/02/22 0508 07/03/22 0531 07/15/22 2158  WBC 3.7* 6.9 8.4 8.8  HGB 13.6 12.1* 13.7 11.5*  HCT 40.9 36.7* 41.2 34.4*  PLT 199 171 182 453*    COAGS: No results for input(s): "INR", "APTT" in the last 8760 hours.  BMP: Recent Labs    07/02/22 0508 07/03/22 0531 07/04/22  0938 07/15/22 2158  NA 136 133* 133* 136  K 4.2 4.8 4.3 4.5  CL 105 101 102 104  CO2 '24 24 23 24  '$ GLUCOSE 129* 198* 121* 119*  BUN '14 13 14 15  '$ CALCIUM 8.5* 8.9 8.6* 8.7*  CREATININE 1.22 1.11 1.12 1.12  GFRNONAA >60 >60 >60 >60    LIVER FUNCTION TESTS: Recent Labs    04/12/22 1250 05/14/22 1406 07/01/22 0527 07/15/22 2158  BILITOT 0.4 0.5 0.9 0.5  AST 24 32 25 17  ALT 30 49* 32 43  ALKPHOS 90 100 73 97  PROT 7.4 6.9 6.9 6.4*  ALBUMIN 4.2 4.1 4.2 2.8*    TUMOR MARKERS: No results for input(s): "AFPTM", "CEA", "CA199", "CHROMGRNA" in the last 8760 hours.  Assessment and Plan:  Bryan [name] is a 51 y.o. male with history of rectal cancer, post low anterior resection and diverting ileostomy performed on 18/29/9371 complicated by development of pelvic abscess requiring percutaneous drainage catheter placement on 07/16/2022.  CT scan of the  abdomen pelvis performed 08/03/2022 demonstrated resolution of the pelvic abscess but subsequent drainage catheter injections document fistulous connection to the enteric anastomosis.   Patient presents today for drainage catheter evaluation and management.    The patient continues to flush the drainage catheter and reports approximately 25-50 cc of purulent fluid from the drainage catheter per day.  He reports pain associated with the drainage catheter entrance site.  He denies fever or chills.   The following examinations were reviewed in detail: CT abdomen and pelvis - 08/03/2022; 07/16/2022 CT-guided percutaneous catheter placement-07/16/2022  Fluoroscopic guided drainage catheter injection-09/02/2022; 08/17/2022; 08/03/2022   Today's drainage catheter injection demonstrates persistent fistulous connection between the decompressed pelvic abscess and the adjacent enteric anastomosis, nearly identical to previous drainage catheter injections.  PLAN:  - The patient was instructed to no longer flush the drainage catheter except for problem solving purposes (such as concern for drainage catheter occlusion which could be seen in the setting of excessive pericatheter leakage).  - The patient was encouraged to maintain diligent records regarding daily drainage catheter output  - As the drainage catheter has been in place since 07/16/2022, we will arrange for the patient to undergo fluoroscopic guided drainage catheter exchange at [facility].  Per patient request, this procedure will be performed with moderate sedation.    Thank you for this interesting consult.  I greatly enjoyed meeting Bryan [name] and look forward to participating in their care.  A copy of this report was sent to the requesting provider on this date.  Electronically Signed: Sandi Mariscal 09/30/2022, 4:05 PM   I spent a total of 10 Minutes in face to face in clinical consultation, greater than 50% of which was  counseling/coordinating care for drainage catheter evaluation and management.

## 2022-10-01 ENCOUNTER — Other Ambulatory Visit (HOSPITAL_COMMUNITY): Payer: Self-pay | Admitting: Interventional Radiology

## 2022-10-01 DIAGNOSIS — K651 Peritoneal abscess: Secondary | ICD-10-CM

## 2022-10-02 ENCOUNTER — Other Ambulatory Visit: Payer: Self-pay

## 2022-10-03 ENCOUNTER — Other Ambulatory Visit: Payer: Self-pay

## 2022-10-05 ENCOUNTER — Encounter: Payer: Self-pay | Admitting: Internal Medicine

## 2022-10-06 ENCOUNTER — Other Ambulatory Visit: Payer: Self-pay | Admitting: Radiology

## 2022-10-06 DIAGNOSIS — K651 Peritoneal abscess: Secondary | ICD-10-CM

## 2022-10-07 ENCOUNTER — Other Ambulatory Visit: Payer: Self-pay | Admitting: Internal Medicine

## 2022-10-08 ENCOUNTER — Encounter (HOSPITAL_COMMUNITY): Payer: Self-pay

## 2022-10-08 ENCOUNTER — Other Ambulatory Visit (HOSPITAL_COMMUNITY): Payer: Self-pay | Admitting: Interventional Radiology

## 2022-10-08 ENCOUNTER — Ambulatory Visit (HOSPITAL_COMMUNITY)
Admission: RE | Admit: 2022-10-08 | Discharge: 2022-10-08 | Disposition: A | Discharge status: Home / Self Care | Payer: BC Managed Care – PPO | Source: Ambulatory Visit

## 2022-10-08 ENCOUNTER — Ambulatory Visit (HOSPITAL_COMMUNITY)
Admission: RE | Admit: 2022-10-08 | Discharge: 2022-10-08 | Disposition: A | Discharge status: Home / Self Care | Payer: BC Managed Care – PPO | Source: Ambulatory Visit | Attending: Interventional Radiology | Admitting: Interventional Radiology

## 2022-10-08 ENCOUNTER — Other Ambulatory Visit: Payer: Self-pay

## 2022-10-08 DIAGNOSIS — K651 Peritoneal abscess: Secondary | ICD-10-CM | POA: Insufficient documentation

## 2022-10-08 DIAGNOSIS — C20 Malignant neoplasm of rectum: Secondary | ICD-10-CM | POA: Diagnosis not present

## 2022-10-08 DIAGNOSIS — Z4803 Encounter for change or removal of drains: Secondary | ICD-10-CM | POA: Diagnosis present

## 2022-10-08 HISTORY — PX: IR CATHETER TUBE CHANGE: IMG717

## 2022-10-08 LAB — CBC WITH DIFFERENTIAL/PLATELET
Abs Immature Granulocytes: 0.02 10*3/uL (ref 0.00–0.07)
Basophils Absolute: 0.1 10*3/uL (ref 0.0–0.1)
Basophils Relative: 1 %
Eosinophils Absolute: 0.3 10*3/uL (ref 0.0–0.5)
Eosinophils Relative: 7 %
HCT: 37.7 % — ABNORMAL LOW (ref 39.0–52.0)
Hemoglobin: 12.4 g/dL — ABNORMAL LOW (ref 13.0–17.0)
Immature Granulocytes: 1 %
Lymphocytes Relative: 28 %
Lymphs Abs: 1.1 10*3/uL (ref 0.7–4.0)
MCH: 26.5 pg (ref 26.0–34.0)
MCHC: 32.9 g/dL (ref 30.0–36.0)
MCV: 80.6 fL (ref 80.0–100.0)
Monocytes Absolute: 0.5 10*3/uL (ref 0.1–1.0)
Monocytes Relative: 14 %
Neutro Abs: 2 10*3/uL (ref 1.7–7.7)
Neutrophils Relative %: 49 %
Platelets: 282 10*3/uL (ref 150–400)
RBC: 4.68 MIL/uL (ref 4.22–5.81)
RDW: 15.6 % — ABNORMAL HIGH (ref 11.5–15.5)
WBC: 3.9 10*3/uL — ABNORMAL LOW (ref 4.0–10.5)
nRBC: 0 % (ref 0.0–0.2)

## 2022-10-08 LAB — BASIC METABOLIC PANEL
Anion gap: 5 (ref 5–15)
BUN: 12 mg/dL (ref 6–20)
CO2: 26 mmol/L (ref 22–32)
Calcium: 9.2 mg/dL (ref 8.9–10.3)
Chloride: 108 mmol/L (ref 98–111)
Creatinine, Ser: 1.22 mg/dL (ref 0.61–1.24)
GFR, Estimated: >60 mL/min (ref >60)
Glucose, Bld: 113 mg/dL — ABNORMAL HIGH (ref 70–99)
Potassium: 4.4 mmol/L (ref 3.5–5.1)
Sodium: 139 mmol/L (ref 135–145)

## 2022-10-08 MED ORDER — LIDOCAINE HCL 1 % IJ SOLN
INTRAMUSCULAR | Status: AC
  Filled 2022-10-08: qty 20

## 2022-10-08 MED ORDER — SODIUM CHLORIDE 0.9 % IV SOLN: INTRAVENOUS | Status: DC

## 2022-10-08 MED ORDER — FENTANYL CITRATE (PF) 100 MCG/2ML IJ SOLN
INTRAMUSCULAR | Status: AC
  Filled 2022-10-08: qty 2

## 2022-10-08 MED ORDER — MIDAZOLAM HCL 2 MG/2ML IJ SOLN
INTRAMUSCULAR | Status: AC
  Filled 2022-10-08: qty 2

## 2022-10-08 MED ORDER — FENTANYL CITRATE (PF) 100 MCG/2ML IJ SOLN
INTRAMUSCULAR | Status: AC | PRN
  Administered 2022-10-08 (×2): 50 ug via INTRAVENOUS

## 2022-10-08 MED ORDER — IOHEXOL 300 MG/ML  SOLN
50.0000 mL | Freq: Once | INTRAMUSCULAR | Status: AC | PRN
  Administered 2022-10-08: 15 mL

## 2022-10-08 MED ORDER — LIDOCAINE HCL 1 % IJ SOLN
INTRAMUSCULAR | Status: AC | PRN
  Administered 2022-10-08: 15 mL

## 2022-10-08 MED ORDER — MIDAZOLAM HCL 2 MG/2ML IJ SOLN
INTRAMUSCULAR | Status: AC | PRN
  Administered 2022-10-08 (×2): 1 mg via INTRAVENOUS

## 2022-10-08 NOTE — H&P (Signed)
Referring Physician(s): White,C  Supervising Physician: Aletta Edouard  Patient Status:  [facility]  Chief Complaint: Pelvic abscess, rectal cancer   Subjective: Bryan [name] is a 51 y.o. male with history of rectal cancer, post low anterior resection and diverting ileostomy performed on 16/09/9603 complicated by development of pelvic abscess requiring percutaneous drainage catheter placement on 07/16/2022. CT scan of the abdomen pelvis performed 08/03/2022 demonstrated resolution of the pelvic abscess but subsequent drainage catheter injections document fistulous connection to the enteric anastomosis. On 09/30/22 he underwent f/u IR evaluation . Catheter injection demonstrated persistent fistulous connection between the decompressed pelvic abscess and the adjacent enteric anastomosis, nearly identical to previous drainage catheter injections.As the drainage catheter has been in place since 07/16/2022, pt presents now for fluoroscopic guided drainage catheter exchange at [facility]. He is currently not flushing drain. He is not on antibiotic therapy. Previous cx grew enterococcus and streptococcus, mod actinomyces. He denies fever,HA,CP,dyspnea, cough , abd pain,N/V or bleeding. He does have some soreness at left TG drain insertion site.   Past Medical History:  Diagnosis Date   Allergic rhinitis, cause unspecified    spring only   Hypercholesteremia    Rectal cancer ([place]) 09/14/2021   Past Surgical History:  Procedure Laterality Date   DIVERTING ILEOSTOMY N/A 07/01/2022   Procedure: DIVERTING LOOP ILEOSTOMY;  Surgeon: Ileana Roup, MD;  Location: [facility];  Service: General;  Laterality: N/A;   FLEXIBLE SIGMOIDOSCOPY N/A 07/01/2022   Procedure: FLEXIBLE SIGMOIDOSCOPY;  Surgeon: Ileana Roup, MD;  Location: [facility];  Service: General;  Laterality: N/A;   IR RADIOLOGIST EVAL & MGMT  08/03/2022   IR RADIOLOGIST EVAL & MGMT  08/17/2022   IR RADIOLOGIST EVAL & MGMT  09/02/2022    IR RADIOLOGIST EVAL & MGMT  09/30/2022   None     XI ROBOTIC ASSISTED LOWER ANTERIOR RESECTION N/A 07/01/2022   Procedure: XI ROBOTIC ASSISTED LOWER ANTERIOR RESECTION STAPLED COLOANAL ANASTAMOSIS INTRAOP ASSESSMENT OF PERFUSION AND TAPP BLOCKS;  Surgeon: Ileana Roup, MD;  Location: [facility];  Service: General;  Laterality: N/A;        Allergies: Patient has no known allergies.  Medications: Prior to Admission medications   Medication Sig Start Date End Date Taking? Authorizing Provider  acetaminophen (TYLENOL) 500 MG tablet Take 1,000 mg by mouth every 6 (six) hours as needed for moderate pain.   Yes [provider]  Cyanocobalamin (B-12 PO) Take 1 tablet by mouth daily.   Yes [provider]  ibuprofen (ADVIL) 800 MG tablet Take 1 tablet (800 mg total) by mouth 2 (two) times daily as needed for moderate pain. 09/01/22  Yes Tysinger, Camelia Eng, PA-C  Sodium Chloride Flush (NORMAL SALINE FLUSH) 0.9 % SOLN Flush catheter daily 08/04/22   Criselda Peaches, MD     Vital Signs:BP 117/86, HR 74, R 18, temp 97.8, O2 sats 100% RA  Ht '5\' 10"'$  (1.778 m)   Wt 153 lb (69.4 kg)   BMI 21.95 kg/m   Physical Exam awake/alert; chest- CTA bilat; heart- RRR; abd- soft,+BS,NT, RLQ ostomy intact; left TG drain intact with feculent appearing material in bag; no LE edema  Imaging: No results found.  Labs:  CBC: Recent Labs    07/01/22 0527 07/02/22 0508 07/03/22 0531 07/15/22 2158  WBC 3.7* 6.9 8.4 8.8  HGB 13.6 12.1* 13.7 11.5*  HCT 40.9 36.7* 41.2 34.4*  PLT 199 171 182 453*    COAGS: No results for input(s): "INR", "  APTT" in the last 8760 hours.  BMP: Recent Labs    07/02/22 0508 07/03/22 0531 07/04/22 0447 07/15/22 2158  NA 136 133* 133* 136  K 4.2 4.8 4.3 4.5  CL 105 101 102 104  CO2 '24 24 23 24  '$ GLUCOSE 129* 198* 121* 119*  BUN '14 13 14 15  '$ CALCIUM 8.5* 8.9 8.6* 8.7*  CREATININE 1.22 1.11 1.12 1.12  GFRNONAA >60 >60 >60 >60    LIVER FUNCTION  TESTS: Recent Labs    04/12/22 1250 05/14/22 1406 07/01/22 0527 07/15/22 2158  BILITOT 0.4 0.5 0.9 0.5  AST 24 32 25 17  ALT 30 49* 32 43  ALKPHOS 90 100 73 97  PROT 7.4 6.9 6.9 6.4*  ALBUMIN 4.2 4.1 4.2 2.8*    Assessment and Plan: Bryan [name] is a 51 y.o. male with history of rectal cancer, post low anterior resection and diverting ileostomy performed on 06/19/7627 complicated by development of pelvic abscess requiring percutaneous drainage catheter placement on 07/16/2022. CT scan of the abdomen pelvis performed 08/03/2022 demonstrated resolution of the pelvic abscess but subsequent drainage catheter injections document fistulous connection to the enteric anastomosis. On 09/30/22 he underwent f/u IR evaluation . Catheter injection demonstrated persistent fistulous connection between the decompressed pelvic abscess and the adjacent enteric anastomosis, nearly identical to previous drainage catheter injections.As the drainage catheter has been in place since 07/16/2022, pt presents now for fluoroscopic guided drainage catheter exchange at [facility]. He is currently not flushing drain. He is not on antibiotic therapy. Previous cx grew enterococcus and streptococcus, mod actinomyces. Details/risks of procedure, incl but not limited to internal bleeding, infection, injury to adjacent structures d/w pt with his understanding and consent.    Electronically Signed: D. Rowe Robert, PA-C 10/08/2022, 7:56 AM   I spent a total of 20 minutes at the the patient's bedside AND on the patient's hospital floor or unit, greater than 50% of which was counseling/coordinating care for pelvic abscess drain injection/exchange

## 2022-10-08 NOTE — Discharge Instructions (Addendum)
Discharge Instructions:   Please call Interventional Radiology clinic [phone] with any questions or concerns.  You may remove your dressing in 48 hours and shower.  May do a sponge bath tomorrow.  12-03-22 at 12:30 pm to arrive for a 1:00 pm appointment at Interventional Radiology at [facility] for another drain exchange.(8 weeks)   Moderate Conscious Sedation, Adult, Care After This sheet gives you information about how to care for yourself after your procedure. Your health care provider may also give you more specific instructions. If you have problems or questions, contact your health care provider. What can I expect after the procedure? After the procedure, it is common to have: Sleepiness for several hours. Impaired judgment for several hours. Difficulty with balance. Vomiting if you eat too soon. Follow these instructions at home: For the time period you were told by your health care provider: Rest. Do not participate in activities where you could fall or become injured. Do not drive or use machinery. Do not drink alcohol. Do not take sleeping pills or medicines that cause drowsiness. Do not make important decisions or sign legal documents. Do not take care of children on your own. Eating and drinking  Follow the diet recommended by your health care provider. Drink enough fluid to keep your urine pale yellow. If you vomit: Drink water, juice, or soup when you can drink without vomiting. Make sure you have little or no nausea before eating solid foods. General instructions Take over-the-counter and prescription medicines only as told by your health care provider. Have a responsible adult stay with you for the time you are told. It is important to have someone help care for you until you are awake and alert. Do not smoke. Keep all follow-up visits as told by your health care provider. This is important. Contact a health care provider if: You are still sleepy  or having trouble with balance after 24 hours. You feel light-headed. You keep feeling nauseous or you keep vomiting. You develop a rash. You have a fever. You have redness or swelling around the IV site. Get help right away if: You have trouble breathing. You have new-onset confusion at home. Summary After the procedure, it is common to feel sleepy, have impaired judgment, or feel nauseous if you eat too soon. Rest after you get home. Know the things you should not do after the procedure. Follow the diet recommended by your health care provider and drink enough fluid to keep your urine pale yellow. Get help right away if you have trouble breathing or new-onset confusion at home. This information is not intended to replace advice given to you by your health care provider. Make sure you discuss any questions you have with your health care provider. Document Revised: 04/11/2020 Document Reviewed: 11/08/2019 Elsevier Patient Education  [place].

## 2022-10-09 ENCOUNTER — Other Ambulatory Visit: Payer: Self-pay

## 2022-11-09 ENCOUNTER — Telehealth: Payer: Self-pay | Admitting: Medical

## 2022-11-09 NOTE — Telephone Encounter (Signed)
Pt is scheduled for a med check on 11/22/22 and he is requesting lab work to check his A1c's.

## 2022-11-22 ENCOUNTER — Encounter: Payer: BC Managed Care – PPO | Admitting: Medical

## 2022-12-03 ENCOUNTER — Ambulatory Visit (HOSPITAL_COMMUNITY)
Admission: RE | Admit: 2022-12-03 | Discharge: 2022-12-03 | Disposition: A | Discharge status: Home / Self Care | Payer: BC Managed Care – PPO | Source: Ambulatory Visit | Attending: Interventional Radiology | Admitting: Interventional Radiology

## 2022-12-03 DIAGNOSIS — Z4803 Encounter for change or removal of drains: Secondary | ICD-10-CM | POA: Insufficient documentation

## 2022-12-03 DIAGNOSIS — K651 Peritoneal abscess: Secondary | ICD-10-CM | POA: Diagnosis not present

## 2022-12-03 HISTORY — PX: IR CATHETER TUBE CHANGE: IMG717

## 2022-12-03 MED ORDER — IOHEXOL 300 MG/ML  SOLN
50.0000 mL | Freq: Once | INTRAMUSCULAR | Status: AC | PRN
  Administered 2022-12-03: 10 mL

## 2022-12-03 MED ORDER — LIDOCAINE HCL 1 % IJ SOLN
INTRAMUSCULAR | Status: AC
  Filled 2022-12-03: qty 20

## 2022-12-28 ENCOUNTER — Ambulatory Visit: Payer: BC Managed Care – PPO | Admitting: Physical Therapy

## 2022-12-31 ENCOUNTER — Encounter (HOSPITAL_COMMUNITY): Payer: Self-pay | Admitting: Interventional Radiology

## 2022-12-31 NOTE — Addendum Note (Signed)
Encounter addended by: Wolfgang Phoenix on: 12/31/2022 9:27 AM  Actions taken: Charge Capture section accepted

## 2023-01-28 NOTE — Progress Notes (Signed)
Attempted to obtain medical history via telephone, unable to reach at this time. HIPAA compliant voicemail message left requesting return call to pre surgical testing department. 

## 2023-02-01 ENCOUNTER — Other Ambulatory Visit (HOSPITAL_COMMUNITY): Payer: Self-pay | Admitting: Interventional Radiology

## 2023-02-01 DIAGNOSIS — K651 Peritoneal abscess: Secondary | ICD-10-CM

## 2023-02-03 NOTE — Anesthesia Preprocedure Evaluation (Addendum)
Anesthesia Evaluation  Patient identified by MRN, date of birth, ID band Patient awake    Reviewed: Allergy & Precautions, NPO status , Patient's Chart, lab work & pertinent test results  History of Anesthesia Complications Negative for: history of anesthetic complications  Airway Mallampati: II  TM Distance: >3 FB     Dental no notable dental hx. (+) Dental Advisory Given   Pulmonary neg pulmonary ROS   Pulmonary exam normal        Cardiovascular negative cardio ROS Normal cardiovascular exam     Neuro/Psych negative neurological ROS     GI/Hepatic Neg liver ROS,,,Hx rectal Ca, s/p resection   Endo/Other  negative endocrine ROS    Renal/GU negative Renal ROS     Musculoskeletal negative musculoskeletal ROS (+)    Abdominal   Peds  Hematology negative hematology ROS (+)   Anesthesia Other Findings   Reproductive/Obstetrics                             Anesthesia Physical Anesthesia Plan  ASA: 2  Anesthesia Plan: MAC   Post-op Pain Management: Minimal or no pain anticipated   Induction:   PONV Risk Score and Plan: 1 and Ondansetron, Propofol infusion and Midazolam  Airway Management Planned: Natural Airway  Additional Equipment: None  Intra-op Plan:   Post-operative Plan:   Informed Consent: I have reviewed the patients History and Physical, chart, labs and discussed the procedure including the risks, benefits and alternatives for the proposed anesthesia with the patient or authorized representative who has indicated his/her understanding and acceptance.     Dental advisory given  Plan Discussed with: Anesthesiologist and CRNA  Anesthesia Plan Comments:        Anesthesia Quick Evaluation

## 2023-02-04 ENCOUNTER — Other Ambulatory Visit: Payer: Self-pay

## 2023-02-04 ENCOUNTER — Encounter (HOSPITAL_COMMUNITY)
Admission: RE | Disposition: A | Discharge status: Home / Self Care | Payer: Self-pay | Source: Home / Self Care | Attending: Surgery

## 2023-02-04 ENCOUNTER — Encounter (HOSPITAL_COMMUNITY): Payer: Self-pay | Admitting: Surgery

## 2023-02-04 ENCOUNTER — Ambulatory Visit (HOSPITAL_COMMUNITY)
Admission: RE | Admit: 2023-02-04 | Discharge: 2023-02-04 | Disposition: A | Discharge status: Home / Self Care | Payer: BC Managed Care – PPO | Attending: Surgery | Admitting: Surgery

## 2023-02-04 ENCOUNTER — Ambulatory Visit (HOSPITAL_COMMUNITY): Payer: BC Managed Care – PPO | Admitting: Anesthesiology

## 2023-02-04 DIAGNOSIS — Z923 Personal history of irradiation: Secondary | ICD-10-CM | POA: Insufficient documentation

## 2023-02-04 DIAGNOSIS — Z9221 Personal history of antineoplastic chemotherapy: Secondary | ICD-10-CM | POA: Diagnosis not present

## 2023-02-04 DIAGNOSIS — E785 Hyperlipidemia, unspecified: Secondary | ICD-10-CM | POA: Diagnosis not present

## 2023-02-04 DIAGNOSIS — Z09 Encounter for follow-up examination after completed treatment for conditions other than malignant neoplasm: Secondary | ICD-10-CM | POA: Diagnosis not present

## 2023-02-04 DIAGNOSIS — Z980 Intestinal bypass and anastomosis status: Secondary | ICD-10-CM | POA: Diagnosis not present

## 2023-02-04 DIAGNOSIS — Z85048 Personal history of other malignant neoplasm of rectum, rectosigmoid junction, and anus: Secondary | ICD-10-CM | POA: Insufficient documentation

## 2023-02-04 HISTORY — PX: FLEXIBLE SIGMOIDOSCOPY: SHX5431

## 2023-02-04 SURGERY — SIGMOIDOSCOPY, FLEXIBLE
Anesthesia: Monitor Anesthesia Care

## 2023-02-04 MED ORDER — ONDANSETRON HCL 4 MG/2ML IJ SOLN
INTRAMUSCULAR | Status: DC | PRN
  Administered 2023-02-04: 4 mg via INTRAVENOUS

## 2023-02-04 MED ORDER — PROPOFOL 500 MG/50ML IV EMUL
INTRAVENOUS | Status: DC | PRN
  Administered 2023-02-04: 125 ug/kg/min via INTRAVENOUS

## 2023-02-04 MED ORDER — PROPOFOL 500 MG/50ML IV EMUL
INTRAVENOUS | Status: AC
  Filled 2023-02-04: qty 100

## 2023-02-04 MED ORDER — PROPOFOL 10 MG/ML IV BOLUS
INTRAVENOUS | Status: DC | PRN
  Administered 2023-02-04: 30 mg via INTRAVENOUS

## 2023-02-04 MED ORDER — LACTATED RINGERS IV SOLN
INTRAVENOUS | Status: DC
  Administered 2023-02-04: 1000 mL via INTRAVENOUS

## 2023-02-04 NOTE — Transfer of Care (Signed)
Immediate Anesthesia Transfer of Care Note  Patient: Bryan [name]  Procedure(s) Performed: DIAGNOSTIC FLEXIBLE SIGMOIDOSCOPY  Patient Location: Endoscopy Unit  Anesthesia Type:MAC  Level of Consciousness: awake, alert , and oriented  Airway & Oxygen Therapy: Patient Spontanous Breathing and Patient connected to face mask oxygen  Post-op Assessment: Report given to RN and Post -op Vital signs reviewed and stable  Post vital signs: Reviewed and stable  Last Vitals:  Vitals Value Taken Time  BP    Temp    Pulse 100 02/04/23 1208  Resp 15 02/04/23 1208  SpO2 98 % 02/04/23 1208  Vitals shown include unvalidated device data.  Last Pain:  Vitals:   02/04/23 1040  TempSrc: Temporal  PainSc: 0-No pain         Complications: No notable events documented.

## 2023-02-04 NOTE — H&P (Signed)
CC: Here today for flexible sigmoidoscopy  HPI: Bryan [name] is an 52 y.o. male with history of HLD, whom is seen in the office today as a referral by Dr. Pasty Arch for evaluation of newly diagnosed rectal cancer.   Colonoscopy for hematochezia and rectal pain 09/14/2021 demonstrated for mass in the rectum. Left side. Ulcerated appearance. This was biopsied. Exam otherwise normal. Biopsies returned invasive adenocarcinoma. IHC preserved.  CEA 3.9   CT CAP 09/18/2021 showed the known left rectal mass 3.3 x 1.9 x 4.1 cm. Associated asymmetric haziness in the left perirectal fat with small perirectal lymph nodes. No findings highly suspicious for distant metastatic disease however there are 2 small indeterminate low-density liver lesions. No suspicious findings in the chest. Small perifissural nodules bilaterally, likely benign.  MRI Abdomen (liver) and pelvis (rectal cancer) have not been completed.  He denies any specific complaints today. He reports he will have intermittent pelvic/rectal pains. He will occasionally have some bright red blood per rectum.  He had an MRI abdomen and MRI/MRA of his pelvis completed 09/30/2021 at an outside facility due to some coordination issues with regards to his ability to obtain these studies under insurance coverage. This MRI was not completed with a rectal cancer protocol. It is therefore the recommendation of Dr. Burr Medico that he undergo a repeat MRI.  Outside MRI Abd 09/30/21 -7 mm T2 high signal lesion noted anteriorly in the right lobe of the liver. This lesion is too small to adequately characterize but there is suggestion of peripheral nodular enhancement as may be seen with a small hemangioma.  Outside MRI P + MRA 09/30/21 -"rectal mass correlating with known rectal carcinoma with asymmetric wall thickening, greater on the left. Left border of the rectal mass is indistinct with mild hazy appearance of the adjacent mesorectum. This appearance could be reflective of  motion artifact. However there is suspicion of mild tumor extension into the left aspect of the mesorectum. Tumor additionally narrows the rectal lumen at the site of the mass. Negative for definite significant abdominal or pelvic lymphadenopathy."  Re-read of staging pelvic MRI on 10/13/21 showed stage T3b, N1 disease, with contact with internal sphincter.  He began neoadjuvant chemo with Capox on 10/23/21  He completed chemo/radiation 04/13/2022.  CT CAP on 02/18/22 at [facility] showed the rectal mass measuring 3.9 cm, a little smaller in size, but no significant shrinkage. No adenopathy or metastatic disease.   OR 07/01/22 - Robotic ultra-low anterior resection, flex sig, diverting loop ileostomy PATH - ypT2N0 margins uninvolved; 0/18 LNs. Well differentiated, mucinous component. No LVI/PNI.  He recovered reasonably well, postop ileus but this resolved.  He was readmitted 07/16/2022 with abdominal pain and decreased appetite. He had a normal Delicia Berens count. CT demonstrated a now complex gas fluid collection in the pelvis compatible with "postoperative abscess." JP drain had recently been removed.  IR was consulted and he underwent percutaneous drain placement. Had rapid resolution of all of the symptoms. He was discharged.  IR drain study 08/03/22 - 1. Positive for fistulous communication between the drainage catheter in the adjacent colo enteric anastomosis. 2. Resolved abscess.  IR drain study 09/02/22 - persistent fistulous connection  IR drain study 10/08/22 - exchanged for 10 Fr over wire, follow-up planned 12/03/22  IR Drain study 12/03/22 - Pelvic abscessogram demonstrates continued fistulous communication with bowel. Existing drain replaced with new 10.2 [place] multipurpose pigtail catheter.  Drain previously 20cc/day but now having some days worse than single digits. Still has somewhat of a  foul odor he notes. Often times somewhat of a brownish-green tinge. Doing much better overall from a  discomfort standpoint the drain. No fever or chills. Ileostomy working well. Good appetite. Minimal drainage per anus.  INTERVAL HX Denies any changes in health or health history since we met in the office.  Here today for flexible sigmoidoscopy.  He has been doing quite well.  Drain is had almost no output.  Last time the bag was changed was yesterday and it is currently empty.   PMH: HLD  PSH: Denies  FHx: His mother had breast cancer in her 13s. He has some cousins with unclear malignancies on paternal side. He denies any other known family history of colorectal, breast, endometrial or ovarian cancer  Social Hx: Denies use of tobacco/EtOH/illicit drug. He works for [employer]. He is here today with his wife.  Medical History: Past Medical History:  Diagnosis Date  History of cancer   There is no problem list on file for this patient.  Past Surgical History:  Procedure Laterality Date  XI ROBOTIC ASSISTED LOWER ANTERIOR RESECTION STAPLED COLOANAL ANASTAMOSIS INTRAOP ASSESSMENT OF PERFUSION AND TAPP BLOCKS DIVERTING LOOP ILEOSTOMY FLEXIBLE SIGMOIDOSCOPY 07/01/2022  Dr. Deland Pretty   Past Medical History:  Diagnosis Date   Allergic rhinitis, cause unspecified    spring only   Hypercholesteremia    Rectal cancer ([place]) 09/14/2021    Past Surgical History:  Procedure Laterality Date   DIVERTING ILEOSTOMY N/A 07/01/2022   Procedure: DIVERTING LOOP ILEOSTOMY;  Surgeon: Ileana Roup, MD;  Location: [facility];  Service: General;  Laterality: N/A;   FLEXIBLE SIGMOIDOSCOPY N/A 07/01/2022   Procedure: FLEXIBLE SIGMOIDOSCOPY;  Surgeon: Ileana Roup, MD;  Location: [facility];  Service: General;  Laterality: N/A;   IR CATHETER TUBE CHANGE  10/08/2022   IR CATHETER TUBE CHANGE  12/03/2022   IR RADIOLOGIST EVAL & MGMT  08/03/2022   IR RADIOLOGIST EVAL & MGMT  08/17/2022   IR RADIOLOGIST EVAL & MGMT  09/02/2022   IR RADIOLOGIST EVAL & MGMT  09/30/2022   None     XI ROBOTIC ASSISTED LOWER ANTERIOR  RESECTION N/A 07/01/2022   Procedure: XI ROBOTIC ASSISTED LOWER ANTERIOR RESECTION STAPLED COLOANAL ANASTAMOSIS INTRAOP ASSESSMENT OF PERFUSION AND TAPP BLOCKS;  Surgeon: Ileana Roup, MD;  Location: [facility];  Service: General;  Laterality: N/A;    Family History  Problem Relation Age of Onset   Cancer Mother 30       Breast   Alcohol abuse Father    Cancer Paternal Aunt        breast   Breast cancer Paternal Aunt    Diabetes Paternal Aunt    Heart disease Paternal Aunt    Breast cancer Cousin    Cancer Paternal Great-grandfather        prostate   Prostate cancer Paternal Great-grandfather    Hypertension Neg Hx    Colon polyps Neg Hx    Esophageal cancer Neg Hx    Rectal cancer Neg Hx    Stomach cancer Neg Hx     Social:  reports that he has never smoked. He has never used smokeless tobacco. He reports that he does not drink alcohol and does not use drugs.  Allergies: No Known Allergies  Medications: I have reviewed the patient's current medications.  No results found for this or any previous visit (from the past 48 hour(s)).  No results found.   PE Weight 71.2 kg. Constitutional: NAD; conversant Eyes: Moist conjunctiva  Lungs: Normal respiratory effort CV: RRR MSK: Normal range of motion of extremities Psychiatric: Appropriate affect  No results found for this or any previous visit (from the past 48 hour(s)).  No results found.   A/P: Bryan [name] is an 52 y.o. male with hx of HLD here for follow-up evaluation; hx rectal cancer now s/p TNT And surgery - ultra-low anterior resection, DLI 07/01/22, c/b postop abscess vs anastomotic leak  cT3N1M0 low rectal cancer - completed TNT - neoadj cXRT was completed 04/13/22  ypT2N0M0 (0/18 LNs); margins uninvolved  -Following with medical oncology for surveillance as well  -Continues with steady improvement. No longer apparent abscess cavity on imaging, consolidating down. He has an IR drain in place - exchanged  recently. Minimal output now from his IR drain. None since yesterday.  -We discussed next steps. Will go ahead and plan for flexible sigmoidoscopy with exam under anesthesia today -The planned procedure, material risks (including, but not limited to, pain, bleeding, perforation, need for additional procedures) benefits and alternatives were described. All of his questions were answered to his satisfaction, he expressed understanding, and agreement with the plan. Consent granted to proceed.  Nadeen Landau, [facility], [place]

## 2023-02-04 NOTE — Discharge Instructions (Signed)
YOU HAD AN ENDOSCOPIC PROCEDURE TODAY: Refer to the procedure report and other information in the discharge instructions given to you for any specific questions about what was found during the examination. If this information does not answer your questions, please call [facility] office at [phone] to clarify.   YOU SHOULD EXPECT: Some feelings of bloating in the abdomen. Passage of more gas than usual. Walking can help get rid of the air that was put into your GI tract during the procedure and reduce the bloating. If you had a lower endoscopy (such as a colonoscopy or flexible sigmoidoscopy) you may notice spotting of blood in your stool or on the toilet paper. Some abdominal soreness may be present for a day or two, also.  DIET: Your first meal following the procedure should be a light meal and then it is ok to progress to your normal diet. A half-sandwich or bowl of soup is an example of a good first meal. Heavy or fried foods are harder to digest and may make you feel nauseous or bloated. Drink plenty of fluids but you should avoid alcoholic beverages for 24 hours.   ACTIVITY: Your care partner should take you home directly after the procedure. You should plan to take it easy, moving slowly for the rest of the day. You can resume normal activity the day after the procedure however YOU SHOULD NOT DRIVE, use power tools, machinery or perform tasks that involve climbing or major physical exertion for 24 hours (because of the sedation medicines used during the test).   SYMPTOMS TO REPORT IMMEDIATELY: A gastroenterologist can be reached at any hour. Please call [phone]  for any of the following symptoms:  Following lower endoscopy (colonoscopy, flexible sigmoidoscopy) Excessive amounts of blood in the stool  Significant tenderness, worsening of abdominal pains  Swelling of the abdomen that is new, acute  Fever of 100 or higher    FOLLOW UP:  If any biopsies were taken you will  be contacted by phone or by letter within the next 1-3 weeks. Call [phone]  if you have not heard about the biopsies in 3 weeks.  Please also call with any specific questions about appointments or follow up tests.

## 2023-02-04 NOTE — Anesthesia Postprocedure Evaluation (Signed)
Anesthesia Post Note  Patient: Bryan [name]  Procedure(s) Performed: DIAGNOSTIC FLEXIBLE SIGMOIDOSCOPY     Patient location during evaluation: Endoscopy Anesthesia Type: MAC Level of consciousness: awake and alert Pain management: pain level controlled Vital Signs Assessment: post-procedure vital signs reviewed and stable Respiratory status: spontaneous breathing and respiratory function stable Cardiovascular status: stable Postop Assessment: no apparent nausea or vomiting Anesthetic complications: no   No notable events documented.  Last Vitals:  Vitals:   02/04/23 1215 02/04/23 1220  BP:  114/83  Pulse: 99 (!) 105  Resp: 18 (!) 22  Temp:  36.5 C  SpO2: (!) 89% 94%    Last Pain:  Vitals:   02/04/23 1220  TempSrc: Oral  PainSc: 0-No pain                 Julias Mould DANIEL

## 2023-02-07 ENCOUNTER — Encounter (HOSPITAL_COMMUNITY): Payer: Self-pay | Admitting: Surgery

## 2023-02-09 NOTE — Op Note (Signed)
[facility] Patient Name: Bryan [name] Procedure Date: 02/04/2023 MRN: [id number] Attending MD: Ileana Roup MD, MD,  Date of Birth: 05/12/1971 CSN: [id number] Age: 52 Admit Type: Inpatient Procedure:                Flexible Sigmoidoscopy Indications:              Follow-up endoscopy after surgery, Monitoring for                            anastomosis integrity Providers:                Sharon Mt. Jasraj Lappe MD, MD, Fanny Skates RN, RN,                            Benetta Spar, Technician Referring MD:              Medicines:                Monitored Anesthesia Care Complications:            No immediate complications. Estimated blood loss:                            Minimal. Estimated Blood Loss:     Estimated blood loss was minimal. Procedure:                Pre-Anesthesia Assessment:                           - Prior to the procedure, a History and Physical                            was performed, and patient medications, allergies                            and sensitivities were reviewed. The patient's                            tolerance of previous anesthesia was reviewed.                           - The risks and benefits of the procedure and the                            sedation options and risks were discussed with the                            patient. All questions were answered and informed                            consent was obtained.                           After obtaining informed consent, the scope was                            passed under direct vision. The [id number]-[id number]  Olympus endoscope was introduced through the anus                            and advanced to the the splenic flexure. The                            flexible sigmoidoscopy was accomplished without                            difficulty. The patient tolerated the procedure                            well. The quality of the bowel  preparation was                            adequate. Scope In: Scope Out: Findings:      The digital rectal exam findings include mild stenosis at anastomosis,       traversed with lubricated finger, no palpable mass. Pertinent negatives       include normal sphincter tone.      There was evidence of a prior end-to-end colo-rectal anastomosis in the       distal rectum. This was patent and was characterized by healthy       appearing mucosa and an intact staple line. The anastomosis was       traversed after dilation. Estimated blood loss was minimal.      Perc drain visible in posterior position ~1-2 cm proximal to anastomosis       in posterior midline (see photos) Impression:               - Mild stenosis at anastomosis, traversed with                            lubricated finger, no palpable mass found on                            digital rectal exam.                           - Patent end-to-end colo-rectal anastomosis,                            characterized by healthy appearing mucosa and an                            intact staple line.                           - No specimens collected. Moderate Sedation:      Not Applicable - Patient had care per Anesthesia. Recommendation:           - Discharge patient to home (ambulatory).                           - Repeat colonoscopy with gastroenterology for  ongoing surveillance within next year. Procedure Code(s):        --- Professional ---                           [phone], Sigmoidoscopy, flexible; diagnostic,                            including collection of specimen(s) by brushing or                            washing, when performed (separate procedure) Diagnosis Code(s):        --- Professional ---                           Z98.0, Intestinal bypass and anastomosis status                           Z09, Encounter for follow-up examination after                            completed treatment for  conditions other than                            malignant neoplasm CPT copyright 2022 American Medical Association. All rights reserved. The codes documented in this report are preliminary and upon coder review may  be revised to meet current compliance requirements. Nadeen Landau, MD Ileana Roup MD, MD 02/09/2023 10:48:40 AM This report has been signed electronically. Number of Addenda: 0

## 2023-02-11 ENCOUNTER — Other Ambulatory Visit (HOSPITAL_COMMUNITY): Payer: Self-pay | Admitting: Interventional Radiology

## 2023-02-11 ENCOUNTER — Ambulatory Visit (HOSPITAL_COMMUNITY)
Admission: RE | Admit: 2023-02-11 | Discharge: 2023-02-11 | Disposition: A | Discharge status: Home / Self Care | Payer: BC Managed Care – PPO | Source: Ambulatory Visit | Attending: Interventional Radiology | Admitting: Interventional Radiology

## 2023-02-11 DIAGNOSIS — K651 Peritoneal abscess: Secondary | ICD-10-CM | POA: Insufficient documentation

## 2023-02-11 DIAGNOSIS — Z85048 Personal history of other malignant neoplasm of rectum, rectosigmoid junction, and anus: Secondary | ICD-10-CM | POA: Insufficient documentation

## 2023-02-11 HISTORY — PX: IR FLUORO RM 30-60 MIN: IMG2384

## 2023-02-11 MED ORDER — LIDOCAINE HCL 1 % IJ SOLN
INTRAMUSCULAR | Status: AC
  Filled 2023-02-11: qty 20

## 2023-02-11 NOTE — Procedures (Signed)
Pre procedural Dx: Chronic transgluteal drainage catheter. Post procedural Dx: Same  History of rectal cancer, post low anterior resection diverting ileostomy performed on [id number], complicated by development of a pelvic abscess requiring percutaneous drainage catheter placement on 07/16/2022.  CT scan of the abdomen pelvis performed 08/03/2022 demonstrates resolution of the abscess however subsequent drainage catheter injections, most recently on 12/03/2022 demonstrating persistent fistulous communication between the decompressed pelvic abscess cavity and the adjacent enteric anastomosis.  Previously, patient experienced approximately 25-50 cc of purulent/feculent output from the drainage catheter per day however recently his colon has been successfully cleared with scheduled enemas with recent sigmoidoscopy demonstrating drainage catheter to be maintained within the lumen of the excluded colon.]  Patient now reports no output from the drainage catheter for the past several days/weeks.  Above was discussed at length with referring surgeon, Dr. Dema Severin, and the decision was made to remove the drainage catheter in lieu of capping trial given lack of output from the drainage catheter for the past several days/weeks   Successful bedside removal of chronic left trans gluteal approach percutaneous catheter.   EBL: None Complications: None immediate  Ronny Bacon, MD Pager #: [phone]

## 2023-03-22 ENCOUNTER — Other Ambulatory Visit: Payer: Self-pay | Admitting: Surgery

## 2023-03-22 DIAGNOSIS — C20 Malignant neoplasm of rectum: Secondary | ICD-10-CM

## 2023-03-28 DIAGNOSIS — I2699 Other pulmonary embolism without acute cor pulmonale: Secondary | ICD-10-CM

## 2023-03-28 HISTORY — DX: Other pulmonary embolism without acute cor pulmonale: I26.99

## 2023-03-31 ENCOUNTER — Other Ambulatory Visit: Payer: Self-pay

## 2023-03-31 ENCOUNTER — Emergency Department
Admission: EM | Admit: 2023-03-31 | Discharge: 2023-04-01 | Disposition: A | Discharge status: Home / Self Care | Payer: BC Managed Care – PPO | Attending: Emergency Medicine | Admitting: Emergency Medicine

## 2023-03-31 ENCOUNTER — Encounter: Payer: Self-pay | Admitting: Emergency Medicine

## 2023-03-31 ENCOUNTER — Ambulatory Visit
Admission: RE | Admit: 2023-03-31 | Discharge: 2023-03-31 | Disposition: A | Discharge status: Home / Self Care | Payer: BC Managed Care – PPO | Source: Ambulatory Visit | Attending: Surgery | Admitting: Surgery

## 2023-03-31 DIAGNOSIS — I2699 Other pulmonary embolism without acute cor pulmonale: Secondary | ICD-10-CM | POA: Insufficient documentation

## 2023-03-31 DIAGNOSIS — Z85038 Personal history of other malignant neoplasm of large intestine: Secondary | ICD-10-CM | POA: Insufficient documentation

## 2023-03-31 DIAGNOSIS — C20 Malignant neoplasm of rectum: Secondary | ICD-10-CM

## 2023-03-31 LAB — TROPONIN I (HIGH SENSITIVITY): Troponin I (High Sensitivity): 4 ng/L (ref <18)

## 2023-03-31 LAB — BASIC METABOLIC PANEL
Anion gap: 8 (ref 5–15)
BUN: 17 mg/dL (ref 6–20)
CO2: 25 mmol/L (ref 22–32)
Calcium: 8.8 mg/dL — ABNORMAL LOW (ref 8.9–10.3)
Chloride: 104 mmol/L (ref 98–111)
Creatinine, Ser: 1.2 mg/dL (ref 0.61–1.24)
GFR, Estimated: >60 mL/min (ref >60)
Glucose, Bld: 97 mg/dL (ref 70–99)
Potassium: 3.9 mmol/L (ref 3.5–5.1)
Sodium: 137 mmol/L (ref 135–145)

## 2023-03-31 LAB — CBC
HCT: 43.2 % (ref 39.0–52.0)
Hemoglobin: 14.5 g/dL (ref 13.0–17.0)
MCH: 26.5 pg (ref 26.0–34.0)
MCHC: 33.6 g/dL (ref 30.0–36.0)
MCV: 78.8 fL — ABNORMAL LOW (ref 80.0–100.0)
Platelets: 250 10*3/uL (ref 150–400)
RBC: 5.48 MIL/uL (ref 4.22–5.81)
RDW: 13.2 % (ref 11.5–15.5)
WBC: 3.8 10*3/uL — ABNORMAL LOW (ref 4.0–10.5)
nRBC: 0 % (ref 0.0–0.2)

## 2023-03-31 LAB — PROTIME-INR
INR: 1 (ref 0.8–1.2)
Prothrombin Time: 12.8 seconds (ref 11.4–15.2)

## 2023-03-31 MED ORDER — IOPAMIDOL (ISOVUE-300) INJECTION 61%
100.0000 mL | Freq: Once | INTRAVENOUS | Status: AC | PRN
  Administered 2023-03-31: 100 mL via INTRAVENOUS

## 2023-03-31 NOTE — ED Provider Notes (Signed)
[facility] Provider Note    Event Date/Time   First MD Initiated Contact with Patient 03/31/23 2310     (approximate)   History   Pulmonary Embolism   HPI Bryan [name] is a 52 y.o. male with a history of colorectal cancer status post treatment including laparoscopic sigmoidectomy and radiation and chemotherapy.  He presents for evaluation after having a routine outpatient CT scan of his chest/abdomen/pelvis which showed pulmonary emboli.  His doctors including his oncologist and general surgeon [place] in [place].  His surgeon is Dr. Cliffton AstersWhite, and he was the one who ordered the CT scan.  The patient has not had any recent symptoms.  He has been doing well, is back at work, and denies having any recent shortness of breath, chest pain, nausea, vomiting, nor acute abdominal pain.  He has not been excessively winded with exertion and felt like he was doing much better.  His wife is with him at bedside and was also surprised at the results.     Physical Exam   Triage Vital Signs: ED Triage Vitals  Enc Vitals Group     BP 03/31/23 2249 (!) 124/90     Pulse Rate 03/31/23 2249 70     Resp 03/31/23 2249 16     Temp 03/31/23 2249 98.7 F (37.1 C)     Temp Source 03/31/23 2249 Oral     SpO2 03/31/23 2249 100 %     Weight 03/31/23 2241 79.8 kg (176 lb)     Height 03/31/23 2241 1.778 m (5\' 10" )     Head Circumference --      Peak Flow --      Pain Score 03/31/23 2241 0     Pain Loc --      Pain Edu? --      Excl. in GC? --     Most recent vital signs: Vitals:   04/01/23 0000 04/01/23 0100  BP: (!) 119/91 119/88  Pulse: 68 70  Resp: 14 12  Temp:  98.2 F (36.8 C)  SpO2: 98% 98%    General: Awake, no distress.  Generally well-appearing and seems healthy and in good shape and spite of his medical history. CV:  Good peripheral perfusion.  Regular rate and rhythm, no tachycardia, normal heart sounds. Resp:  Normal effort. Speaking easily and comfortably, no  accessory muscle usage nor intercostal retractions.  Lungs are clear to auscultation bilaterally. Abd:  No distention.  No tenderness to palpation. Other:  Mood and affect are normal and appropriate, patient is attentive and alert and conversant.   ED Results / Procedures / Treatments   Labs (all labs ordered are listed, but only abnormal results are displayed) Labs Reviewed  BASIC METABOLIC PANEL - Abnormal; Notable for the following components:      Result Value   Calcium 8.8 (*)    All other components within normal limits  CBC - Abnormal; Notable for the following components:   WBC 3.8 (*)    MCV 78.8 (*)    All other components within normal limits  PROTIME-INR  TROPONIN I (HIGH SENSITIVITY)  TROPONIN I (HIGH SENSITIVITY)     EKG  ED ECG REPORT I, Bryan Roseory Malakai Schoenherr, the attending physician, personally viewed and interpreted this ECG.  Date: 03/31/2023 EKG Time: 22: 48 Rate: 75 Rhythm: normal sinus rhythm QRS Axis: normal Intervals: normal ST/T Wave abnormalities: normal Narrative Interpretation: no evidence of acute ischemia    RADIOLOGY I viewed and interpreted the CT  chest that was performed earlier on 03/31/2023.  I could not see any specific large or gross abnormalities, but I consulted with the radiologist on call and he confirmed the findings of bilateral nonocclusive PEs.  See hospital course for details.   PROCEDURES:  Critical Care performed: No  Procedures    IMPRESSION / MDM / ASSESSMENT AND PLAN / ED COURSE  I reviewed the triage vital signs and the nursing notes.                              Differential diagnosis includes, but is not limited to, pulmonary emboli with or without right heart strain, ACS, DVT.  Patient's presentation is most consistent with acute presentation with potential threat to life or bodily function.  Labs/studies ordered: Basic metabolic panel, pro time-INR, CBC, high-sensitivity troponin  Interventions/Medications  given:  Medications  apixaban (ELIQUIS) tablet 10 mg (10 mg Oral Given 04/01/23 0110)   (Note:  hospital Course my include additional interventions and/or labs/studies not listed above.)   Patient's vital signs are normal.  He is asymptomatic.  The presence of the pulmonary emboli are essentially an incidental finding.  I strongly considered hospitalization from the beginning given the CT results, but it is also possible that these are nonacute and asymptomatic pulmonary emboli, particularly given that he is not having any symptoms.  I consulted by phone with Dr. Gwenyth Bender with radiology and he evaluated the CT scan as well.  He confirmed the presence of the pulmonary emboli but also confirmed that they are nonocclusive and while they are new as of his last scan which per the patient and his wife was in October (further back than that in the most consistent), we do not know when in the last 6 months or so they started.  They are also nonocclusive.  Even though the initial report indicated possible right heart strain, Dr. Gwenyth Bender indicated that those measurements are very borderline and subject to interpretation based on the clinical findings.  I had an extensive conversation with the patient and his wife and we discussed the risks and benefits of hospitalization versus outpatient anticoagulation (or no treatment).  I offered admission but also explained that I felt that, under the circumstances, he could be appropriately treated with Eliquis as an outpatient and close outpatient follow-up with his hematologist.  He strongly prefers to not stay in the hospital unless it is absolutely necessary.  Given the shared decision making and very reasonable plan, I think this is appropriate for the patient.  I gave him a first dose of Eliquis 10 mg p.o. and gave my usual customary risks and benefits discussion about anticoagulation.  He and his wife understand and agree and they will follow-up with the patient's  oncologist/hematologist in [place].  I gave strict return precautions.  The patient was on the cardiac monitor to evaluate for evidence of arrhythmia and/or significant heart rate changes.       FINAL CLINICAL IMPRESSION(S) / ED DIAGNOSES   Final diagnoses:  Pulmonary embolism, unspecified chronicity, unspecified pulmonary embolism type, unspecified whether acute cor pulmonale present     Rx / DC Orders   ED Discharge Orders          Ordered    APIXABAN (ELIQUIS) VTE STARTER PACK (10MG  AND 5MG )        04/01/23 0100             Note:  This document was prepared  using Conservation officer, historic buildings and may include unintentional dictation errors.   Bryan Rose, MD 04/01/23 [phone]

## 2023-03-31 NOTE — ED Triage Notes (Signed)
Pt arrived via POV with reports of having routine CT of chest done after colorectal CA and chemo.  CT scan showed bilateral PE, pt has no prior hx of PEs, denies any chest pain or shortness of breath.

## 2023-04-01 ENCOUNTER — Telehealth: Payer: Self-pay

## 2023-04-01 LAB — TROPONIN I (HIGH SENSITIVITY): Troponin I (High Sensitivity): 5 ng/L (ref <18)

## 2023-04-01 MED ORDER — APIXABAN (ELIQUIS) VTE STARTER PACK (10MG AND 5MG): ORAL_TABLET | ORAL | 0 refills | Status: DC

## 2023-04-01 MED ORDER — APIXABAN 5 MG PO TABS
10.0000 mg | ORAL_TABLET | ORAL | Status: AC
  Administered 2023-04-01: 10 mg via ORAL
  Filled 2023-04-01: qty 2

## 2023-04-01 NOTE — Discharge Instructions (Addendum)
As we discussed, although you have pulmonary emboli on your CT scan, we believe that is safe for you to go home at this time given that you have absolutely no other concerning signs or symptoms.  We are starting you on a medication called Eliquis which is an anticoagulant that should treat the existing pulmonary emboli (which may have been present for months and are nonocclusive) as well as preventing additional blood clots from forming.  We recommend you follow-up with your oncologist/hematologist for additional management.  Please take the medication as prescribed (the dose is higher for the first week).  Please avoid taking medications such as aspirin, naproxen, and ibuprofen (NSAIDs ) while taking Eliquis.  It is safe for you to take acetaminophen (Tylenol).    Continue taking your other medications and remember to avoid scenarios that might lead to physical injury and increased bleeding risk (high risk sports, situations that might lead to falls, etc.).  Return to the nearest emergency department if you develop new or worsening symptoms that concern you.

## 2023-04-01 NOTE — Telephone Encounter (Signed)
Pt called stating he just recently was released out of the hospital and was told that he needs to f/u with Dr. Mosetta Putt.  Pt is requesting a f/u appt with Dr. Mosetta Putt.  Notified Chelsea in Scheduling to call pt to get pt scheduled for f/u appt.

## 2023-04-04 ENCOUNTER — Other Ambulatory Visit: Payer: Self-pay | Admitting: Hematology

## 2023-04-04 ENCOUNTER — Other Ambulatory Visit: Payer: Self-pay

## 2023-04-04 ENCOUNTER — Encounter: Payer: Self-pay | Admitting: Hematology

## 2023-04-04 ENCOUNTER — Telehealth: Payer: Self-pay

## 2023-04-04 ENCOUNTER — Inpatient Hospital Stay: Payer: BC Managed Care – PPO | Attending: Hematology | Admitting: Hematology

## 2023-04-04 ENCOUNTER — Telehealth: Payer: Self-pay | Admitting: Hematology

## 2023-04-04 ENCOUNTER — Inpatient Hospital Stay: Payer: BC Managed Care – PPO

## 2023-04-04 VITALS — BP 121/75 | HR 83 | Temp 98.6°F | Resp 18 | Ht 70.0 in | Wt 178.9 lb

## 2023-04-04 DIAGNOSIS — Z8042 Family history of malignant neoplasm of prostate: Secondary | ICD-10-CM | POA: Diagnosis not present

## 2023-04-04 DIAGNOSIS — I2692 Saddle embolus of pulmonary artery without acute cor pulmonale: Secondary | ICD-10-CM | POA: Diagnosis not present

## 2023-04-04 DIAGNOSIS — C20 Malignant neoplasm of rectum: Secondary | ICD-10-CM | POA: Diagnosis not present

## 2023-04-04 DIAGNOSIS — G620 Drug-induced polyneuropathy: Secondary | ICD-10-CM | POA: Insufficient documentation

## 2023-04-04 DIAGNOSIS — Z08 Encounter for follow-up examination after completed treatment for malignant neoplasm: Secondary | ICD-10-CM | POA: Diagnosis present

## 2023-04-04 DIAGNOSIS — Z7901 Long term (current) use of anticoagulants: Secondary | ICD-10-CM | POA: Insufficient documentation

## 2023-04-04 DIAGNOSIS — R739 Hyperglycemia, unspecified: Secondary | ICD-10-CM | POA: Diagnosis not present

## 2023-04-04 DIAGNOSIS — Z803 Family history of malignant neoplasm of breast: Secondary | ICD-10-CM | POA: Diagnosis not present

## 2023-04-04 DIAGNOSIS — Z85048 Personal history of other malignant neoplasm of rectum, rectosigmoid junction, and anus: Secondary | ICD-10-CM | POA: Insufficient documentation

## 2023-04-04 MED ORDER — APIXABAN 5 MG PO TABS: 5.0000 mg | ORAL_TABLET | Freq: Two times a day (BID) | ORAL | 4 refills | Status: DC

## 2023-04-04 NOTE — Telephone Encounter (Signed)
Pt called stated he got his CT Scan done on 03/31/2023 at [facility] and was contacted by Radiology to return to the ED at [facility] d/t Pulmonary Embelus on pt's CT Scan.  Pt stated he went to the [facility] ED as directed and was d/c on Eliquis since he was Asymptomatic.  Pt stated he was told to f/u with Dr. Mosetta Putt.  Pt was calling to get a f/u appt with Dr. Mosetta Putt.  Informed pt that this RN will make Dr. Mosetta Putt aware of his recent diagnosis and will have Chelsea to contact the pt to get him scheduled for a f/u appt with Dr. Mosetta Putt.

## 2023-04-04 NOTE — Telephone Encounter (Signed)
Contacted patient to scheduled appointments. Patient is aware of appointments that are scheduled.   

## 2023-04-04 NOTE — Progress Notes (Signed)
[facility]   [phone] [phone]   Clinic Follow up Note   Patient Care Team: Tysinger, Kermit Balo, PA-C as PCP - General (Family Medicine) Andria Meuse, MD as Consulting Physician (General Surgery) Malachy Mood, MD as Consulting Physician (Hematology) Dorothy Puffer, MD as Consulting Physician (Radiation Oncology) Pollyann Samples, NP as Nurse Practitioner (Nurse Practitioner) Marily Lente, RN as Registered Nurse Carmina Miller, MD as Consulting Physician (Radiation Oncology) Weyman Croon Radiology, MD as Rounding Team (Interventional Radiology)  Date of Service:  04/04/2023  CHIEF COMPLAINT: f/u of  rectal cancer }  CURRENT THERAPY:   PENDING Surgery 07/03/22   ASSESSMENT:  Bryan [name] is a 52 y.o. male with   1.  Unprovoked bilateral PE -This was incidental finding on his surveillance CT scan on March 31, 2023 -I personally reviewed his CT scan images with patient, he has bilateral nonocclusive PEs, most in the right side. -He is asymptomatic, he has been back to work 2 to 3 months ago, and is physically active. -May need additional workup ruled out cancer recurrence as a potential cause of his PE -Will recommend at least 6 months of anticoagulation, or indefinite depending on cancer work up   2. Adenocarcinoma of the rectum, MMR normal, cT3b N1 M0, ypT2N0 -Diagnosed in October 2022 -Baseline CEA normal 3.9 -re-read of staging pelvic MRI on 10/13/21 showed stage T3b, N1 disease, with contact with internal sphincter. -he began neoadjuvant chemo with Capox on 10/23/21. He has tolerated relatively well with some nausea and cold sensitivity. Final oxali dose reduced due to neuropathy. -his CEA has been slowly rising, most recently 8.3 on 02/05/22 -CT CAP on 02/18/22 at [facility] showed the rectal mass measuring 3.9 cm, a little smaller in size, but no significant shrinkage. No adenopathy or metastatic disease.  -he completed radiation  therapy at [facility] with concurrent Xeloda 03/02/22 - 04/13/22.  -He underwent robotic assisted low anterior resection by Dr. Cliffton Asters on July 01, 2022.  I reviewed his surgical pathology findings, margins were negative.  No adjuvant chemotherapy needed. -He has not see me back since his surgery, I discussed cancer surveillance.  Due to his recent unprovoked PE, I would recommend to rule out cancer recurrence.  I reviewed his recent CT scan findings, which showed no definitive evidence of recurrence, but he does have a small air and fluid containing cavity at the anastomosis measuring 2.3 x 1.6 cm.  This is likely a cyst surgical change and resolving abscess, but local recurrence is not ruled out.  He did have flexible sigmoidoscopy in February 2024, I will reach out to Dr. Cliffton Asters to see if PET scan or for colonoscopy (due in June) needed now  -I will obtain lab including CEA and Guardian review next week  3. 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 -stable, no functional deficits. He declines gabapentin for now.   4. Genetics -Patient has strong family history of breast cancer and one relative with prostate cancer. Given this and his personal history of cancer, he qualifies for genetic testing and is interested.   -He has 3 children who are aware of his diagnosis and their potential risks -his rectal tumor is MMR normal, likely not Lynch syndrome -I referred him to genetics previously, he canceled appointment.  PLAN: - reviewed CT scan from ER - contact Dr. Tomasa Rand and Dr. Cliffton Asters to see if he need full colonoscopy soon  - Guardant Reveal to be ordered and  schedule for next week  -phone visit in 3 weeks to review lab results    SUMMARY OF ONCOLOGIC HISTORY: Oncology History  Rectal cancer  08/18/2021 Initial Diagnosis   Rectal cancer (HCC)   09/14/2021 Procedure   Colonoscopy by Dr. Tiajuana Amass -The perianal examination was normal. - The digital rectal  exam revealed a firm rectal mass palpated 1-2 cm from the anal verge. The mass was non-circumferential and located predominantly at the left bowel wall. - An ulcerated non-obstructing medium-sized friable mass was found in the distal rectum. The mass was non-circumferential. The mass measured three cm in length. No bleeding was present. Biopsies (5 passes total for 10 total biopsy specimens)were taken with a cold forceps for histology. Estimated blood loss was minimal. - The exam was otherwise normal throughout the examined colon. - The terminal ileum appeared normal. - No additional abnormalities were found on retroflexion   09/14/2021 Initial Biopsy   Diagnosis Rectum, biopsy, mass - INVASIVE ADENOCARCINOMA.  MMR normal  Baseline CEA 3.9     09/17/2021 Imaging   CT CAP IMPRESSION: 1. Known left rectal mass measures approximately 3.3 x 1.9 x 4.1 cm and is associated with asymmetric haziness in the left perirectal fat and small left perirectal lymph nodes. 2. No findings highly suspicious for distant metastatic disease. There are 2 small indeterminate low-density hepatic lesions. Consider MRI for further characterization. 3. No suspicious findings in the chest. Small perifissural nodules bilaterally, likely benign.   09/25/2021 Imaging      Outside liver and pelvic MRI   10/13/2021 Cancer Staging   Staging form: Colon and Rectum, AJCC 8th Edition - Clinical stage from 10/13/2021: Stage IIIB (cT3, cN1, cM0) - Signed by Malachy Mood, MD on 10/23/2021 Stage prefix: Initial diagnosis   10/23/2021 - 02/05/2022 Chemotherapy   Patient is on Treatment Plan : RECTAL Xelox (Capeox) q21d x 6 cycles     02/18/2022 Imaging   CT CHEST ABDOMEN PELVIS W IV CONTRAST   Impression  IMPRESSION:  1.  Soft tissue mass along the left lateral aspect of the rectum and adjacent infiltration of the perirectal fat on the left, measures approximately 2.5 x 3.9 cm.  2.  There is no adenopathy or evidence of  distant metastatic disease.   3.  Hepatic steatosis.       INTERVAL HISTORY:  Bryan [name] is here for a follow up of rectal cancer. Patient being seen in the clinic today by me, he was last seen on 05/14/2022. He present to clinic today alone but has spouse on the phone. Patient went to ER for scan and they found bilateral PE in his lungs . He is now on blood thinner. Patient developed an abscess after the surgery and had the drain removed a month ago. No swelling in lower extremity. Patient has some discharge from rectum on and off.     All other systems were reviewed with the patient and are negative.  MEDICAL HISTORY:  Past Medical History:  Diagnosis Date   Allergic rhinitis, cause unspecified    spring only   Hypercholesteremia    Rectal cancer 09/14/2021    SURGICAL HISTORY: Past Surgical History:  Procedure Laterality Date   DIVERTING ILEOSTOMY N/A 07/01/2022   Procedure: DIVERTING LOOP ILEOSTOMY;  Surgeon: Andria Meuse, MD;  Location: [facility];  Service: General;  Laterality: N/A;   FLEXIBLE SIGMOIDOSCOPY N/A 07/01/2022   Procedure: Arnell Sieving;  Surgeon: Andria Meuse, MD;  Location: [facility];  Service: General;  Laterality: N/A;   FLEXIBLE SIGMOIDOSCOPY N/A 02/04/2023   Procedure: DIAGNOSTIC FLEXIBLE SIGMOIDOSCOPY;  Surgeon: Andria MeuseWhite, Christopher M, MD;  Location: [facility];  Service: General;  Laterality: N/A;   IR CATHETER TUBE CHANGE  10/08/2022   IR CATHETER TUBE CHANGE  12/03/2022   IR FLUORO RM 30-60 MIN  02/11/2023   IR RADIOLOGIST EVAL & MGMT  08/03/2022   IR RADIOLOGIST EVAL & MGMT  08/17/2022   IR RADIOLOGIST EVAL & MGMT  09/02/2022   IR RADIOLOGIST EVAL & MGMT  09/30/2022   None     XI ROBOTIC ASSISTED LOWER ANTERIOR RESECTION N/A 07/01/2022   Procedure: XI ROBOTIC ASSISTED LOWER ANTERIOR RESECTION STAPLED COLOANAL ANASTAMOSIS INTRAOP ASSESSMENT OF PERFUSION AND TAPP BLOCKS;  Surgeon: Andria MeuseWhite, Christopher M, MD;  Location: [facility];  Service: General;   Laterality: N/A;    I have reviewed the social history and family history with the patient and they are unchanged from previous note.  ALLERGIES:  has No Known Allergies.  MEDICATIONS:  Current Outpatient Medications  Medication Sig Dispense Refill   APIXABAN (ELIQUIS) VTE STARTER PACK (10MG  AND 5MG ) Take as directed on package: start with two-5mg  tablets twice daily for 7 days. On day 8, switch to one-5mg  tablet twice daily. 1 each 0   capecitabine (XELODA) 500 MG tablet Take by mouth 2 (two) times daily after a meal.     Cyanocobalamin (B-12 PO) Take 1 tablet by mouth daily.     gabapentin (NEURONTIN) 100 MG capsule Take 200 mg by mouth 3 (three) times daily.     ibuprofen (ADVIL) 800 MG tablet Take 1 tablet (800 mg total) by mouth 2 (two) times daily as needed for moderate pain. (Patient not taking: Reported on 02/03/2023) 30 tablet 0   Melatonin 10 MG TABS Take 10 mg by mouth at bedtime.     pantoprazole (PROTONIX) 40 MG tablet Take 40 mg by mouth daily.     Sodium Chloride Flush (NORMAL SALINE FLUSH) 0.9 % SOLN Flush catheter daily (Patient not taking: Reported on 02/03/2023) 100 mL 0   No current facility-administered medications for this visit.    PHYSICAL EXAMINATION: ECOG PERFORMANCE STATUS: 0 - Asymptomatic  Vitals:   04/04/23 1432  BP: 121/75  Pulse: 83  Resp: 18  Temp: 98.6 F (37 C)  SpO2: 100%   Wt Readings from Last 3 Encounters:  04/04/23 178 lb 14.4 oz (81.1 kg)  03/31/23 176 lb (79.8 kg)  02/04/23 167 lb (75.8 kg)     GENERAL:alert, no distress and comfortable SKIN: skin color, texture, turgor are normal, no rashes or significant lesions EYES: normal, Conjunctiva are pink and non-injected, sclera clear NECK: supple, thyroid normal size, non-tender, without nodularity LYMPH:  no palpable lymphadenopathy in the cervical, axillary  LUNGS: clear to auscultation and percussion with normal breathing effort HEART: regular rate & rhythm and no murmurs and no lower  extremity edema ABDOMEN:abdomen soft, non-tender and normal bowel sounds, (+) ostomy bag Musculoskeletal:no cyanosis of digits and no clubbing  NEURO: alert & oriented x 3 with fluent speech, no focal motor/sensory deficits  LABORATORY DATA:  I have reviewed the data as listed    Latest Ref Rng & Units 03/31/2023   11:00 PM 10/08/2022    8:22 AM 07/15/2022    9:58 PM  CBC  WBC 4.0 - 10.5 K/uL 3.8  3.9  8.8   Hemoglobin 13.0 - 17.0 g/dL 16.114.5  09.612.4  04.511.5   Hematocrit 39.0 - 52.0 % 43.2  37.7  34.4   Platelets 150 - 400 K/uL 250  282  453         Latest Ref Rng & Units 03/31/2023   11:00 PM 10/08/2022    8:22 AM 07/15/2022    9:58 PM  CMP  Glucose 70 - 99 mg/dL 97  112  162   BUN 6 - 20 mg/dL 17  12  15    Creatinine 0.61 - 1.24 mg/dL 4.46  9.50  7.22   Sodium 135 - 145 mmol/L 137  139  136   Potassium 3.5 - 5.1 mmol/L 3.9  4.4  4.5   Chloride 98 - 111 mmol/L 104  108  104   CO2 22 - 32 mmol/L 25  26  24    Calcium 8.9 - 10.3 mg/dL 8.8  9.2  8.7   Total Protein 6.5 - 8.1 g/dL   6.4   Total Bilirubin 0.3 - 1.2 mg/dL   0.5   Alkaline Phos 38 - 126 U/L   97   AST 15 - 41 U/L   17   ALT 0 - 44 U/L   43       RADIOGRAPHIC STUDIES: I have personally reviewed the radiological images as listed and agreed with the findings in the report. No results found.    Orders Placed This Encounter  Procedures   Hemoglobin A1c    Standing Status:   Future    Standing Expiration Date:   04/03/2024   CEA (IN HOUSE-[facility])    Standing Status:   Standing    Number of Occurrences:   5    Standing Expiration Date:   04/03/2024   CBC with Differential/Platelet    Standing Status:   Standing    Number of Occurrences:   50    Standing Expiration Date:   04/03/2024   Comprehensive metabolic panel    Standing Status:   Standing    Number of Occurrences:   50    Standing Expiration Date:   04/03/2024   All questions were answered. The patient knows to call the clinic with any problems, questions or  concerns. No barriers to learning was detected. The total time spent in the appointment was 30 minutes.     Malachy Mood, MD 04/04/2023   I, Sharlette Dense, CMA, am acting as scribe for Malachy Mood, MD.   I have reviewed the above documentation for accuracy and completeness, and I agree with the above.

## 2023-04-05 ENCOUNTER — Other Ambulatory Visit: Payer: Self-pay | Admitting: Hematology

## 2023-04-05 ENCOUNTER — Telehealth: Payer: Self-pay | Admitting: Hematology

## 2023-04-05 ENCOUNTER — Other Ambulatory Visit: Payer: Self-pay

## 2023-04-05 DIAGNOSIS — C20 Malignant neoplasm of rectum: Secondary | ICD-10-CM

## 2023-04-06 ENCOUNTER — Telehealth: Payer: Self-pay | Admitting: Hematology

## 2023-04-06 ENCOUNTER — Other Ambulatory Visit: Payer: Self-pay

## 2023-04-06 NOTE — Telephone Encounter (Signed)
Contacted patient to scheduled appointments. Patient is aware of appointments that are scheduled.   

## 2023-04-07 ENCOUNTER — Telehealth: Payer: Self-pay | Admitting: Hematology

## 2023-04-07 NOTE — Telephone Encounter (Signed)
Contacted patient to scheduled appointments. Patient is aware of appointments that are scheduled.   

## 2023-04-11 ENCOUNTER — Inpatient Hospital Stay: Payer: BC Managed Care – PPO

## 2023-04-11 DIAGNOSIS — C20 Malignant neoplasm of rectum: Secondary | ICD-10-CM

## 2023-04-11 DIAGNOSIS — Z08 Encounter for follow-up examination after completed treatment for malignant neoplasm: Secondary | ICD-10-CM | POA: Diagnosis not present

## 2023-04-11 DIAGNOSIS — R739 Hyperglycemia, unspecified: Secondary | ICD-10-CM

## 2023-04-11 LAB — COMPREHENSIVE METABOLIC PANEL
ALT: 30 U/L (ref 0–44)
AST: 16 U/L (ref 15–41)
Albumin: 4.1 g/dL (ref 3.5–5.0)
Alkaline Phosphatase: 112 U/L (ref 38–126)
Anion gap: 6 (ref 5–15)
BUN: 15 mg/dL (ref 6–20)
CO2: 25 mmol/L (ref 22–32)
Calcium: 9.4 mg/dL (ref 8.9–10.3)
Chloride: 108 mmol/L (ref 98–111)
Creatinine, Ser: 1.24 mg/dL (ref 0.61–1.24)
GFR, Estimated: >60 mL/min (ref >60)
Glucose, Bld: 93 mg/dL (ref 70–99)
Potassium: 3.9 mmol/L (ref 3.5–5.1)
Sodium: 139 mmol/L (ref 135–145)
Total Bilirubin: 0.4 mg/dL (ref 0.3–1.2)
Total Protein: 6.9 g/dL (ref 6.5–8.1)

## 2023-04-11 LAB — CBC WITH DIFFERENTIAL/PLATELET
Abs Immature Granulocytes: 0.01 10*3/uL (ref 0.00–0.07)
Basophils Absolute: 0 10*3/uL (ref 0.0–0.1)
Basophils Relative: 1 %
Eosinophils Absolute: 0.2 10*3/uL (ref 0.0–0.5)
Eosinophils Relative: 5 %
HCT: 42.2 % (ref 39.0–52.0)
Hemoglobin: 14 g/dL (ref 13.0–17.0)
Immature Granulocytes: 0 %
Lymphocytes Relative: 35 %
Lymphs Abs: 1.3 10*3/uL (ref 0.7–4.0)
MCH: 26.3 pg (ref 26.0–34.0)
MCHC: 33.2 g/dL (ref 30.0–36.0)
MCV: 79.2 fL — ABNORMAL LOW (ref 80.0–100.0)
Monocytes Absolute: 0.4 10*3/uL (ref 0.1–1.0)
Monocytes Relative: 11 %
Neutro Abs: 1.8 10*3/uL (ref 1.7–7.7)
Neutrophils Relative %: 48 %
Platelets: 248 10*3/uL (ref 150–400)
RBC: 5.33 MIL/uL (ref 4.22–5.81)
RDW: 13.3 % (ref 11.5–15.5)
WBC: 3.8 10*3/uL — ABNORMAL LOW (ref 4.0–10.5)
nRBC: 0 % (ref 0.0–0.2)

## 2023-04-11 LAB — HEMOGLOBIN A1C
Hgb A1c MFr Bld: 6.2 % — ABNORMAL HIGH (ref 4.8–5.6)
Mean Plasma Glucose: 131.24 mg/dL

## 2023-04-11 LAB — CEA (IN HOUSE-CHCC): CEA (CHCC-In House): 2.32 ng/mL (ref 0.00–5.00)

## 2023-04-18 ENCOUNTER — Other Ambulatory Visit: Payer: Self-pay

## 2023-04-19 ENCOUNTER — Encounter: Payer: Self-pay | Admitting: Hematology

## 2023-04-20 LAB — GUARDANT REVEAL

## 2023-04-25 ENCOUNTER — Telehealth: Payer: BC Managed Care – PPO | Admitting: Hematology

## 2023-05-26 ENCOUNTER — Encounter (HOSPITAL_COMMUNITY): Payer: Self-pay | Admitting: Surgery

## 2023-06-01 NOTE — Anesthesia Preprocedure Evaluation (Addendum)
Anesthesia Evaluation  Patient identified by MRN, date of birth, ID band Patient awake    Reviewed: Allergy & Precautions, NPO status , Patient's Chart, lab work & pertinent test results  Airway Mallampati: II  TM Distance: >3 FB Neck ROM: Full    Dental no notable dental hx. (+) Teeth Intact, Dental Advisory Given   Pulmonary neg pulmonary ROS   Pulmonary exam normal breath sounds clear to auscultation       Cardiovascular + DVT (on Eliquis S/P chemo and Rads for rectal CA)  Normal cardiovascular exam Rhythm:Regular Rate:Normal     Neuro/Psych  negative psych ROS   GI/Hepatic Rectal CA   Endo/Other    Renal/GU      Musculoskeletal   Abdominal   Peds  Hematology   Anesthesia Other Findings   Reproductive/Obstetrics                             Anesthesia Physical Anesthesia Plan  ASA: 3  Anesthesia Plan: MAC   Post-op Pain Management: Minimal or no pain anticipated   Induction:   PONV Risk Score and Plan: Propofol infusion and Treatment may vary due to age or medical condition  Airway Management Planned: Nasal Cannula and Simple Face Mask  Additional Equipment: None  Intra-op Plan:   Post-operative Plan:   Informed Consent: I have reviewed the patients History and Physical, chart, labs and discussed the procedure including the risks, benefits and alternatives for the proposed anesthesia with the patient or authorized representative who has indicated his/her understanding and acceptance.     Dental advisory given  Plan Discussed with:   Anesthesia Plan Comments:         Anesthesia Quick Evaluation

## 2023-06-02 ENCOUNTER — Other Ambulatory Visit: Payer: Self-pay

## 2023-06-02 ENCOUNTER — Encounter (HOSPITAL_COMMUNITY)
Admission: RE | Disposition: A | Discharge status: Home / Self Care | Payer: Self-pay | Source: Home / Self Care | Attending: Surgery

## 2023-06-02 ENCOUNTER — Ambulatory Visit (HOSPITAL_COMMUNITY): Payer: BC Managed Care – PPO | Admitting: Anesthesiology

## 2023-06-02 ENCOUNTER — Encounter (HOSPITAL_COMMUNITY): Payer: Self-pay | Admitting: Surgery

## 2023-06-02 ENCOUNTER — Ambulatory Visit (HOSPITAL_COMMUNITY)
Admission: RE | Admit: 2023-06-02 | Discharge: 2023-06-02 | Disposition: A | Discharge status: Home / Self Care | Payer: BC Managed Care – PPO | Attending: Surgery | Admitting: Surgery

## 2023-06-02 DIAGNOSIS — Z980 Intestinal bypass and anastomosis status: Secondary | ICD-10-CM | POA: Diagnosis not present

## 2023-06-02 DIAGNOSIS — Z9221 Personal history of antineoplastic chemotherapy: Secondary | ICD-10-CM | POA: Insufficient documentation

## 2023-06-02 DIAGNOSIS — Z7901 Long term (current) use of anticoagulants: Secondary | ICD-10-CM | POA: Diagnosis not present

## 2023-06-02 DIAGNOSIS — Z923 Personal history of irradiation: Secondary | ICD-10-CM | POA: Diagnosis not present

## 2023-06-02 DIAGNOSIS — Z08 Encounter for follow-up examination after completed treatment for malignant neoplasm: Secondary | ICD-10-CM | POA: Insufficient documentation

## 2023-06-02 DIAGNOSIS — E7800 Pure hypercholesterolemia, unspecified: Secondary | ICD-10-CM | POA: Insufficient documentation

## 2023-06-02 DIAGNOSIS — Z85048 Personal history of other malignant neoplasm of rectum, rectosigmoid junction, and anus: Secondary | ICD-10-CM | POA: Diagnosis not present

## 2023-06-02 DIAGNOSIS — Z86718 Personal history of other venous thrombosis and embolism: Secondary | ICD-10-CM | POA: Diagnosis not present

## 2023-06-02 HISTORY — PX: FLEXIBLE SIGMOIDOSCOPY: SHX5431

## 2023-06-02 SURGERY — SIGMOIDOSCOPY, FLEXIBLE
Anesthesia: Monitor Anesthesia Care

## 2023-06-02 MED ORDER — PROPOFOL 1000 MG/100ML IV EMUL
INTRAVENOUS | Status: AC
  Filled 2023-06-02: qty 100

## 2023-06-02 MED ORDER — PROPOFOL 500 MG/50ML IV EMUL
INTRAVENOUS | Status: DC | PRN
  Administered 2023-06-02: 120 ug/kg/min via INTRAVENOUS

## 2023-06-02 MED ORDER — PROPOFOL 10 MG/ML IV BOLUS
INTRAVENOUS | Status: DC | PRN
  Administered 2023-06-02: 60 mg via INTRAVENOUS

## 2023-06-02 MED ORDER — LACTATED RINGERS IV SOLN: INTRAVENOUS | Status: DC

## 2023-06-02 NOTE — Op Note (Signed)
[facility] Patient Name: Bryan [name] Procedure Date: 06/02/2023 MRN: [id number] Attending MD: Andria Meuse MD, MD,  Date of Birth: August 16, 1971 CSN: [id number] Age: 52 Admit Type: Outpatient Procedure:                Flexible Sigmoidoscopy Indications:              Personal history of malignant rectal neoplasm,                            Post-operative assessment, Monitoring for                            anastomosis integrity Providers:                Stephanie Coup. Brinton Brandel MD, MD, Lorenza Evangelist, RN Referring MD:              Medicines:                Monitored Anesthesia Care Complications:            No immediate complications. Estimated blood loss:                            Minimal. Estimated Blood Loss:     Estimated blood loss was minimal. Procedure:                Pre-Anesthesia Assessment:                           - Prior to the procedure, a History and Physical                            was performed, and patient medications, allergies                            and sensitivities were reviewed. The patient's                            tolerance of previous anesthesia was reviewed.                           - The risks and benefits of the procedure and the                            sedation options and risks were discussed with the                            patient. All questions were answered and informed                            consent was obtained.                           - Patient identification and proposed procedure                            were verified prior to the procedure by the  physician, the nurse and the anesthetist. The                            procedure was verified in the pre-procedure area in                            the endoscopy suite.                           After obtaining informed consent, the scope was                            passed under direct vision. The [id number]-[id number] ([id number])                             Olympus endoscope was introduced through the anus                            and advanced to the the splenic flexure. The                            flexible sigmoidoscopy was accomplished without                            difficulty. The patient tolerated the procedure                            well. The quality of the bowel preparation was                            adequate. Scope In: 12:11:48 PM Scope Out: 12:18:44 PM Total Procedure Duration: 0 hours 6 minutes 56 seconds  Findings:      A post-surgical anastomosis was found on digital exam. Somewhat       decreased sphincter tone. Anastomosis showed mild stenosis. Pertinent       negatives include no anal lesion or abnormality.      There was evidence of a prior end-to-end colo-colonic anastomosis in the       distal rectum. This was patent and was characterized by friable mucosa       and an intact staple line with sinus on the posterior midline. There is       no mass or neoplastic appearing tissue at the anastomosis. Sinus is       smaller than prior but still apparent (see photos). Anastosis admits tip       of finger but more aggressive dilation digitally was not attempted given       known posterior sinus and him being on Eliquis. Traversed with EGD scope       without difficulty. Estimated blood loss was minimal. Impression:               - Stenosed post-surgical anastomosis found on                            digital exam.                           -  Patent end-to-end colo-colonic anastomosis,                            characterized by friable mucosa and an intact                            staple line.                           - No specimens collected. Moderate Sedation:      Not Applicable - Patient had care per Anesthesia. Recommendation:           - Continue present medications.                           - Resume previous diet indefinitely. Procedure Code(s):        --- Professional ---                            [phone], Sigmoidoscopy, flexible; diagnostic,                            including collection of specimen(s) by brushing or                            washing, when performed (separate procedure) Diagnosis Code(s):        --- Professional ---                           Z98.0, Intestinal bypass and anastomosis status                           Z85.048, Personal history of other malignant                            neoplasm of rectum, rectosigmoid junction, and anus                           Z09, Encounter for follow-up examination after                            completed treatment for conditions other than                            malignant neoplasm CPT copyright 2022 [employer]. All rights reserved. The codes documented in this report are preliminary and upon coder review may  be revised to meet current compliance requirements. Marin Olp, MD Andria Meuse MD, MD 06/02/2023 12:44:24 PM This report has been signed electronically. Number of Addenda: 0

## 2023-06-02 NOTE — Transfer of Care (Signed)
Immediate Anesthesia Transfer of Care Note  Patient: Bryan [name]  Procedure(s) Performed: FLEXIBLE SIGMOIDOSCOPY- DIAGNOSTIC  Patient Location: PACU and Endoscopy Unit  Anesthesia Type:MAC  Level of Consciousness: awake and drowsy  Airway & Oxygen Therapy: Patient Spontanous Breathing  Post-op Assessment: Report given to RN and Post -op Vital signs reviewed and stable  Post vital signs: Reviewed and stable  Last Vitals:  Vitals Value Taken Time  BP    Temp    Pulse    Resp    SpO2      Last Pain:  Vitals:   06/02/23 1024  TempSrc: Temporal  PainSc: 0-No pain         Complications: No notable events documented.

## 2023-06-02 NOTE — Anesthesia Postprocedure Evaluation (Signed)
Anesthesia Post Note  Patient: Bryan [name]  Procedure(s) Performed: FLEXIBLE SIGMOIDOSCOPY- DIAGNOSTIC     Patient location during evaluation: Endoscopy Anesthesia Type: MAC Level of consciousness: awake and alert Pain management: pain level controlled Vital Signs Assessment: post-procedure vital signs reviewed and stable Respiratory status: spontaneous breathing, nonlabored ventilation, respiratory function stable and patient connected to nasal cannula oxygen Cardiovascular status: blood pressure returned to baseline and stable Postop Assessment: no apparent nausea or vomiting Anesthetic complications: no   No notable events documented.  Last Vitals:  Vitals:   06/02/23 1245 06/02/23 1250  BP: 118/73 117/82  Pulse: 70 63  Resp: 16 16  Temp:    SpO2: 100% 100%    Last Pain:  Vitals:   06/02/23 1250  TempSrc:   PainSc: 0-No pain                 Trevor Iha

## 2023-06-02 NOTE — H&P (Signed)
CC: Here today for flexible sigmoidoscopy  HPI: Bryan [name] is an 52 y.o. male with history of HLD, whom is seen in the office today as a referral by Dr. Seymour Bars for evaluation of newly diagnosed rectal cancer.   Colonoscopy for hematochezia and rectal pain 09/14/2021 demonstrated for mass in the rectum. Left side. Ulcerated appearance. This was biopsied. Exam otherwise normal. Biopsies returned invasive adenocarcinoma. IHC preserved.  CEA 3.9   CT CAP 09/18/2021 showed the known left rectal mass 3.3 x 1.9 x 4.1 cm. Associated asymmetric haziness in the left perirectal fat with small perirectal lymph nodes. No findings highly suspicious for distant metastatic disease however there are 2 small indeterminate low-density liver lesions. No suspicious findings in the chest. Small perifissural nodules bilaterally, likely benign.  MRI Abdomen (liver) and pelvis (rectal cancer) have not been completed.  He denies any specific complaints today. He reports he will have intermittent pelvic/rectal pains. He will occasionally have some bright red blood per rectum.  He had an MRI abdomen and MRI/MRA of his pelvis completed 09/30/2021 at an outside facility due to some coordination issues with regards to his ability to obtain these studies under insurance coverage. This MRI was not completed with a rectal cancer protocol. It is therefore the recommendation of Dr. Mosetta Putt that he undergo a repeat MRI.  Outside MRI Abd 09/30/21 -7 mm T2 high signal lesion noted anteriorly in the right lobe of the liver. This lesion is too small to adequately characterize but there is suggestion of peripheral nodular enhancement as may be seen with a small hemangioma.  Outside MRI P + MRA 09/30/21 -"rectal mass correlating with known rectal carcinoma with asymmetric wall thickening, greater on the left. Left border of the rectal mass is indistinct with mild hazy appearance of the adjacent mesorectum. This appearance could be reflective of  motion artifact. However there is suspicion of mild tumor extension into the left aspect of the mesorectum. Tumor additionally narrows the rectal lumen at the site of the mass. Negative for definite significant abdominal or pelvic lymphadenopathy."  Re-read of staging pelvic MRI on 10/13/21 showed stage T3b, N1 disease, with contact with internal sphincter.  He began neoadjuvant chemo with Capox on 10/23/21  He completed chemo/radiation 04/13/2022.  CT CAP on 02/18/22 at [facility] showed the rectal mass measuring 3.9 cm, a little smaller in size, but no significant shrinkage. No adenopathy or metastatic disease.   OR 07/01/22 - Robotic ultra-low anterior resection, flex sig, diverting loop ileostomy PATH - ypT2N0 margins uninvolved; 0/18 LNs. Well differentiated, mucinous component. No LVI/PNI.  He recovered reasonably well, postop ileus but this resolved.  He was readmitted 07/16/2022 with abdominal pain and decreased appetite. He had a normal Atalia Litzinger count. CT demonstrated a now complex gas fluid collection in the pelvis compatible with "postoperative abscess." JP drain had recently been removed.  IR was consulted and he underwent percutaneous drain placement. Had rapid resolution of all of the symptoms. He was discharged.  IR drain study 08/03/22 - 1. Positive for fistulous communication between the drainage catheter in the adjacent colo enteric anastomosis. 2. Resolved abscess.  IR drain study 09/02/22 - persistent fistulous connection  IR drain study 10/08/22 - exchanged for 10 Fr over wire, follow-up planned 12/03/22  IR Drain study 12/03/22 - Pelvic abscessogram demonstrates continued fistulous communication with bowel. Existing drain replaced with new 10.2 [place] multipurpose pigtail catheter.  Drain previously 20cc/day but now having some days worse than single digits. Still has somewhat of a  foul odor he notes. Often times somewhat of a brownish-green tinge. Doing much better overall from a  discomfort standpoint the drain. No fever or chills. Ileostomy working well. Good appetite. Minimal drainage per anus.   INTERVAL HX Denies any changes in health or health history - drain remains out.   Here today for flexible sigmoidoscopy.  He has been doing quite well. Still with some drainage per rectum and will wear pad at work.   PMH: HLD  PSH: Denies  FHx: His mother had breast cancer in her 28s. He has some cousins with unclear malignancies on paternal side. He denies any other known family history of colorectal, breast, endometrial or ovarian cancer  Social Hx: Denies use of tobacco/EtOH/illicit drug. He works for [employer]. He is here today with his wife.  Past Medical History:  Diagnosis Date   Allergic rhinitis, cause unspecified    spring only   Hypercholesteremia    Rectal cancer (HCC) 09/14/2021    Past Surgical History:  Procedure Laterality Date   DIVERTING ILEOSTOMY N/A 07/01/2022   Procedure: DIVERTING LOOP ILEOSTOMY;  Surgeon: Andria Meuse, MD;  Location: [facility];  Service: General;  Laterality: N/A;   FLEXIBLE SIGMOIDOSCOPY N/A 07/01/2022   Procedure: FLEXIBLE SIGMOIDOSCOPY;  Surgeon: Andria Meuse, MD;  Location: [facility];  Service: General;  Laterality: N/A;   FLEXIBLE SIGMOIDOSCOPY N/A 02/04/2023   Procedure: DIAGNOSTIC FLEXIBLE SIGMOIDOSCOPY;  Surgeon: Andria Meuse, MD;  Location: [facility];  Service: General;  Laterality: N/A;   IR CATHETER TUBE CHANGE  10/08/2022   IR CATHETER TUBE CHANGE  12/03/2022   IR FLUORO RM 30-60 MIN  02/11/2023   IR RADIOLOGIST EVAL & MGMT  08/03/2022   IR RADIOLOGIST EVAL & MGMT  08/17/2022   IR RADIOLOGIST EVAL & MGMT  09/02/2022   IR RADIOLOGIST EVAL & MGMT  09/30/2022   None     XI ROBOTIC ASSISTED LOWER ANTERIOR RESECTION N/A 07/01/2022   Procedure: XI ROBOTIC ASSISTED LOWER ANTERIOR RESECTION STAPLED COLOANAL ANASTAMOSIS INTRAOP ASSESSMENT OF PERFUSION AND TAPP BLOCKS;  Surgeon: Andria Meuse, MD;  Location: [facility];  Service: General;  Laterality: N/A;    Family History  Problem Relation Age of Onset   Cancer Mother 40       Breast   Alcohol abuse Father    Cancer Paternal Aunt        breast   Breast cancer Paternal Aunt    Diabetes Paternal Aunt    Heart disease Paternal Aunt    Breast cancer Cousin    Cancer Paternal Great-grandfather        prostate   Prostate cancer Paternal Great-grandfather    Hypertension Neg Hx    Colon polyps Neg Hx    Esophageal cancer Neg Hx    Rectal cancer Neg Hx    Stomach cancer Neg Hx     Social:  reports that he has never smoked. He has never used smokeless tobacco. He reports that he does not drink alcohol and does not use drugs.  Allergies: No Known Allergies  Medications: I have reviewed the patient's current medications.  No results found for this or any previous visit (from the past 48 hour(s)).  No results found.  ROS - all of the below systems have been reviewed with the patient and positives are indicated with bold text General: chills, fever or night sweats Eyes: blurry vision or double vision ENT: epistaxis or sore throat Allergy/Immunology: itchy/watery eyes or nasal congestion Hematologic/Lymphatic:  bleeding problems, blood clots or swollen lymph nodes Endocrine: temperature intolerance or unexpected weight changes Breast: new or changing breast lumps or nipple discharge Resp: cough, shortness of breath, or wheezing CV: chest pain or dyspnea on exertion GI: as per HPI GU: dysuria, trouble voiding, or hematuria MSK: joint pain or joint stiffness Neuro: TIA or stroke symptoms Derm: pruritus and skin lesion changes Psych: anxiety and depression  PE Blood pressure 117/81, pulse 68, temperature 97.6 F (36.4 C), temperature source Temporal, resp. rate 14, height 5\' 10"  (1.778 m), weight 74.4 kg, SpO2 100 %. Constitutional: NAD; conversant Eyes: Moist conjunctiva Lungs: Normal respiratory effort CV: RRR GI: Abd soft,  ND Psychiatric: Appropriate affect  No results found for this or any previous visit (from the past 48 hour(s)).  No results found.   A/P: Bryan [name] is an 52 y.o. male with hx of HLD here for follow-up flexible sigmoidoscopy; hx rectal cancer now s/p TNT And surgery - ultra-low anterior resection, DLI 07/01/22, c/b postop abscess vs anastomotic leak  cT3N1M0 low rectal cancer - completed TNT - neoadj cXRT was completed 04/13/22  ypT2N0M0 (0/18 LNs); margins uninvolved  -Following with medical oncology for surveillance as well + anticoagulation for evident pulmonary emboli  -We discussed next steps. Will go ahead and plan for flexible sigmoidoscopy with exam under anesthesia today -The planned procedure, material risks (including, but not limited to, pain, bleeding, perforation, need for additional procedures) benefits and alternatives were described. All of his questions were answered to his satisfaction, he expressed understanding, and agreement with the plan. Consent granted to proceed again today.  Marin Olp, MD [facility]

## 2023-06-07 ENCOUNTER — Encounter (HOSPITAL_COMMUNITY): Payer: Self-pay | Admitting: Surgery

## 2023-06-28 ENCOUNTER — Other Ambulatory Visit (HOSPITAL_COMMUNITY): Payer: Self-pay | Admitting: Surgery

## 2023-06-28 DIAGNOSIS — C20 Malignant neoplasm of rectum: Secondary | ICD-10-CM

## 2023-07-04 ENCOUNTER — Ambulatory Visit
Admission: RE | Admit: 2023-07-04 | Discharge: 2023-07-04 | Disposition: A | Discharge status: Home / Self Care | Payer: BC Managed Care – PPO | Source: Ambulatory Visit | Attending: Surgery | Admitting: Surgery

## 2023-07-04 DIAGNOSIS — C20 Malignant neoplasm of rectum: Secondary | ICD-10-CM | POA: Diagnosis present

## 2023-07-27 ENCOUNTER — Other Ambulatory Visit (HOSPITAL_COMMUNITY): Payer: Self-pay

## 2023-08-01 ENCOUNTER — Inpatient Hospital Stay: Payer: BC Managed Care – PPO | Attending: Hematology

## 2023-08-01 ENCOUNTER — Other Ambulatory Visit: Payer: Self-pay

## 2023-08-01 ENCOUNTER — Ambulatory Visit (HOSPITAL_COMMUNITY)
Admission: RE | Admit: 2023-08-01 | Discharge: 2023-08-01 | Disposition: A | Discharge status: Home / Self Care | Payer: BC Managed Care – PPO | Source: Ambulatory Visit | Attending: Hematology | Admitting: Hematology

## 2023-08-01 DIAGNOSIS — Z7901 Long term (current) use of anticoagulants: Secondary | ICD-10-CM | POA: Diagnosis not present

## 2023-08-01 DIAGNOSIS — Z08 Encounter for follow-up examination after completed treatment for malignant neoplasm: Secondary | ICD-10-CM | POA: Insufficient documentation

## 2023-08-01 DIAGNOSIS — Z85048 Personal history of other malignant neoplasm of rectum, rectosigmoid junction, and anus: Secondary | ICD-10-CM | POA: Insufficient documentation

## 2023-08-01 DIAGNOSIS — G620 Drug-induced polyneuropathy: Secondary | ICD-10-CM | POA: Diagnosis not present

## 2023-08-01 DIAGNOSIS — C20 Malignant neoplasm of rectum: Secondary | ICD-10-CM | POA: Insufficient documentation

## 2023-08-01 DIAGNOSIS — Z86711 Personal history of pulmonary embolism: Secondary | ICD-10-CM | POA: Insufficient documentation

## 2023-08-01 LAB — CBC WITH DIFFERENTIAL/PLATELET
Abs Immature Granulocytes: 0.01 10*3/uL (ref 0.00–0.07)
Basophils Absolute: 0.1 10*3/uL (ref 0.0–0.1)
Basophils Relative: 1 %
Eosinophils Absolute: 0.3 10*3/uL (ref 0.0–0.5)
Eosinophils Relative: 8 %
HCT: 44.7 % (ref 39.0–52.0)
Hemoglobin: 14.8 g/dL (ref 13.0–17.0)
Immature Granulocytes: 0 %
Lymphocytes Relative: 34 %
Lymphs Abs: 1.4 10*3/uL (ref 0.7–4.0)
MCH: 26.3 pg (ref 26.0–34.0)
MCHC: 33.1 g/dL (ref 30.0–36.0)
MCV: 79.4 fL — ABNORMAL LOW (ref 80.0–100.0)
Monocytes Absolute: 0.4 10*3/uL (ref 0.1–1.0)
Monocytes Relative: 9 %
Neutro Abs: 1.9 10*3/uL (ref 1.7–7.7)
Neutrophils Relative %: 48 %
Platelets: 235 10*3/uL (ref 150–400)
RBC: 5.63 MIL/uL (ref 4.22–5.81)
RDW: 13.7 % (ref 11.5–15.5)
WBC: 4 10*3/uL (ref 4.0–10.5)
nRBC: 0 % (ref 0.0–0.2)

## 2023-08-01 LAB — CEA (IN HOUSE-CHCC): CEA (CHCC-In House): 2.78 ng/mL (ref 0.00–5.00)

## 2023-08-01 LAB — COMPREHENSIVE METABOLIC PANEL
ALT: 37 U/L (ref 0–44)
AST: 37 U/L (ref 15–41)
Albumin: 4.4 g/dL (ref 3.5–5.0)
Alkaline Phosphatase: 96 U/L (ref 38–126)
Anion gap: 7 (ref 5–15)
BUN: 15 mg/dL (ref 6–20)
CO2: 27 mmol/L (ref 22–32)
Calcium: 9.2 mg/dL (ref 8.9–10.3)
Chloride: 104 mmol/L (ref 98–111)
Creatinine, Ser: 1.34 mg/dL — ABNORMAL HIGH (ref 0.61–1.24)
GFR, Estimated: >60 mL/min (ref >60)
Glucose, Bld: 117 mg/dL — ABNORMAL HIGH (ref 70–99)
Potassium: 4.3 mmol/L (ref 3.5–5.1)
Sodium: 138 mmol/L (ref 135–145)
Total Bilirubin: 0.6 mg/dL (ref 0.3–1.2)
Total Protein: 7.1 g/dL (ref 6.5–8.1)

## 2023-08-01 MED ORDER — IOHEXOL 300 MG/ML  SOLN
100.0000 mL | Freq: Once | INTRAMUSCULAR | Status: AC | PRN
  Administered 2023-08-01: 100 mL via INTRAVENOUS

## 2023-08-04 ENCOUNTER — Ambulatory Visit: Payer: BC Managed Care – PPO | Admitting: Hematology

## 2023-08-07 DIAGNOSIS — G620 Drug-induced polyneuropathy: Secondary | ICD-10-CM | POA: Insufficient documentation

## 2023-08-07 DIAGNOSIS — Z86711 Personal history of pulmonary embolism: Secondary | ICD-10-CM | POA: Insufficient documentation

## 2023-08-07 NOTE — Assessment & Plan Note (Signed)
-  secondary to oxaliplatin -he endorses taking vit B complex. -stable, no functional deficits. He declines gabapentin for now.

## 2023-08-07 NOTE — Assessment & Plan Note (Addendum)
MMR normal, cT3b N1 M0, ypT2N0 -Diagnosed in October 2022 -Baseline CEA normal 3.9. -he received total neoadjuvant chemo and radiation from 09/2022-03/2023 --He underwent robotic assisted low anterior resection by Dr. Cliffton Asters on July 01, 2022.  I reviewed his surgical pathology findings, margins were negative.  No adjuvant chemotherapy needed. -Surveillance CT scan from August 04, 2023 showed stable diffuse soft tissue thickening in the presacral space, which was evaluated by MRI in July 2024, no concern for local recurrence.  CT scan also showed a 10 mm nodule in lung, will get a CT chest in next few weeks and repeat CT scan in 2 to 3 months. -continue caner surveillance, he is clinically doing well

## 2023-08-07 NOTE — Assessment & Plan Note (Signed)
This was incidental finding on his surveillance CT scan on March 31, 2023, he was asymptomatic.  Workup was negative for cancer recurrence at that time. --Will recommend at least 6 months of anticoagulation, he is tolerating Eliquis well

## 2023-08-08 ENCOUNTER — Other Ambulatory Visit: Payer: Self-pay

## 2023-08-08 ENCOUNTER — Inpatient Hospital Stay (HOSPITAL_BASED_OUTPATIENT_CLINIC_OR_DEPARTMENT_OTHER): Payer: BC Managed Care – PPO | Admitting: Hematology

## 2023-08-08 ENCOUNTER — Encounter: Payer: Self-pay | Admitting: Hematology

## 2023-08-08 VITALS — BP 116/79 | HR 66 | Temp 98.3°F | Resp 18 | Ht 70.0 in | Wt 166.5 lb

## 2023-08-08 DIAGNOSIS — G620 Drug-induced polyneuropathy: Secondary | ICD-10-CM | POA: Diagnosis not present

## 2023-08-08 DIAGNOSIS — I2699 Other pulmonary embolism without acute cor pulmonale: Secondary | ICD-10-CM

## 2023-08-08 DIAGNOSIS — C20 Malignant neoplasm of rectum: Secondary | ICD-10-CM | POA: Diagnosis not present

## 2023-08-08 DIAGNOSIS — Z86711 Personal history of pulmonary embolism: Secondary | ICD-10-CM | POA: Diagnosis not present

## 2023-08-08 DIAGNOSIS — Z08 Encounter for follow-up examination after completed treatment for malignant neoplasm: Secondary | ICD-10-CM | POA: Diagnosis not present

## 2023-08-08 DIAGNOSIS — T451X5A Adverse effect of antineoplastic and immunosuppressive drugs, initial encounter: Secondary | ICD-10-CM

## 2023-08-08 NOTE — Progress Notes (Signed)
[facility]   [phone] [phone]   Clinic Follow up Note   Patient Care Team: Tysinger, Kermit Balo, PA-C as PCP - General (Family Medicine) Andria Meuse, MD as Consulting Physician (General Surgery) Malachy Mood, MD as Consulting Physician (Hematology) Dorothy Puffer, MD as Consulting Physician (Radiation Oncology) Pollyann Samples, NP as Nurse Practitioner (Nurse Practitioner) Marily Lente, RN as Registered Nurse Carmina Miller, MD as Consulting Physician (Radiation Oncology) Weyman Croon Radiology, MD as Rounding Team (Interventional Radiology)  Date of Service:  08/08/2023  CHIEF COMPLAINT: f/u of rectal cancer   CURRENT THERAPY:  Cancer surveillance  ASSESSMENT:  Bryan [name] is a 52 y.o. male with   Rectal cancer (HCC) MMR normal, cT3b N1 M0, ypT2N0 -Diagnosed in October 2022 -Baseline CEA normal 3.9. -he received total neoadjuvant chemo and radiation from 09/2022-03/2023 --He underwent robotic assisted low anterior resection by Dr. Cliffton Asters on July 01, 2022.  I reviewed his surgical pathology findings, margins were negative.  No adjuvant chemotherapy needed. -Surveillance CT scan from August 04, 2023 showed stable diffuse soft tissue thickening in the presacral space, which was evaluated by MRI in July 2024, no concern for local recurrence.  CT scan also showed a 10 mm nodule in lung, will get a CT chest in a month (with PE protocol to see if there is any residual PE). -continue caner surveillance, he is clinically doing well  -He is scheduled to see his surgeon Dr. Cliffton Asters later this month, to discuss colostomy reversal.  History of pulmonary embolism This was incidental finding on his surveillance CT scan on March 31, 2023, he was asymptomatic.  Workup was negative for cancer recurrence at that time. 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 -stable, no functional deficits. He declines gabapentin for now.    PLAN: - reviewed CT results from 08/04/2023 -will order CTchest for PE prodical in 1 month to f/u PE and lung nodule  -phone visit after CT -lab and f/u in 4 months  -He is due for colonoscopy, will reach out to GI Dr. Tomasa Rand      SUMMARY OF ONCOLOGIC HISTORY: Oncology History  Rectal cancer (HCC)  08/18/2021 Initial Diagnosis   Rectal cancer (HCC)   09/14/2021 Procedure   Colonoscopy by Dr. Tiajuana Amass -The perianal examination was normal. - The digital rectal exam revealed a firm rectal mass palpated 1-2 cm from the anal verge. The mass was non-circumferential and located predominantly at the left bowel wall. - An ulcerated non-obstructing medium-sized friable mass was found in the distal rectum. The mass was non-circumferential. The mass measured three cm in length. No bleeding was present. Biopsies (5 passes total for 10 total biopsy specimens)were taken with a cold forceps for histology. Estimated blood loss was minimal. - The exam was otherwise normal throughout the examined colon. - The terminal ileum appeared normal. - No additional abnormalities were found on retroflexion   09/14/2021 Initial Biopsy   Diagnosis Rectum, biopsy, mass - INVASIVE ADENOCARCINOMA.  MMR normal  Baseline CEA 3.9     09/17/2021 Imaging   CT CAP IMPRESSION: 1. Known left rectal mass measures approximately 3.3 x 1.9 x 4.1 cm and is associated with asymmetric haziness in the left perirectal fat and small left perirectal lymph nodes. 2. No findings highly suspicious for distant metastatic disease. There are 2 small indeterminate low-density hepatic lesions.  Consider MRI for further characterization. 3. No suspicious findings in the chest. Small perifissural nodules bilaterally, likely benign.   09/25/2021 Imaging      Outside liver and pelvic  MRI   10/13/2021 Cancer Staging   Staging form: Colon and Rectum, AJCC 8th Edition - Clinical stage from 10/13/2021: Stage IIIB (cT3, cN1, cM0) - Signed by Malachy Mood, MD on 10/23/2021 Stage prefix: Initial diagnosis   10/23/2021 - 02/05/2022 Chemotherapy   Patient is on Treatment Plan : RECTAL Xelox (Capeox) q21d x 6 cycles     02/18/2022 Imaging   CT CHEST ABDOMEN PELVIS W IV CONTRAST   Impression  IMPRESSION:  1.  Soft tissue mass along the left lateral aspect of the rectum and adjacent infiltration of the perirectal fat on the left, measures approximately 2.5 x 3.9 cm.  2.  There is no adenopathy or evidence of distant metastatic disease.   3.  Hepatic steatosis.       INTERVAL HISTORY:  Bryan [name] is here for a follow up of rectal cancer. Patient being seen in the clinic today by me, he was last seen on 04/04/2023. He present to clinic with his wife. Patient has lost weight since his last visit. He found out he was a diabetic and has changed diet.    All other systems were reviewed with the patient and are negative.  MEDICAL HISTORY:  Past Medical History:  Diagnosis Date   Allergic rhinitis, cause unspecified    spring only   Hypercholesteremia    Rectal cancer (HCC) 09/14/2021    SURGICAL HISTORY: Past Surgical History:  Procedure Laterality Date   DIVERTING ILEOSTOMY N/A 07/01/2022   Procedure: DIVERTING LOOP ILEOSTOMY;  Surgeon: Andria Meuse, MD;  Location: [facility];  Service: General;  Laterality: N/A;   FLEXIBLE SIGMOIDOSCOPY N/A 07/01/2022   Procedure: FLEXIBLE SIGMOIDOSCOPY;  Surgeon: Andria Meuse, MD;  Location: [facility];  Service: General;  Laterality: N/A;   FLEXIBLE SIGMOIDOSCOPY N/A 02/04/2023   Procedure: DIAGNOSTIC FLEXIBLE SIGMOIDOSCOPY;  Surgeon: Andria Meuse, MD;  Location: [facility];  Service: General;  Laterality: N/A;   FLEXIBLE SIGMOIDOSCOPY N/A 06/02/2023   Procedure: FLEXIBLE SIGMOIDOSCOPY- DIAGNOSTIC;  Surgeon: Andria Meuse, MD;  Location: [facility];  Service: General;  Laterality: N/A;   IR CATHETER TUBE CHANGE  10/08/2022   IR CATHETER TUBE CHANGE  12/03/2022   IR FLUORO RM 30-60 MIN  02/11/2023   IR RADIOLOGIST EVAL & MGMT  08/03/2022   IR RADIOLOGIST EVAL & MGMT  08/17/2022   IR RADIOLOGIST EVAL & MGMT  09/02/2022   IR RADIOLOGIST EVAL & MGMT  09/30/2022   None     XI ROBOTIC ASSISTED LOWER ANTERIOR RESECTION N/A 07/01/2022   Procedure: XI ROBOTIC ASSISTED LOWER ANTERIOR RESECTION STAPLED COLOANAL ANASTAMOSIS INTRAOP ASSESSMENT OF PERFUSION AND TAPP BLOCKS;  Surgeon: Andria Meuse, MD;  Location: [facility];  Service: General;  Laterality: N/A;    I have reviewed the social history and family history with the patient and they are unchanged from previous note.  ALLERGIES:  has No Known Allergies.  MEDICATIONS:  Current Outpatient Medications  Medication Sig Dispense Refill   apixaban (ELIQUIS) 5 MG TABS tablet Take 1 tablet (5 mg total) by mouth 2 (two) times daily. 60 tablet 4   APIXABAN (ELIQUIS) VTE STARTER PACK (10MG  AND 5MG ) Take as directed on package: start with two-5mg  tablets twice daily for 7 days. On day 8, switch to one-5mg  tablet twice daily. (Patient not taking:  Reported on 05/31/2023) 1 each 0   Cyanocobalamin (B-12) 5000 MCG CAPS Take 5,000 mcg by mouth daily.     No current facility-administered medications for this visit.    PHYSICAL EXAMINATION: ECOG PERFORMANCE STATUS: 0 - Asymptomatic  Vitals:   08/08/23 1435  BP: 116/79  Pulse: 66  Resp: 18  Temp: 98.3 F (36.8 C)  SpO2: 98%   Wt Readings from Last 3 Encounters:  08/08/23 166 lb 8 oz (75.5 kg)  06/02/23 164 lb (74.4 kg)  04/04/23 178 lb 14.4 oz (81.1 kg)     GENERAL:alert, no distress and comfortable SKIN: skin color, texture, turgor are normal, no rashes or significant lesions EYES: normal, Conjunctiva are pink and non-injected, sclera clear NECK: supple, thyroid normal size, non-tender, without  nodularity LYMPH:  no palpable lymphadenopathy in the cervical, axillary  LUNGS: clear to auscultation and percussion with normal breathing effort HEART: regular rate & rhythm and no murmurs and no lower extremity edema ABDOMEN:abdomen soft, non-tender and normal bowel sounds Musculoskeletal:no cyanosis of digits and no clubbing  NEURO: alert & oriented x 3 with fluent speech, no focal motor/sensory deficits  LABORATORY DATA:  I have reviewed the data as listed    Latest Ref Rng & Units 08/01/2023   12:32 PM 04/11/2023    1:09 PM 03/31/2023   11:00 PM  CBC  WBC 4.0 - 10.5 K/uL 4.0  3.8  3.8   Hemoglobin 13.0 - 17.0 g/dL 57.3  22.0  25.4   Hematocrit 39.0 - 52.0 % 44.7  42.2  43.2   Platelets 150 - 400 K/uL 235  248  250         Latest Ref Rng & Units 08/01/2023   12:32 PM 04/11/2023    1:09 PM 03/31/2023   11:00 PM  CMP  Glucose 70 - 99 mg/dL 270  93  97   BUN 6 - 20 mg/dL 15  15  17    Creatinine 0.61 - 1.24 mg/dL 6.23  7.62  8.31   Sodium 135 - 145 mmol/L 138  139  137   Potassium 3.5 - 5.1 mmol/L 4.3  3.9  3.9   Chloride 98 - 111 mmol/L 104  108  104   CO2 22 - 32 mmol/L 27  25  25    Calcium 8.9 - 10.3 mg/dL 9.2  9.4  8.8   Total Protein 6.5 - 8.1 g/dL 7.1  6.9    Total Bilirubin 0.3 - 1.2 mg/dL 0.6  0.4    Alkaline Phos 38 - 126 U/L 96  112    AST 15 - 41 U/L 37  16    ALT 0 - 44 U/L 37  30        RADIOGRAPHIC STUDIES: I have personally reviewed the radiological images as listed and agreed with the findings in the report. No results found.    Orders Placed This Encounter  Procedures   CT Chest W Contrast    Standing Status:   Future    Standing Expiration Date:   08/07/2024    Order Specific Question:   If indicated for the ordered procedure, I authorize the administration of contrast media per Radiology protocol    Answer:   Yes    Order Specific Question:   Does the patient have a contrast media/X-ray dye allergy?    Answer:   No    Order Specific Question:    Preferred imaging location?    Answer:   [facility]  All questions were answered. The patient knows to call the clinic with any problems, questions or concerns. No barriers to learning was detected. The total time spent in the appointment was 30 minutes.     Malachy Mood, MD 08/08/2023   I, Sharlette Dense, CMA, am acting as scribe for Malachy Mood, MD.   I have reviewed the above documentation for accuracy and completeness, and I agree with the above.

## 2023-08-09 ENCOUNTER — Telehealth: Payer: Self-pay | Admitting: Hematology

## 2023-08-09 ENCOUNTER — Telehealth: Payer: Self-pay

## 2023-08-09 ENCOUNTER — Telehealth: Payer: Self-pay | Admitting: Gastroenterology

## 2023-08-09 NOTE — Telephone Encounter (Signed)
Left VM for patient to return call and schedule Colonoscopy.

## 2023-08-09 NOTE — Telephone Encounter (Signed)
Message received from patient's oncologist about scheduling surveillance colonoscopy. This patient has a history of rectal cancer status post neoadjuvant chemo/XRT, who underwent low anterior resection with primary anastomosis and diverting loop ileostomy last year. Surgery complicated by fistula formation/leak.  Currently doing well, but has anastomotic stricture which has been traversable with a standard EGD scope by Dr. Cliffton Asters.  Will schedule patient for surveillance colonoscopy.  As the patient still has a diverting loop ileostomy, he does not need to undergo a bowel prep.  We will plan to do his colonoscopy with an ultraslim colonoscope given the presence of the anastomotic stricture at the level of the anus.  He takes Eliquis for history of pulmonary embolism diagnosed in April, currently planning for at least 6 months of anticoagulation.  Will plan to hold this for 2 days.

## 2023-08-10 ENCOUNTER — Telehealth: Payer: Self-pay

## 2023-08-10 NOTE — Telephone Encounter (Signed)
Left VM for patient to return call to schedule Colonosopy

## 2023-08-10 NOTE — Telephone Encounter (Signed)
Left VM for patients wife to let him know to call and schedule Colonoscopy.

## 2023-08-11 ENCOUNTER — Other Ambulatory Visit: Payer: Self-pay

## 2023-08-11 DIAGNOSIS — C20 Malignant neoplasm of rectum: Secondary | ICD-10-CM

## 2023-08-11 NOTE — Telephone Encounter (Signed)
Contacted patient he is scheduled for a colonoscopy 08/26/23 @ 4pm

## 2023-08-21 ENCOUNTER — Other Ambulatory Visit: Payer: Self-pay | Admitting: Medical

## 2023-08-21 DIAGNOSIS — K6289 Other specified diseases of anus and rectum: Secondary | ICD-10-CM

## 2023-08-25 ENCOUNTER — Other Ambulatory Visit: Payer: Self-pay

## 2023-08-25 DIAGNOSIS — I2699 Other pulmonary embolism without acute cor pulmonale: Secondary | ICD-10-CM

## 2023-08-25 NOTE — Progress Notes (Signed)
Received a call from Tinia w/[facility] regarding pt's CT Scan Dr. Mosetta Putt ordered.  Tinia stated that pt's CT Scan has to be done through [facility] in order for pt's insurance to approve and pay for the CT Scan.  Pt and pt's spouse are aware and provider [facility] Dr. Latanya Maudlin office number.  Modified original order to state external.  Faxed order to Attention Tinia w/[facility] [phone]  [phone].  Fax confirmation received.

## 2023-08-26 ENCOUNTER — Encounter: Payer: BC Managed Care – PPO | Admitting: Gastroenterology

## 2023-09-02 ENCOUNTER — Other Ambulatory Visit: Payer: Self-pay

## 2023-09-02 DIAGNOSIS — I2699 Other pulmonary embolism without acute cor pulmonale: Secondary | ICD-10-CM

## 2023-09-07 ENCOUNTER — Encounter: Payer: Self-pay | Admitting: Gastroenterology

## 2023-09-12 ENCOUNTER — Telehealth: Payer: Self-pay | Admitting: Hematology

## 2023-09-12 ENCOUNTER — Other Ambulatory Visit: Payer: Self-pay

## 2023-09-12 ENCOUNTER — Inpatient Hospital Stay: Payer: BC Managed Care – PPO | Admitting: Hematology

## 2023-09-12 ENCOUNTER — Telehealth: Payer: Self-pay

## 2023-09-12 DIAGNOSIS — C20 Malignant neoplasm of rectum: Secondary | ICD-10-CM

## 2023-09-12 NOTE — Assessment & Plan Note (Signed)
-  cT3b N1 M0, ypT2N0, MMR normal, -Diagnosed in October 2022 -Baseline CEA normal 3.9. -he received total neoadjuvant chemo and radiation from 09/2022-03/2023 --He underwent robotic assisted low anterior resection by Dr. Cliffton Asters on July 01, 2022.  Surgical margins were negative -Surveillance CT scan from August 04, 2023 showed stable diffuse soft tissue thickening in the presacral space, which was evaluated by MRI in July 2024, no concern for local recurrence.  CT scan also showed a 10 mm nodule in lung. -his recent CT scan (done in [place]) on

## 2023-09-12 NOTE — Telephone Encounter (Signed)
Spoke with pt via telephone to inform pt that Dr. Mosetta Putt is rescheduling pt's appt today d/t recent CT Scan results have not been received from [facility].  Stated that someone from Dr. Latanya Maudlin scheduling team will contact the pt to get him rescheduled.  Pt verbalized understanding.

## 2023-09-12 NOTE — Assessment & Plan Note (Signed)
-  secondary to oxaliplatin -he endorses taking vit B complex. -stable, no functional deficits. He declines gabapentin for now.

## 2023-09-12 NOTE — Telephone Encounter (Signed)
Called [facility] to obtain report and images for pt's recent CT Chest.  [facility] stated they will fax report and send disc of images.  Awaiting fax.

## 2023-09-12 NOTE — Assessment & Plan Note (Signed)
This was incidental finding on his surveillance CT scan on March 31, 2023, he was asymptomatic.  Workup was negative for cancer recurrence at that time. --Will recommend at least 6 months of anticoagulation, he is tolerating Eliquis well

## 2023-09-13 ENCOUNTER — Encounter: Payer: Self-pay | Admitting: Hematology

## 2023-09-13 ENCOUNTER — Inpatient Hospital Stay: Payer: BC Managed Care – PPO | Attending: Hematology | Admitting: Hematology

## 2023-09-13 ENCOUNTER — Telehealth: Payer: Self-pay

## 2023-09-13 VITALS — BP 118/80 | HR 70 | Temp 98.5°F | Resp 18 | Ht 70.0 in | Wt 172.3 lb

## 2023-09-13 DIAGNOSIS — Z86711 Personal history of pulmonary embolism: Secondary | ICD-10-CM | POA: Insufficient documentation

## 2023-09-13 DIAGNOSIS — Z9221 Personal history of antineoplastic chemotherapy: Secondary | ICD-10-CM | POA: Diagnosis not present

## 2023-09-13 DIAGNOSIS — C20 Malignant neoplasm of rectum: Secondary | ICD-10-CM

## 2023-09-13 DIAGNOSIS — Z923 Personal history of irradiation: Secondary | ICD-10-CM | POA: Diagnosis not present

## 2023-09-13 DIAGNOSIS — Z85048 Personal history of other malignant neoplasm of rectum, rectosigmoid junction, and anus: Secondary | ICD-10-CM | POA: Diagnosis present

## 2023-09-13 DIAGNOSIS — G620 Drug-induced polyneuropathy: Secondary | ICD-10-CM | POA: Diagnosis not present

## 2023-09-13 DIAGNOSIS — Z7901 Long term (current) use of anticoagulants: Secondary | ICD-10-CM | POA: Diagnosis not present

## 2023-09-13 DIAGNOSIS — T451X5A Adverse effect of antineoplastic and immunosuppressive drugs, initial encounter: Secondary | ICD-10-CM | POA: Insufficient documentation

## 2023-09-13 DIAGNOSIS — R918 Other nonspecific abnormal finding of lung field: Secondary | ICD-10-CM | POA: Insufficient documentation

## 2023-09-13 NOTE — Progress Notes (Addendum)
[facility]   [phone] [phone]   Clinic Follow up Note   Patient Care Team: Tysinger, Kermit Balo, PA-C as PCP - General (Family Medicine) Andria Meuse, MD as Consulting Physician (General Surgery) Malachy Mood, MD as Consulting Physician (Hematology) Dorothy Puffer, MD as Consulting Physician (Radiation Oncology) Pollyann Samples, NP as Nurse Practitioner (Nurse Practitioner) Marily Lente, RN as Registered Nurse Carmina Miller, MD as Consulting Physician (Radiation Oncology) Weyman Croon Radiology, MD as Rounding Team (Interventional Radiology)  Date of Service:  09/13/2023  CHIEF COMPLAINT: f/u of rectal cancer   CURRENT THERAPY:  Cancer surveillance   ASSESSMENT:  Bryan [name] is a 52 y.o. male with   Rectal cancer (HCC) -cT3b N1 M0, ypT2N0, MMR normal, -Diagnosed in October 2022 -Baseline CEA normal 3.9. -he received total neoadjuvant chemo and radiation from 09/2022-03/2023 --He underwent robotic assisted low anterior resection by Dr. Cliffton Asters on July 01, 2022.  Surgical margins were negative -Surveillance CT scan from August 04, 2023 showed stable diffuse soft tissue thickening in the presacral space, which was evaluated by MRI in July 2024, no concern for local recurrence.  CT scan also showed a 10 mm nodule in lung. -his recent CT chest scan (done in [facility]) on 09/05/2023 was negative for PE and showed multiple tiny lung nodules up to 5mm, no high concern for metastasis, will repeat CT in 3 months     History of pulmonary embolism This was incidental finding on his surveillance CT scan on March 31, 2023, he was asymptomatic.  Workup was negative for cancer recurrence at that time. 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 -stable, no functional deficits. He declines gabapentin  for now.    PLAN: - I reviewed the CT scan done at  [facility] w/ pt - Repeat CT CAP in 3 months -Continue Eliquis 1 more month - I referred to  Pelvic PT -lab and f/u in 3 months   SUMMARY OF ONCOLOGIC HISTORY: Oncology History  Rectal cancer (HCC)  08/18/2021 Initial Diagnosis   Rectal cancer (HCC)   09/14/2021 Procedure   Colonoscopy by Dr. Tiajuana Amass -The perianal examination was normal. - The digital rectal exam revealed a firm rectal mass palpated 1-2 cm from the anal verge. The mass was non-circumferential and located predominantly at the left bowel wall. - An ulcerated non-obstructing medium-sized friable mass was found in the distal rectum. The mass was non-circumferential. The mass measured three cm in length. No bleeding was present. Biopsies (5 passes total for 10 total biopsy specimens)were taken with a cold forceps for histology. Estimated blood loss was minimal. - The exam was otherwise normal throughout the examined colon. - The terminal ileum appeared normal. - No additional abnormalities were found on retroflexion   09/14/2021 Initial Biopsy   Diagnosis Rectum, biopsy, mass - INVASIVE ADENOCARCINOMA.  MMR normal  Baseline CEA 3.9     09/17/2021 Imaging   CT CAP IMPRESSION: 1. Known left rectal mass measures approximately 3.3 x 1.9 x 4.1 cm and is associated with asymmetric haziness in the left perirectal fat and small left perirectal lymph nodes. 2. No findings highly suspicious for distant metastatic disease. There are 2 small indeterminate low-density hepatic lesions. Consider MRI for further characterization. 3. No suspicious findings in the chest. Small perifissural nodules bilaterally, likely benign.   09/25/2021 Imaging  Outside liver and pelvic MRI   10/13/2021 Cancer Staging   Staging form: Colon and Rectum, AJCC 8th Edition - Clinical stage from 10/13/2021: Stage IIIB (cT3, cN1, cM0) - Signed by Malachy Mood, MD on 10/23/2021 Stage  prefix: Initial diagnosis   10/23/2021 - 02/05/2022 Chemotherapy   Patient is on Treatment Plan : RECTAL Xelox (Capeox) q21d x 6 cycles     02/18/2022 Imaging   CT CHEST ABDOMEN PELVIS W IV CONTRAST   Impression  IMPRESSION:  1.  Soft tissue mass along the left lateral aspect of the rectum and adjacent infiltration of the perirectal fat on the left, measures approximately 2.5 x 3.9 cm.  2.  There is no adenopathy or evidence of distant metastatic disease.   3.  Hepatic steatosis.       INTERVAL HISTORY:  Bryan [name] is here for a follow up of rectal cancer. He was last seen by me on 08/08/2023. He presents to the clinic accompanied by wife. Pt state that he is feeling fine. Pt denies having cough, he have some aching in the left part of the chest, he states its intermittent. Pt states that he has some residual side effects from previous treatment some numbness and tingling in finger tips an toes. He reports its almost subsided.   All other systems were reviewed with the patient and are negative.  MEDICAL HISTORY:  Past Medical History:  Diagnosis Date   Allergic rhinitis, cause unspecified    spring only   Hypercholesteremia    Rectal cancer (HCC) 09/14/2021    SURGICAL HISTORY: Past Surgical History:  Procedure Laterality Date   DIVERTING ILEOSTOMY N/A 07/01/2022   Procedure: DIVERTING LOOP ILEOSTOMY;  Surgeon: Andria Meuse, MD;  Location: [facility];  Service: General;  Laterality: N/A;   FLEXIBLE SIGMOIDOSCOPY N/A 07/01/2022   Procedure: FLEXIBLE SIGMOIDOSCOPY;  Surgeon: Andria Meuse, MD;  Location: [facility];  Service: General;  Laterality: N/A;   FLEXIBLE SIGMOIDOSCOPY N/A 02/04/2023   Procedure: DIAGNOSTIC FLEXIBLE SIGMOIDOSCOPY;  Surgeon: Andria Meuse, MD;  Location: [facility];  Service: General;  Laterality: N/A;   FLEXIBLE SIGMOIDOSCOPY N/A 06/02/2023   Procedure: FLEXIBLE SIGMOIDOSCOPY- DIAGNOSTIC;  Surgeon: Andria Meuse, MD;  Location: [facility];  Service: General;  Laterality: N/A;   IR CATHETER TUBE CHANGE  10/08/2022   IR CATHETER TUBE CHANGE  12/03/2022   IR FLUORO RM 30-60 MIN  02/11/2023   IR RADIOLOGIST EVAL & MGMT  08/03/2022   IR RADIOLOGIST EVAL & MGMT  08/17/2022   IR RADIOLOGIST EVAL & MGMT  09/02/2022   IR RADIOLOGIST EVAL & MGMT  09/30/2022   None     XI ROBOTIC ASSISTED LOWER ANTERIOR RESECTION N/A 07/01/2022   Procedure: XI ROBOTIC ASSISTED LOWER ANTERIOR RESECTION STAPLED COLOANAL ANASTAMOSIS INTRAOP ASSESSMENT OF PERFUSION AND TAPP BLOCKS;  Surgeon: Andria Meuse, MD;  Location: [facility];  Service: General;  Laterality: N/A;    I have reviewed the social history and family history with the patient and they are unchanged from previous note.  ALLERGIES:  has No Known Allergies.  MEDICATIONS:  Current Outpatient Medications  Medication Sig Dispense Refill   apixaban (ELIQUIS) 5 MG TABS tablet Take 1 tablet (5 mg total) by mouth 2 (two) times daily. 60 tablet 4   APIXABAN (ELIQUIS) VTE STARTER PACK (10MG  AND 5MG ) Take as directed on package: start with two-5mg  tablets twice daily for 7 days. On day 8, switch to one-5mg  tablet twice daily. (Patient not taking: Reported  on 05/31/2023) 1 each 0   Cyanocobalamin (B-12) 5000 MCG CAPS Take 5,000 mcg by mouth daily.     No current facility-administered medications for this visit.    PHYSICAL EXAMINATION: ECOG PERFORMANCE STATUS: 0 - Asymptomatic  Vitals:   09/13/23 1217  BP: 118/80  Pulse: 70  Resp: 18  Temp: 98.5 F (36.9 C)  SpO2: 100%   Wt Readings from Last 3 Encounters:  09/13/23 172 lb 4.8 oz (78.2 kg)  08/08/23 166 lb 8 oz (75.5 kg)  06/02/23 164 lb (74.4 kg)     GENERAL:alert, no distress and comfortable SKIN: skin color, texture, turgor are normal, no rashes or significant lesions EYES: normal, Conjunctiva are pink and non-injected, sclera clear NECK: supple, thyroid normal size, non-tender, without nodularity LYMPH:  no palpable  lymphadenopathy in the cervical, axillary  LUNGS: clear to auscultation and percussion with normal breathing effort HEART: regular rate & rhythm and no murmurs and no lower extremity edema ABDOMEN:abdomen soft, non-tender and normal bowel sounds Musculoskeletal:no cyanosis of digits and no clubbing  NEURO: alert & oriented x 3 with fluent speech, no focal motor/sensory deficits  LABORATORY DATA:  I have reviewed the data as listed    Latest Ref Rng & Units 08/01/2023   12:32 PM 04/11/2023    1:09 PM 03/31/2023   11:00 PM  CBC  WBC 4.0 - 10.5 K/uL 4.0  3.8  3.8   Hemoglobin 13.0 - 17.0 g/dL 96.2  95.2  84.1   Hematocrit 39.0 - 52.0 % 44.7  42.2  43.2   Platelets 150 - 400 K/uL 235  248  250         Latest Ref Rng & Units 08/01/2023   12:32 PM 04/11/2023    1:09 PM 03/31/2023   11:00 PM  CMP  Glucose 70 - 99 mg/dL 324  93  97   BUN 6 - 20 mg/dL 15  15  17    Creatinine 0.61 - 1.24 mg/dL 4.01  0.27  2.53   Sodium 135 - 145 mmol/L 138  139  137   Potassium 3.5 - 5.1 mmol/L 4.3  3.9  3.9   Chloride 98 - 111 mmol/L 104  108  104   CO2 22 - 32 mmol/L 27  25  25    Calcium 8.9 - 10.3 mg/dL 9.2  9.4  8.8   Total Protein 6.5 - 8.1 g/dL 7.1  6.9    Total Bilirubin 0.3 - 1.2 mg/dL 0.6  0.4    Alkaline Phos 38 - 126 U/L 96  112    AST 15 - 41 U/L 37  16    ALT 0 - 44 U/L 37  30        RADIOGRAPHIC STUDIES: I have personally reviewed the radiological images as listed and agreed with the findings in the report. No results found.    Orders Placed This Encounter  Procedures   CT CHEST ABDOMEN PELVIS W CONTRAST    Please send images via CD  with pt at time of scan    Standing Status:   Future    Standing Expiration Date:   09/12/2024    Order Specific Question:   If indicated for the ordered procedure, I authorize the administration of contrast media per Radiology protocol    Answer:   Yes    Order Specific Question:   Does the patient have a contrast media/X-ray dye allergy?    Answer:    No    Order Specific  Question:   Preferred imaging location?    Answer:   External    Order Specific Question:   Release to patient    Answer:   Immediate    Order Specific Question:   If indicated for the ordered procedure, I authorize the administration of oral contrast media per Radiology protocol    Answer:   Yes    Order Specific Question:   Call Results- Best Contact Number?    Answer:   [phone]   Ambulatory referral to Physical Therapy    Referral Priority:   Routine    Referral Type:   Physical Medicine    Referral Reason:   Specialty Services Required    Requested Specialty:   Physical Therapy    Number of Visits Requested:   1   All questions were answered. The patient knows to call the clinic with any problems, questions or concerns. No barriers to learning was detected. The total time spent in the appointment was 25 minutes.     Malachy Mood, MD 09/13/2023   Carolin Coy, CMA, am acting as scribe for Malachy Mood, MD.   I have reviewed the above documentation for accuracy and completeness, and I agree with the above.

## 2023-09-13 NOTE — Telephone Encounter (Signed)
Faxed orders for CT CAP w/contrast to [facility] [phone]).  Fax confirmation received.

## 2023-09-21 ENCOUNTER — Ambulatory Visit (AMBULATORY_SURGERY_CENTER): Payer: BC Managed Care – PPO | Admitting: Gastroenterology

## 2023-09-21 ENCOUNTER — Encounter: Payer: Self-pay | Admitting: Gastroenterology

## 2023-09-21 VITALS — BP 102/58 | HR 74 | Temp 98.4°F | Resp 14 | Ht 70.0 in | Wt 172.0 lb

## 2023-09-21 DIAGNOSIS — C20 Malignant neoplasm of rectum: Secondary | ICD-10-CM

## 2023-09-21 DIAGNOSIS — Z85040 Personal history of malignant carcinoid tumor of rectum: Secondary | ICD-10-CM

## 2023-09-21 DIAGNOSIS — Z08 Encounter for follow-up examination after completed treatment for malignant neoplasm: Secondary | ICD-10-CM

## 2023-09-21 MED ORDER — SODIUM CHLORIDE 0.9 % IV SOLN: 500.0000 mL | Freq: Once | INTRAVENOUS | Status: DC

## 2023-09-21 NOTE — Progress Notes (Unsigned)
Pt's states no medical or surgical changes since previsit or office visit. VS assessed by K.P ?

## 2023-09-21 NOTE — Op Note (Addendum)
[facility] Patient Name: Bryan [name] Procedure Date: 09/21/2023 4:03 PM MRN: [id number] Endoscopist: Lorin Picket E. Tomasa Rand , MD, [id number] Age: 52 Referring MD:  Date of Birth: 1971/03/18 Gender: Male Account #: [id number] Procedure:                Colonoscopy Indications:              High risk colon cancer surveillance: Personal                            history of rectal cancer; s/p low anterior                            resection and loop ileostomy Medicines:                Monitored Anesthesia Care Procedure:                Pre-Anesthesia Assessment:                           - Prior to the procedure, a History and Physical                            was performed, and patient medications and                            allergies were reviewed. The patient's tolerance of                            previous anesthesia was also reviewed. The risks                            and benefits of the procedure and the sedation                            options and risks were discussed with the patient.                            All questions were answered, and informed consent                            was obtained. Prior Anticoagulants: The patient has                            taken Eliquis (apixaban), last dose was 2 days                            prior to procedure. ASA Grade Assessment: II - A                            patient with mild systemic disease. After reviewing                            the risks and benefits, the patient was deemed in  satisfactory condition to undergo the procedure.                           After obtaining informed consent, the colonoscope                            was passed under direct vision. Throughout the                            procedure, the patient's blood pressure, pulse, and                            oxygen saturations were monitored continuously. The                            [id number]-[id number] Slim SN  [id number] was introduced through                            the anus and advanced to the the cecum, identified                            by appendiceal orifice and ileocecal valve. The                            colonoscopy was somewhat difficult due to                            restricted mobility of the colon. The patient                            tolerated the procedure well. The quality of the                            bowel preparation was good except the cecum and                            ascending colon were poor due to copious dense                            mucus which could not be aspirated/rinsed. The                            appendiceal orifice and ileocecal valve were                            photographed. Scope In: 4:17:28 PM Scope Out: 4:33:02 PM Scope Withdrawal Time: 0 hours 8 minutes 26 seconds  Total Procedure Duration: 0 hours 15 minutes 34 seconds  Findings:                 The perianal examination was normal.                           A post-surgical anastomosis was found on digital  exam. This was found to be stenosed.                           A diffuse area of moderately friable (with                            spontaneous bleeding) mucosa was found in the                            entire colon, more prominent in the right colon.                           The exam was otherwise normal throughout the                            examined colon. Complications:            No immediate complications. Estimated Blood Loss:     Estimated blood loss: none. Impression:               - Stenosed post-surgical anastomosis found on                            digital exam.                           - Friable (with spontaneous bleeding) mucosa in the                            entire examined colon, consistent with diversion                            colitis                           - No specimens collected.                           -  Visualization of the cecum/ascending colon                            limited due to dense mucus. Bowel prep would be                            unlikely to provide adequate cleansing given                            presence of loop ileostomy. Recommendation:           - Patient has a contact number available for                            emergencies. The signs and symptoms of potential                            delayed complications were discussed with the  patient. Return to normal activities tomorrow.                            Written discharge instructions were provided to the                            patient.                           - Resume previous diet.                           - Continue present medications.                           - Repeat colonoscopy in 1-2 years following ostomy                            take down because the visualization of the right                            colon was suboptimal                           - Resume Eliquis (apixaban) at prior dose tomorrow. Shelina Luo E. Tomasa Rand, MD 09/21/2023 4:50:58 PM This report has been signed electronically.

## 2023-09-21 NOTE — Patient Instructions (Signed)
    Resume Eliquis at prior dose tomorrow (09-22-23)  Resume previous diet & medications      YOU HAD AN ENDOSCOPIC PROCEDURE TODAY AT THE [facility]:   Refer to the procedure report that was given to you for any specific questions about what was found during the examination.  If the procedure report does not answer your questions, please call your gastroenterologist to clarify.  If you requested that your care partner not be given the details of your procedure findings, then the procedure report has been included in a sealed envelope for you to review at your convenience later.  YOU SHOULD EXPECT: Some feelings of bloating in the abdomen. Passage of more gas than usual.  Walking can help get rid of the air that was put into your GI tract during the procedure and reduce the bloating. If you had a lower endoscopy (such as a colonoscopy or flexible sigmoidoscopy) you may notice spotting of blood in your stool or on the toilet paper. If you underwent a bowel prep for your procedure, you may not have a normal bowel movement for a few days.  Please Note:  You might notice some irritation and congestion in your nose or some drainage.  This is from the oxygen used during your procedure.  There is no need for concern and it should clear up in a day or so.  SYMPTOMS TO REPORT IMMEDIATELY:  Following lower endoscopy (colonoscopy or flexible sigmoidoscopy):  Excessive amounts of blood in the stool  Significant tenderness or worsening of abdominal pains  Swelling of the abdomen that is new, acute  Fever of 100F or higher   For urgent or emergent issues, a gastroenterologist can be reached at any hour by calling [phone]. Do not use MyChart messaging for urgent concerns.    DIET:  We do recommend a small meal at first, but then you may proceed to your regular diet.  Drink plenty of fluids but you should avoid alcoholic beverages for 24 hours.  ACTIVITY:  You should plan to take  it easy for the rest of today and you should NOT DRIVE or use heavy machinery until tomorrow (because of the sedation medicines used during the test).    FOLLOW UP: Our staff will call the number listed on your records the next business day following your procedure.  We will call around 7:15- 8:00 am to check on you and address any questions or concerns that you may have regarding the information given to you following your procedure. If we do not reach you, we will leave a message.     If any biopsies were taken you will be contacted by phone or by letter within the next 1-3 weeks.  Please call us at [phone] if you have not heard about the biopsies in 3 weeks.    SIGNATURES/CONFIDENTIALITY: You and/or your care partner have signed paperwork which will be entered into your electronic medical record.  These signatures attest to the fact that that the information above on your After Visit Summary has been reviewed and is understood.  Full responsibility of the confidentiality of this discharge information lies with you and/or your care-partner.

## 2023-09-21 NOTE — Progress Notes (Unsigned)
[facility] History and Physical   Primary Care Physician:  Jac Canavan, PA-C   Reason for Procedure:   History of rectal cancer  Plan:    Colonoscopy     HPI: Bryan [name] is a 52 y.o. male undergoing surveillance colonoscopy. He was diagnosed with rectal cancer in Sept 2022 and underwent neoadjuvant chemotherapy and radiation followed by ultralow anterior resection by Dr. Cliffton Asters in July 2023 with diverting ileostomy.  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  He does not pass any stool per rectum.    Past Medical History:  Diagnosis Date   Allergic rhinitis, cause unspecified    spring only   Clotting disorder (HCC)    Hypercholesteremia    Pulmonary embolus (HCC) 03/28/2023   CT scan (07/28/2023) now negative for blood clots   Rectal cancer (HCC) 09/14/2021    Past Surgical History:  Procedure Laterality Date   DIVERTING ILEOSTOMY N/A 07/01/2022   Procedure: DIVERTING LOOP ILEOSTOMY;  Surgeon: Andria Meuse, MD;  Location: [facility];  Service: General;  Laterality: N/A;   FLEXIBLE SIGMOIDOSCOPY N/A 07/01/2022   Procedure: FLEXIBLE SIGMOIDOSCOPY;  Surgeon: Andria Meuse, MD;  Location: [facility];  Service: General;  Laterality: N/A;   FLEXIBLE SIGMOIDOSCOPY N/A 02/04/2023   Procedure: DIAGNOSTIC FLEXIBLE SIGMOIDOSCOPY;  Surgeon: Andria Meuse, MD;  Location: [facility];  Service: General;  Laterality: N/A;   FLEXIBLE SIGMOIDOSCOPY N/A 06/02/2023   Procedure: FLEXIBLE SIGMOIDOSCOPY- DIAGNOSTIC;  Surgeon: Andria Meuse, MD;  Location: [facility];  Service: General;  Laterality: N/A;   IR CATHETER TUBE CHANGE  10/08/2022   IR CATHETER TUBE CHANGE  12/03/2022   IR FLUORO RM 30-60 MIN  02/11/2023   IR RADIOLOGIST EVAL & MGMT  08/03/2022   IR RADIOLOGIST EVAL & MGMT  08/17/2022   IR RADIOLOGIST EVAL & MGMT  09/02/2022   IR RADIOLOGIST EVAL & MGMT  09/30/2022   None     XI ROBOTIC ASSISTED LOWER ANTERIOR RESECTION N/A 07/01/2022    Procedure: XI ROBOTIC ASSISTED LOWER ANTERIOR RESECTION STAPLED COLOANAL ANASTAMOSIS INTRAOP ASSESSMENT OF PERFUSION AND TAPP BLOCKS;  Surgeon: Andria Meuse, MD;  Location: [facility];  Service: General;  Laterality: N/A;    Prior to Admission medications   Medication Sig Start Date End Date Taking? Authorizing Provider  apixaban (ELIQUIS) 5 MG TABS tablet Take 1 tablet (5 mg total) by mouth 2 (two) times daily. 04/04/23   Malachy Mood, MD  APIXABAN Everlene Balls) VTE STARTER PACK (10MG  AND 5MG ) Take as directed on package: start with two-5mg  tablets twice daily for 7 days. On day 8, switch to one-5mg  tablet twice daily. Patient not taking: Reported on 05/31/2023 04/01/23   Loleta Rose, MD  Cyanocobalamin (B-12) 5000 MCG CAPS Take 5,000 mcg by mouth daily. Patient not taking: Reported on 09/21/2023    [provider]    Current Outpatient Medications  Medication Sig Dispense Refill   apixaban (ELIQUIS) 5 MG TABS tablet Take 1 tablet (5 mg total) by mouth 2 (two) times daily. 60 tablet 4   APIXABAN (ELIQUIS) VTE STARTER PACK (10MG  AND 5MG ) Take as directed on package: start with two-5mg  tablets twice daily for 7 days. On day 8, switch to one-5mg  tablet twice daily. (Patient not taking: Reported on 05/31/2023) 1 each 0   Cyanocobalamin (B-12) 5000 MCG CAPS Take 5,000 mcg by mouth daily. (Patient not taking: Reported on 09/21/2023)     Current Facility-Administered Medications  Medication Dose Route Frequency Provider  Last Rate Last Admin   0.9 %  sodium chloride infusion  500 mL Intravenous Once Jenel Lucks, MD        Allergies as of 09/21/2023   (No Known Allergies)    Family History  Problem Relation Age of Onset   Cancer Mother 31       Breast   Alcohol abuse Father    Cancer Paternal Aunt        breast   Breast cancer Paternal Aunt    Diabetes Paternal Aunt    Heart disease Paternal Aunt    Breast cancer Cousin    Cancer Paternal Great-grandfather        prostate    Prostate cancer Paternal Great-grandfather    Hypertension Neg Hx    Colon polyps Neg Hx    Esophageal cancer Neg Hx    Rectal cancer Neg Hx    Stomach cancer Neg Hx     Social History   Socioeconomic History   Marital status: Married    Spouse name: Not on file   Number of children: 3   Years of education: Not on file   Highest education level: Not on file  Occupational History   Occupation: Driver/yard work    Associate Professor: [employer]    Comment: nights  Tobacco Use   Smoking status: Never   Smokeless tobacco: Never  Vaping Use   Vaping status: Never Used  Substance and Sexual Activity   Alcohol use: No   Drug use: No   Sexual activity: Not on file  Other Topics Concern   Not on file  Social History Narrative   3 children--ages 14, 47, 21 (as of 10/14/21)   Social Determinants of Health   Financial Resource Strain: Not on file  Food Insecurity: Not on file  Transportation Needs: Not on file  Physical Activity: Not on file  Stress: Not on file  Social Connections: Unknown (05/11/2022)   Received [facility]   Social Network    Social Network: Not on file  Intimate Partner Violence: Unknown (04/02/2022)   Received from [facility], [facility]   HITS    Physically Hurt: Not on file    Insult or Talk Down To: Not on file    Threaten Physical Harm: Not on file    Scream or Curse: Not on file    Review of Systems:  All other review of systems negative except as mentioned in the HPI.  Physical Exam: Vital signs BP 114/77   Pulse 81   Temp 98.4 F (36.9 C) (Skin)   Ht 5\' 10"  (1.778 m)   Wt 172 lb (78 kg)   SpO2 97%   BMI 24.68 kg/m   General:   Alert,  Well-developed, well-nourished, pleasant and cooperative in NAD Airway:  Mallampati 2 Lungs:  Clear throughout to auscultation.   Heart:  Regular rate and rhythm; no murmurs, clicks, rubs,  or gallops. Abdomen:  Soft, nontender and nondistended. Normal bowel sounds.   Neuro/Psych:   Normal mood and affect. A and O x 3   Brooklynne Pereida E. Tomasa Rand, MD [facility]

## 2023-09-21 NOTE — Progress Notes (Unsigned)
Vss nad trans to pacu 

## 2023-09-22 ENCOUNTER — Encounter: Payer: Self-pay | Admitting: Hematology

## 2023-09-22 ENCOUNTER — Other Ambulatory Visit: Payer: Self-pay

## 2023-09-22 ENCOUNTER — Telehealth: Payer: Self-pay | Admitting: *Deleted

## 2023-09-22 ENCOUNTER — Telehealth: Payer: Self-pay

## 2023-09-22 NOTE — Telephone Encounter (Signed)
No answer for post procedure call back. Left VM.

## 2023-09-22 NOTE — Telephone Encounter (Signed)
Spoke with pt via telephone regarding Lab appt on 12/05/2023.  Pt stated he has a MRI Scheduled at [facility] on 12/05/2023 at 12:30pm; therefore, need to reschedule lab appt to a earlier time that day.  Pt stated he could come in for labs at 11:30am in order to get to his appt w/[facility] in time.  Appt time changed.  Pt confirmed new lab appt time on 12/05/2023.  Pt had no further questions or concerns.

## 2023-09-23 ENCOUNTER — Telehealth: Payer: Self-pay | Admitting: Hematology

## 2023-09-25 ENCOUNTER — Other Ambulatory Visit: Payer: Self-pay | Admitting: Hematology

## 2023-09-26 ENCOUNTER — Other Ambulatory Visit: Payer: Self-pay

## 2023-09-26 MED ORDER — APIXABAN 5 MG PO TABS: 5.0000 mg | ORAL_TABLET | Freq: Two times a day (BID) | ORAL | 0 refills | Status: DC

## 2023-10-10 ENCOUNTER — Ambulatory Visit: Payer: BC Managed Care – PPO | Attending: Hematology

## 2023-10-10 ENCOUNTER — Other Ambulatory Visit: Payer: Self-pay

## 2023-10-10 DIAGNOSIS — R279 Unspecified lack of coordination: Secondary | ICD-10-CM | POA: Diagnosis present

## 2023-10-10 DIAGNOSIS — R293 Abnormal posture: Secondary | ICD-10-CM | POA: Insufficient documentation

## 2023-10-10 DIAGNOSIS — C20 Malignant neoplasm of rectum: Secondary | ICD-10-CM | POA: Insufficient documentation

## 2023-10-10 DIAGNOSIS — M6281 Muscle weakness (generalized): Secondary | ICD-10-CM | POA: Insufficient documentation

## 2023-10-10 NOTE — Therapy (Signed)
OUTPATIENT PHYSICAL THERAPY MALE PELVIC EVALUATION   Patient Name: Bryan [name] MRN: [id number] DOB:1971-10-04, 52 y.o., male Today's Date: 10/10/2023  END OF SESSION:  PT End of Session - 10/10/23 1455     Visit Number 1    Date for PT Re-Evaluation 03/26/24    Authorization Type [employer]    PT Start Time 1445    PT Stop Time 1525    PT Time Calculation (min) 40 min    Activity Tolerance Patient tolerated treatment well    Behavior During Therapy [facility] for tasks assessed/performed             Past Medical History:  Diagnosis Date   Allergic rhinitis, cause unspecified    spring only   Clotting disorder (HCC)    Hypercholesteremia    Pulmonary embolus (HCC) 03/28/2023   CT scan (07/28/2023) now negative for blood clots   Rectal cancer (HCC) 09/14/2021   Past Surgical History:  Procedure Laterality Date   DIVERTING ILEOSTOMY N/A 07/01/2022   Procedure: DIVERTING LOOP ILEOSTOMY;  Surgeon: Andria Meuse, MD;  Location: [facility];  Service: General;  Laterality: N/A;   FLEXIBLE SIGMOIDOSCOPY N/A 07/01/2022   Procedure: FLEXIBLE SIGMOIDOSCOPY;  Surgeon: Andria Meuse, MD;  Location: [facility];  Service: General;  Laterality: N/A;   FLEXIBLE SIGMOIDOSCOPY N/A 02/04/2023   Procedure: DIAGNOSTIC FLEXIBLE SIGMOIDOSCOPY;  Surgeon: Andria Meuse, MD;  Location: [facility];  Service: General;  Laterality: N/A;   FLEXIBLE SIGMOIDOSCOPY N/A 06/02/2023   Procedure: FLEXIBLE SIGMOIDOSCOPY- DIAGNOSTIC;  Surgeon: Andria Meuse, MD;  Location: [facility];  Service: General;  Laterality: N/A;   IR CATHETER TUBE CHANGE  10/08/2022   IR CATHETER TUBE CHANGE  12/03/2022   IR FLUORO RM 30-60 MIN  02/11/2023   IR RADIOLOGIST EVAL & MGMT  08/03/2022   IR RADIOLOGIST EVAL & MGMT  08/17/2022   IR RADIOLOGIST EVAL & MGMT  09/02/2022   IR RADIOLOGIST EVAL & MGMT  09/30/2022   None     XI ROBOTIC ASSISTED LOWER ANTERIOR RESECTION N/A 07/01/2022   Procedure: XI ROBOTIC ASSISTED LOWER ANTERIOR  RESECTION STAPLED COLOANAL ANASTAMOSIS INTRAOP ASSESSMENT OF PERFUSION AND TAPP BLOCKS;  Surgeon: Andria Meuse, MD;  Location: [facility];  Service: General;  Laterality: N/A;   Patient Active Problem List   Diagnosis Date Noted   History of pulmonary embolism 08/07/2023   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)  THERAPY DIAG:  Abnormal posture  Muscle weakness (generalized)  Unspecified lack of coordination  Rationale for Evaluation and Treatment: Rehabilitation  ONSET DATE: October 2022  SUBJECTIVE:  SUBJECTIVE STATEMENT: Pt diagnosed with rectal cancer in October 2022. Chemoradiation from 09/2022-03/2023. He underwent surgery to remove tumor in 07/01/2022 and had ileostomy. He is hoping to have reversal of ileostomy and needs to strengthen pelvic floor sphincter muscles. He does have some leakage of mucous at this time. He is working on exercising on a regular basis (push-ups, starting return to lifting weight, squats). A lot of walking in work.  Fluid intake: a lot of water   PAIN:  Are you having pain? No   PRECAUTIONS: Other: rectal cancer   RED FLAGS: None   WEIGHT BEARING RESTRICTIONS: No  FALLS:  Has patient fallen in last 6 months? No  LIVING ENVIRONMENT: Lives with:  lives with their family Lives in: House/apartment   OCCUPATION: [employer]  PLOF: Independent  PATIENT GOALS: strengthen pelvic floor to prepare for ileostomy reversal   PERTINENT HISTORY:  Rectal cancer, chemo/radiation, surgery for cancer resection (sigmoidectomy)/ileostomy, chemotherapy-induced peripheral neuropathy, abdominopelvic abscess, history of pulmonary embolism   BOWEL MOVEMENT: Pain with bowel movement: NA Type of bowel movement:NA Fully empty rectum: NA Leakage: Yes: daily leaking of mucous - he can feel the leakage Pads: Yes: when he is at work he wears protection Fiber supplement: No  URINATION: Pain with urination: No Fully empty bladder: Yes: - Stream: Strong Urgency: No Frequency: every 2-3 hours  Leakage:  no leakage Pads: Yes: see above  INTERCOURSE: Pain with intercourse:  no pain Climax: erection is not full, but has been improving since surgery  Ejaculation: Yes: no ejuculate, but he does have orgasm   OBJECTIVE:  Note: Objective measures were completed at Evaluation unless otherwise noted. 10/10/23: DIAGNOSTIC FINDINGS:  CT 08/04/2023 and MRI in 06/2023 that showed no concern for recurrence CT scan showed multiple   COGNITION: Overall cognitive status: Within functional limits for tasks assessed     SENSATION: Light touch: Appears intact Proprioception: Appears intact  FUNCTIONAL TESTS:  Squat: WNL Single leg stance:   Rt 2 second balance, opposite pelvic drop  Lt 2 second balance, opposite pelvic drop Chin-up test: no diastasis or distortion  GAIT: Comments: WNL  POSTURE: rounded shoulders, forward head, increased thoracic kyphosis, and posterior pelvic tilt  PELVIC ALIGNMENT: WNL  LUMBARAROM/PROM:  A/PROM A/PROM  Eval % available  Flexion 100  Extension 100  Right lateral flexion 100  Left lateral flexion 100  Right rotation 100  Left rotation 100   (Blank rows = not tested)   PALPATION: GENERAL ileotomy bag; no abdominal  tenderness               External Perineal Exam WNL              Internal Pelvic Floor WNL Patient confirms identification and approves PT to assess internal pelvic floor and treatment Yes  PELVIC MMT:   MMT eval  Internal Anal Sphincter 3/5  External Anal Sphincter 2/5  Puborectalis 3/5  Diastasis Recti   (Blank rows = not tested)  TONE: Low in external anal sphincter  TODAY'S TREATMENT:  DATE:  10/10/23  EVAL  Neuromuscular re-education: Pt provides verbal consent for internal vaginal/rectal pelvic floor exam. Pelvic floor contraction training with rectal feedback Quick flicks Long holds    PATIENT EDUCATION:  Education details: See above Person educated: Patient Education method: Explanation, Demonstration, Tactile cues, Verbal cues, and Handouts Education comprehension: verbalized understanding  HOME EXERCISE PROGRAM: [id number]  ASSESSMENT:  CLINICAL IMPRESSION: Patient is a 52 y.o. male who was seen today for physical therapy evaluation and treatment for pelvic floor strengthening before ileostomy reversal. Exam findings notable for decreased balance, poor functional core/pelvic stability in standing, and decreased pelvic floor and external anal sphincter strength and endurance. Signs and symptoms are most consistent with external anal sphincter weakness from disuse with mild levator ani weakness. He tolerated initial treatment of pelvic floor/external sphincter contraction training very well with improved strength and ability to contract with exhale. He will continue to benefit from skilled PT intervention in order to improve pelvic floor/external anal sphincter strength and begin/progress functional strengthening program.   OBJECTIVE IMPAIRMENTS: decreased activity tolerance, decreased coordination, decreased endurance, decreased strength,  increased fascial restrictions, increased muscle spasms, impaired tone, postural dysfunction, and pain.   ACTIVITY LIMITATIONS: continence  PARTICIPATION LIMITATIONS: interpersonal relationship, community activity, and occupation  PERSONAL FACTORS: 1 comorbidity: medical history   are also affecting patient's functional outcome.   REHAB POTENTIAL: Good  CLINICAL DECISION MAKING: Stable/uncomplicated  EVALUATION COMPLEXITY: Low   GOALS: Goals reviewed with patient? Yes  SHORT TERM GOALS: Target date: 11/07/23  Pt will be independent with HEP.   Baseline: Goal status: INITIAL  2.  Pt will be able to perform full pelvic floor muscle and external anal sphincter contraction with appropriate breath coordination. Baseline:  Goal status: INITIAL    LONG TERM GOALS: Target date: 03/26/2024  Pt will be independent with advanced HEP.   Baseline:  Goal status: INITIAL  2.  Pt will demonstrate 4/5 pelvic floor and external anal sphincter contraction strength in order to remain continent with ileostomy reversal.  Baseline:  Goal status: INITIAL  3.  Pt will be able to hold pelvic floor muscle and external anal sphincter contraction for >30 seconds in order to remain continent with ileostomy reversal.  Baseline:  Goal status: INITIAL  4.  Pt will be able to balance on single leg bil for >15 seconds without opposite pelvic drop in order to demonstrate appropriate pelvic stability and functional core strength.  Baseline:  Goal status: INITIAL    PLAN:  PT FREQUENCY: 1-2x/week  PT DURATION: 6 months  PLANNED INTERVENTIONS: 97110-Therapeutic exercises, 97530- Therapeutic activity, [id number]- Neuromuscular re-education, 97535- Self Care, and [id number]- Manual therapy  PLAN FOR NEXT SESSION: Core training; progress to functional strengthening activities with deep core/breath coordination.    Julio Alm, PT, DPT10/14/243:33 PM

## 2023-10-31 ENCOUNTER — Ambulatory Visit: Payer: BC Managed Care – PPO | Attending: Hematology

## 2023-10-31 DIAGNOSIS — R279 Unspecified lack of coordination: Secondary | ICD-10-CM | POA: Diagnosis present

## 2023-10-31 DIAGNOSIS — M6281 Muscle weakness (generalized): Secondary | ICD-10-CM | POA: Insufficient documentation

## 2023-10-31 DIAGNOSIS — R293 Abnormal posture: Secondary | ICD-10-CM | POA: Diagnosis present

## 2023-10-31 NOTE — Therapy (Signed)
OUTPATIENT PHYSICAL THERAPY MALE PELVIC TREATMENT   Patient Name: Bryan [name] MRN: [id number] DOB:20-Sep-1971, 52 y.o., male Today's Date: 10/31/2023  END OF SESSION:    Past Medical History:  Diagnosis Date   Allergic rhinitis, cause unspecified    spring only   Clotting disorder (HCC)    Hypercholesteremia    Pulmonary embolus (HCC) 03/28/2023   CT scan (07/28/2023) now negative for blood clots   Rectal cancer (HCC) 09/14/2021   Past Surgical History:  Procedure Laterality Date   DIVERTING ILEOSTOMY N/A 07/01/2022   Procedure: DIVERTING LOOP ILEOSTOMY;  Surgeon: Andria Meuse, MD;  Location: [facility];  Service: General;  Laterality: N/A;   FLEXIBLE SIGMOIDOSCOPY N/A 07/01/2022   Procedure: FLEXIBLE SIGMOIDOSCOPY;  Surgeon: Andria Meuse, MD;  Location: [facility];  Service: General;  Laterality: N/A;   FLEXIBLE SIGMOIDOSCOPY N/A 02/04/2023   Procedure: DIAGNOSTIC FLEXIBLE SIGMOIDOSCOPY;  Surgeon: Andria Meuse, MD;  Location: [facility];  Service: General;  Laterality: N/A;   FLEXIBLE SIGMOIDOSCOPY N/A 06/02/2023   Procedure: FLEXIBLE SIGMOIDOSCOPY- DIAGNOSTIC;  Surgeon: Andria Meuse, MD;  Location: [facility];  Service: General;  Laterality: N/A;   IR CATHETER TUBE CHANGE  10/08/2022   IR CATHETER TUBE CHANGE  12/03/2022   IR FLUORO RM 30-60 MIN  02/11/2023   IR RADIOLOGIST EVAL & MGMT  08/03/2022   IR RADIOLOGIST EVAL & MGMT  08/17/2022   IR RADIOLOGIST EVAL & MGMT  09/02/2022   IR RADIOLOGIST EVAL & MGMT  09/30/2022   None     XI ROBOTIC ASSISTED LOWER ANTERIOR RESECTION N/A 07/01/2022   Procedure: XI ROBOTIC ASSISTED LOWER ANTERIOR RESECTION STAPLED COLOANAL ANASTAMOSIS INTRAOP ASSESSMENT OF PERFUSION AND TAPP BLOCKS;  Surgeon: Andria Meuse, MD;  Location: [facility];  Service: General;  Laterality: N/A;   Patient Active Problem List   Diagnosis Date Noted   History of pulmonary embolism 08/07/2023   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  Rationale for Evaluation and Treatment: Rehabilitation  ONSET DATE: October 2022  SUBJECTIVE:  SUBJECTIVE STATEMENT: Pt states that contractions are going well. He feels like he is not able to hold strong for 20 seconds yet, but he is noticing improvement in leakage.   PAIN:  Are you having pain? No   PRECAUTIONS: Other: rectal cancer   RED FLAGS: None   WEIGHT BEARING RESTRICTIONS: No  FALLS:  Has patient fallen in last 6 months? No  LIVING ENVIRONMENT: Lives with: lives with their family Lives in: House/apartment   OCCUPATION: [employer]  PLOF: Independent  PATIENT GOALS: strengthen pelvic floor to prepare for ileostomy reversal   PERTINENT HISTORY:  Rectal cancer, chemo/radiation, surgery for cancer resection (sigmoidectomy)/ileostomy, chemotherapy-induced peripheral neuropathy, abdominopelvic abscess, history of pulmonary embolism   BOWEL MOVEMENT: Pain with bowel movement: NA Type of bowel movement:NA Fully empty rectum: NA Leakage: Yes: daily leaking of mucous - he can feel the leakage Pads: Yes: when he is at work he wears protection Fiber supplement: No  URINATION: Pain with urination: No Fully empty bladder: Yes: - Stream:  Strong Urgency: No Frequency: every 2-3 hours  Leakage:  no leakage Pads: Yes: see above  INTERCOURSE: Pain with intercourse:  no pain Climax: erection is not full, but has been improving since surgery  Ejaculation: Yes: no ejuculate, but he does have orgasm   OBJECTIVE:  Note: Objective measures were completed at Evaluation unless otherwise noted. 10/10/23: DIAGNOSTIC FINDINGS:  CT 08/04/2023 and MRI in 06/2023 that showed no concern for recurrence CT scan showed multiple   COGNITION: Overall cognitive status: Within functional limits for tasks assessed     SENSATION: Light touch: Appears intact Proprioception: Appears intact  FUNCTIONAL TESTS:  Squat: WNL Single leg stance:   Rt 2 second balance, opposite pelvic drop  Lt 2 second balance, opposite pelvic drop Chin-up test: no diastasis or distortion  GAIT: Comments: WNL  POSTURE: rounded shoulders, forward head, increased thoracic kyphosis, and posterior pelvic tilt  PELVIC ALIGNMENT: WNL  LUMBARAROM/PROM:  A/PROM A/PROM  Eval % available  Flexion 100  Extension 100  Right lateral flexion 100  Left lateral flexion 100  Right rotation 100  Left rotation 100   (Blank rows = not tested)   PALPATION: GENERAL ileotomy bag; no abdominal tenderness               External Perineal Exam WNL              Internal Pelvic Floor WNL Patient confirms identification and approves PT to assess internal pelvic floor and treatment Yes  PELVIC MMT:   MMT eval  Internal Anal Sphincter 3/5  External Anal Sphincter 2/5  Puborectalis 3/5  Diastasis Recti   (Blank rows = not tested)  TONE: Low in external anal sphincter  TODAY'S TREATMENT:                                                                                                                              DATE:  10/31/23 Neuromuscular re-education: Transversus abdominus  training with multimodal cues for improved motor control and breath coordination Hip  adduction 10x with pelvic floor muscle and transversus abdominus  Bil UE ball press with transversus abdominus and pelvic floor muscle 10x Bridge with hip adduction, pelvic floor muscle and transversus abdominus 2 x 10 Side lying clam shell with UE ball press 2 x 10 bil Bird dog 10x bil Bear crawl plank 10x Exercises: Cat cow 2 x 10 Hip flexor stretch 60 sec bil, kneeling   10/10/23  EVAL  Neuromuscular re-education: Pt provides verbal consent for internal vaginal/rectal pelvic floor exam. Pelvic floor contraction training with rectal feedback Quick flicks Long holds    PATIENT EDUCATION:  Education details: See above Person educated: Patient Education method: Programmer, multimedia, Demonstration, Tactile cues, Verbal cues, and Handouts Education comprehension: verbalized understanding  HOME EXERCISE PROGRAM: [id number]  ASSESSMENT:  CLINICAL IMPRESSION: Pt has done well since his initial visit, noticing some improvement in leakage of mucus from anus. He did very well transversus abdominus training and was able to incorporate into LE/UE activities to make more functional. He had some difficulty with cat cow, wanting to increase hip flexion; this was likely due to anterior/abdominal scar tissue that was restricting movement. He overall tolerated all activities very well and HEP was updated. He will continue to benefit from skilled PT intervention in order to improve pelvic floor/external anal sphincter strength and begin/progress functional strengthening program.   OBJECTIVE IMPAIRMENTS: decreased activity tolerance, decreased coordination, decreased endurance, decreased strength, increased fascial restrictions, increased muscle spasms, impaired tone, postural dysfunction, and pain.   ACTIVITY LIMITATIONS: continence  PARTICIPATION LIMITATIONS: interpersonal relationship, community activity, and occupation  PERSONAL FACTORS: 1 comorbidity: medical history   are also affecting patient's  functional outcome.   REHAB POTENTIAL: Good  CLINICAL DECISION MAKING: Stable/uncomplicated  EVALUATION COMPLEXITY: Low   GOALS: Goals reviewed with patient? Yes  SHORT TERM GOALS: Target date: 11/07/23  Pt will be independent with HEP.   Baseline: Goal status: INITIAL  2.  Pt will be able to perform full pelvic floor muscle and external anal sphincter contraction with appropriate breath coordination. Baseline:  Goal status: INITIAL    LONG TERM GOALS: Target date: 03/26/2024  Pt will be independent with advanced HEP.   Baseline:  Goal status: INITIAL  2.  Pt will demonstrate 4/5 pelvic floor and external anal sphincter contraction strength in order to remain continent with ileostomy reversal.  Baseline:  Goal status: INITIAL  3.  Pt will be able to hold pelvic floor muscle and external anal sphincter contraction for >30 seconds in order to remain continent with ileostomy reversal.  Baseline:  Goal status: INITIAL  4.  Pt will be able to balance on single leg bil for >15 seconds without opposite pelvic drop in order to demonstrate appropriate pelvic stability and functional core strength.  Baseline:  Goal status: INITIAL    PLAN:  PT FREQUENCY: 1-2x/week  PT DURATION: 6 months  PLANNED INTERVENTIONS: 97110-Therapeutic exercises, 97530- Therapeutic activity, [id number]- Neuromuscular re-education, 97535- Self Care, and 16109- Manual therapy  PLAN FOR NEXT SESSION: Core training; progress to functional strengthening activities with deep core/breath coordination.    Julio Alm, PT, DPT11/03/2411:32 PM

## 2023-11-15 ENCOUNTER — Ambulatory Visit: Payer: BC Managed Care – PPO

## 2023-11-15 DIAGNOSIS — R293 Abnormal posture: Secondary | ICD-10-CM | POA: Diagnosis not present

## 2023-11-15 DIAGNOSIS — M6281 Muscle weakness (generalized): Secondary | ICD-10-CM

## 2023-11-15 DIAGNOSIS — R279 Unspecified lack of coordination: Secondary | ICD-10-CM

## 2023-11-15 NOTE — Therapy (Signed)
OUTPATIENT PHYSICAL THERAPY MALE PELVIC TREATMENT   Patient Name: Bryan [name] MRN: [id number] DOB:24-Aug-1971, 52 y.o., male Today's Date: 11/15/2023  END OF SESSION:  PT End of Session - 11/15/23 1109     Visit Number 3    Date for PT Re-Evaluation 03/26/24    Authorization Type [employer]    PT Start Time 1108    PT Stop Time 1146    PT Time Calculation (min) 38 min    Activity Tolerance Patient tolerated treatment well    Behavior During Therapy WFL for tasks assessed/performed              Past Medical History:  Diagnosis Date   Allergic rhinitis, cause unspecified    spring only   Clotting disorder (HCC)    Hypercholesteremia    Pulmonary embolus (HCC) 03/28/2023   CT scan (07/28/2023) now negative for blood clots   Rectal cancer (HCC) 09/14/2021   Past Surgical History:  Procedure Laterality Date   DIVERTING ILEOSTOMY N/A 07/01/2022   Procedure: DIVERTING LOOP ILEOSTOMY;  Surgeon: Andria Meuse, MD;  Location: [facility];  Service: General;  Laterality: N/A;   FLEXIBLE SIGMOIDOSCOPY N/A 07/01/2022   Procedure: FLEXIBLE SIGMOIDOSCOPY;  Surgeon: Andria Meuse, MD;  Location: [facility];  Service: General;  Laterality: N/A;   FLEXIBLE SIGMOIDOSCOPY N/A 02/04/2023   Procedure: DIAGNOSTIC FLEXIBLE SIGMOIDOSCOPY;  Surgeon: Andria Meuse, MD;  Location: [facility];  Service: General;  Laterality: N/A;   FLEXIBLE SIGMOIDOSCOPY N/A 06/02/2023   Procedure: FLEXIBLE SIGMOIDOSCOPY- DIAGNOSTIC;  Surgeon: Andria Meuse, MD;  Location: [facility];  Service: General;  Laterality: N/A;   IR CATHETER TUBE CHANGE  10/08/2022   IR CATHETER TUBE CHANGE  12/03/2022   IR FLUORO RM 30-60 MIN  02/11/2023   IR RADIOLOGIST EVAL & MGMT  08/03/2022   IR RADIOLOGIST EVAL & MGMT  08/17/2022   IR RADIOLOGIST EVAL & MGMT  09/02/2022   IR RADIOLOGIST EVAL & MGMT  09/30/2022   None     XI ROBOTIC ASSISTED LOWER ANTERIOR RESECTION N/A 07/01/2022   Procedure: XI ROBOTIC ASSISTED LOWER ANTERIOR  RESECTION STAPLED COLOANAL ANASTAMOSIS INTRAOP ASSESSMENT OF PERFUSION AND TAPP BLOCKS;  Surgeon: Andria Meuse, MD;  Location: [facility];  Service: General;  Laterality: N/A;   Patient Active Problem List   Diagnosis Date Noted   History of pulmonary embolism 08/07/2023   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   Abdominopelvic abscess (HCC) 07/16/2022   Intra-abdominal abscess (HCC) 07/16/2022   S/P laparoscopic-assisted sigmoidectomy 07/01/2022   Rectal cancer (HCC) 08/18/2021   Routine general medical examination at a health care facility 10/19/2011   Lipoma of abdominal wall 10/19/2011   Tendonitis of elbow, right 10/19/2011   Allergic rhinitis     PCP: Jac Canavan, PA-C  REFERRING PROVIDER: Jac Canavan, Madelin Headings, MD  REFERRING DIAG: C20 (ICD-10-CM) - Rectal cancer [facility])  THERAPY DIAG:  Abnormal posture  Muscle weakness (generalized)  Unspecified lack of coordination  Rationale for Evaluation and Treatment: Rehabilitation  ONSET DATE: October 2022  SUBJECTIVE:  SUBJECTIVE STATEMENT: Pt states that contractions are going well. He feels like he is not able to hold strong for 20 seconds yet, but he is noticing improvement in leakage.   PAIN:  Are you having pain? No   PRECAUTIONS: Other: rectal cancer   RED FLAGS: None   WEIGHT BEARING RESTRICTIONS: No  FALLS:  Has patient fallen in last 6 months? No  LIVING ENVIRONMENT: Lives with: lives with their family Lives in: House/apartment   OCCUPATION: [employer]  PLOF: Independent  PATIENT GOALS: strengthen pelvic floor to prepare for ileostomy reversal   PERTINENT HISTORY:  Rectal cancer, chemo/radiation, surgery for cancer resection (sigmoidectomy)/ileostomy, chemotherapy-induced  peripheral neuropathy, abdominopelvic abscess, history of pulmonary embolism   BOWEL MOVEMENT: Pain with bowel movement: NA Type of bowel movement:NA Fully empty rectum: NA Leakage: Yes: daily leaking of mucous - he can feel the leakage Pads: Yes: when he is at work he wears protection Fiber supplement: No  URINATION: Pain with urination: No Fully empty bladder: Yes: - Stream: Strong Urgency: No Frequency: every 2-3 hours  Leakage:  no leakage Pads: Yes: see above  INTERCOURSE: Pain with intercourse:  no pain Climax: erection is not full, but has been improving since surgery  Ejaculation: Yes: no ejuculate, but he does have orgasm   OBJECTIVE:  Note: Objective measures were completed at Evaluation unless otherwise noted. 10/10/23: DIAGNOSTIC FINDINGS:  CT 08/04/2023 and MRI in 06/2023 that showed no concern for recurrence CT scan showed multiple   COGNITION: Overall cognitive status: Within functional limits for tasks assessed     SENSATION: Light touch: Appears intact Proprioception: Appears intact  FUNCTIONAL TESTS:  Squat: WNL Single leg stance:   Rt 2 second balance, opposite pelvic drop  Lt 2 second balance, opposite pelvic drop Chin-up test: no diastasis or distortion  GAIT: Comments: WNL  POSTURE: rounded shoulders, forward head, increased thoracic kyphosis, and posterior pelvic tilt  PELVIC ALIGNMENT: WNL  LUMBARAROM/PROM:  A/PROM A/PROM  Eval % available  Flexion 100  Extension 100  Right lateral flexion 100  Left lateral flexion 100  Right rotation 100  Left rotation 100   (Blank rows = not tested)   PALPATION: GENERAL ileotomy bag; no abdominal tenderness               External Perineal Exam WNL              Internal Pelvic Floor WNL Patient confirms identification and approves PT to assess internal pelvic floor and treatment Yes  PELVIC MMT:   MMT eval  Internal Anal Sphincter 3/5  External Anal Sphincter 2/5  Puborectalis 3/5   Diastasis Recti   (Blank rows = not tested)  TONE: Low in external anal sphincter  TODAY'S TREATMENT:                                                                                                                              DATE:  11/15/23 Neuromuscular re-education: Clam  shell in side plank 2 x 10 bil Shoulder horizontal abduction in full side plank 2 x 10 bil, 5 lbs Propped sitting on dynadisc russian twist 5lbs 2 x 10 Standing march with single overhead weight hold 2 x 10 bil 10lbs Standing march with anterior 10lb weight hold 2 x 10 Single leg dead lifts 15lbs 10x bil Exercises:  Therapeutic activities:    10/31/23 Neuromuscular re-education: Transversus abdominus training with multimodal cues for improved motor control and breath coordination Hip adduction 10x with pelvic floor muscle and transversus abdominus  Bil UE ball press with transversus abdominus and pelvic floor muscle 10x Bridge with hip adduction, pelvic floor muscle and transversus abdominus 2 x 10 Side lying clam shell with UE ball press 2 x 10 bil Bird dog 10x bil Bear crawl plank 10x Exercises: Cat cow 2 x 10 Hip flexor stretch 60 sec bil, kneeling   10/10/23  EVAL  Neuromuscular re-education: Pt provides verbal consent for internal vaginal/rectal pelvic floor exam. Pelvic floor contraction training with rectal feedback Quick flicks Long holds    PATIENT EDUCATION:  Education details: See above Person educated: Patient Education method: Explanation, Demonstration, Tactile cues, Verbal cues, and Handouts Education comprehension: verbalized understanding  HOME EXERCISE PROGRAM: [id number]  ASSESSMENT:  CLINICAL IMPRESSION: Pt doing very well and is not having any issues with current HEP. He was able to progress to variety of functionally challenging core strengthening activities. He demonstrates difficulty with single leg stance activities, likely due to weak hip abductors and core in this  position. Believe he will benefit form focusing on these activities. We discussed returning to some of his other work out activities, but making sure he progresses slowly, focusing on good breathing and core activation strategies. He will continue to benefit from skilled PT intervention in order to improve pelvic floor/external anal sphincter strength and begin/progress functional strengthening program.   OBJECTIVE IMPAIRMENTS: decreased activity tolerance, decreased coordination, decreased endurance, decreased strength, increased fascial restrictions, increased muscle spasms, impaired tone, postural dysfunction, and pain.   ACTIVITY LIMITATIONS: continence  PARTICIPATION LIMITATIONS: interpersonal relationship, community activity, and occupation  PERSONAL FACTORS: 1 comorbidity: medical history   are also affecting patient's functional outcome.   REHAB POTENTIAL: Good  CLINICAL DECISION MAKING: Stable/uncomplicated  EVALUATION COMPLEXITY: Low   GOALS: Goals reviewed with patient? Yes  SHORT TERM GOALS: Target date: 11/07/23 - updated 11/15/23  Pt will be independent with HEP.   Baseline: Goal status: MET 11/15/23  2.  Pt will be able to perform full pelvic floor muscle and external anal sphincter contraction with appropriate breath coordination. Baseline:  Goal status: MET 11/15/23    LONG TERM GOALS: Target date: 03/26/2024 - updated 11/15/23  Pt will be independent with advanced HEP.   Baseline:  Goal status: IN PROGRESS  2.  Pt will demonstrate 4/5 pelvic floor and external anal sphincter contraction strength in order to remain continent with ileostomy reversal.  Baseline:  Goal status: IN PROGRESS  3.  Pt will be able to hold pelvic floor muscle and external anal sphincter contraction for >30 seconds in order to remain continent with ileostomy reversal.  Baseline:  Goal status: IN PROGRESS  4.  Pt will be able to balance on single leg bil for >15 seconds without  opposite pelvic drop in order to demonstrate appropriate pelvic stability and functional core strength.  Baseline:  Goal status: IN PROGRESS    PLAN:  PT FREQUENCY: 1-2x/week  PT DURATION: 6 months  PLANNED INTERVENTIONS: 97110-Therapeutic exercises, 97530-  Therapeutic activity, [id number]- Neuromuscular re-education, 97535- Self Care, and 16109- Manual therapy  PLAN FOR NEXT SESSION: Progress to functional strengthening activities with deep core/breath coordination.    Julio Alm, PT, DPT11/19/2411:09 AM

## 2023-12-01 ENCOUNTER — Inpatient Hospital Stay: Payer: BC Managed Care – PPO | Attending: Hematology

## 2023-12-01 DIAGNOSIS — Z85048 Personal history of other malignant neoplasm of rectum, rectosigmoid junction, and anus: Secondary | ICD-10-CM | POA: Diagnosis present

## 2023-12-01 DIAGNOSIS — Z08 Encounter for follow-up examination after completed treatment for malignant neoplasm: Secondary | ICD-10-CM | POA: Insufficient documentation

## 2023-12-01 DIAGNOSIS — C20 Malignant neoplasm of rectum: Secondary | ICD-10-CM

## 2023-12-01 DIAGNOSIS — K5902 Outlet dysfunction constipation: Secondary | ICD-10-CM | POA: Insufficient documentation

## 2023-12-01 DIAGNOSIS — R7303 Prediabetes: Secondary | ICD-10-CM | POA: Diagnosis not present

## 2023-12-01 DIAGNOSIS — G620 Drug-induced polyneuropathy: Secondary | ICD-10-CM | POA: Insufficient documentation

## 2023-12-01 LAB — CBC WITH DIFFERENTIAL/PLATELET
Abs Immature Granulocytes: 0.01 10*3/uL (ref 0.00–0.07)
Basophils Absolute: 0 10*3/uL (ref 0.0–0.1)
Basophils Relative: 1 %
Eosinophils Absolute: 0.3 10*3/uL (ref 0.0–0.5)
Eosinophils Relative: 8 %
HCT: 43.7 % (ref 39.0–52.0)
Hemoglobin: 14.6 g/dL (ref 13.0–17.0)
Immature Granulocytes: 0 %
Lymphocytes Relative: 37 %
Lymphs Abs: 1.4 10*3/uL (ref 0.7–4.0)
MCH: 26.4 pg (ref 26.0–34.0)
MCHC: 33.4 g/dL (ref 30.0–36.0)
MCV: 78.9 fL — ABNORMAL LOW (ref 80.0–100.0)
Monocytes Absolute: 0.4 10*3/uL (ref 0.1–1.0)
Monocytes Relative: 11 %
Neutro Abs: 1.6 10*3/uL — ABNORMAL LOW (ref 1.7–7.7)
Neutrophils Relative %: 43 %
Platelets: 236 10*3/uL (ref 150–400)
RBC: 5.54 MIL/uL (ref 4.22–5.81)
RDW: 13.2 % (ref 11.5–15.5)
WBC: 3.7 10*3/uL — ABNORMAL LOW (ref 4.0–10.5)
nRBC: 0 % (ref 0.0–0.2)

## 2023-12-01 LAB — COMPREHENSIVE METABOLIC PANEL
ALT: 32 U/L (ref 0–44)
AST: 20 U/L (ref 15–41)
Albumin: 4.1 g/dL (ref 3.5–5.0)
Alkaline Phosphatase: 99 U/L (ref 38–126)
Anion gap: 6 (ref 5–15)
BUN: 17 mg/dL (ref 6–20)
CO2: 27 mmol/L (ref 22–32)
Calcium: 9 mg/dL (ref 8.9–10.3)
Chloride: 107 mmol/L (ref 98–111)
Creatinine, Ser: 1.22 mg/dL (ref 0.61–1.24)
GFR, Estimated: >60 mL/min (ref >60)
Glucose, Bld: 100 mg/dL — ABNORMAL HIGH (ref 70–99)
Potassium: 4.1 mmol/L (ref 3.5–5.1)
Sodium: 140 mmol/L (ref 135–145)
Total Bilirubin: 0.4 mg/dL (ref <1.2)
Total Protein: 6.6 g/dL (ref 6.5–8.1)

## 2023-12-01 LAB — CEA (IN HOUSE-CHCC): CEA (CHCC-In House): 2.37 ng/mL (ref 0.00–5.00)

## 2023-12-05 ENCOUNTER — Other Ambulatory Visit: Payer: BC Managed Care – PPO

## 2023-12-05 ENCOUNTER — Ambulatory Visit (HOSPITAL_COMMUNITY): Payer: BC Managed Care – PPO

## 2023-12-11 NOTE — Assessment & Plan Note (Signed)
-  cT3b N1 M0, ypT2N0, MMR normal, -Diagnosed in October 2022 -Baseline CEA normal 3.9. -he received total neoadjuvant chemo and radiation from 09/2022-03/2023 --He underwent robotic assisted low anterior resection by Dr. Cliffton Asters on July 01, 2022.  Surgical margins were negative -Surveillance CT scan from August 04, 2023 showed stable diffuse soft tissue thickening in the presacral space, which was evaluated by MRI in July 2024, no concern for local recurrence.  CT scan also showed a 10 mm nodule in lung. -his recent CT scan (done in [place]) on

## 2023-12-12 ENCOUNTER — Inpatient Hospital Stay (HOSPITAL_BASED_OUTPATIENT_CLINIC_OR_DEPARTMENT_OTHER): Payer: BC Managed Care – PPO | Admitting: Hematology

## 2023-12-12 ENCOUNTER — Inpatient Hospital Stay: Payer: BC Managed Care – PPO

## 2023-12-12 ENCOUNTER — Other Ambulatory Visit: Payer: BC Managed Care – PPO

## 2023-12-12 VITALS — BP 112/66 | HR 79 | Temp 98.6°F | Resp 17 | Wt 178.1 lb

## 2023-12-12 DIAGNOSIS — R7302 Impaired glucose tolerance (oral): Secondary | ICD-10-CM

## 2023-12-12 DIAGNOSIS — C20 Malignant neoplasm of rectum: Secondary | ICD-10-CM | POA: Diagnosis not present

## 2023-12-12 DIAGNOSIS — Z08 Encounter for follow-up examination after completed treatment for malignant neoplasm: Secondary | ICD-10-CM | POA: Diagnosis not present

## 2023-12-13 LAB — HEMOGLOBIN A1C
Hgb A1c MFr Bld: 6.3 % — ABNORMAL HIGH (ref 4.8–5.6)
Mean Plasma Glucose: 134.11 mg/dL

## 2023-12-13 NOTE — Progress Notes (Signed)
[facility]   [phone] [phone]   Clinic Follow up Note   Patient Care Team: Tysinger, Kermit Balo, PA-C as PCP - General (Family Medicine) Andria Meuse, MD as Consulting Physician (General Surgery) Malachy Mood, MD as Consulting Physician (Hematology) Dorothy Puffer, MD as Consulting Physician (Radiation Oncology) Pollyann Samples, NP as Nurse Practitioner (Nurse Practitioner) Marily Lente, RN (Inactive) as Registered Nurse Carmina Miller, MD as Consulting Physician (Radiation Oncology) Weyman Croon Radiology, MD as Rounding Team (Interventional Radiology)  Date of Service:  12/13/2023  CHIEF COMPLAINT: f/u of rectal cancer  CURRENT THERAPY:  Cancer surveillance  Oncology History   Rectal cancer (HCC) -cT3b N1 M0, ypT2N0, MMR normal, -Diagnosed in October 2022 -Baseline CEA normal 3.9. -he received total neoadjuvant chemo and radiation from 09/2022-03/2023 --He underwent robotic assisted low anterior resection by Dr. Cliffton Asters on July 01, 2022.  Surgical margins were negative -Surveillance CT scan from August 04, 2023 showed stable diffuse soft tissue thickening in the presacral space, which was evaluated by MRI in July 2024, no concern for local recurrence.  CT scan also showed a 10 mm nodule in lung. -his recent CT chest scan (done in [facility]) on 09/05/2023 was negative for PE and showed multiple tiny lung nodules up to 5mm, no high concern for metastasis, will repeat CT in 3 months      Assessment and Plan    Rectal Cancer Surveillance 52 year old on surveillance for rectal cancer. Recent scan shows no recurrence. Previous lung nodules resolved. Small stable liver lesion (6 mm in the left lobe), likely benign. No residual issues from prior surgery, chemotherapy, or radiation. Neuropathy improving, primarily in toes. Active and gained 6 pounds. Discussed decreasing recurrence likelihood over time, with annual follow-up scans for five  years. Emphasized importance of continued surveillance and regular follow-ups. - Continue surveillance - Schedule next scan in one year  - Follow-up in four months  Pelvic Floor Dysfunction Undergoing physical therapy for pelvic floor strengthening in preparation for ostomy reversal. Two sessions completed, two remaining. Temporary ostomy due to previous infection and abscess. 2 cm of rectum remaining, which may affect continence post-reversal. Discussed that most patients do well post-reversal, though some may experience loose bowel movements or incontinence. Only one patient in the past nine to ten years required reversion due to severe diarrhea. - Continue pelvic floor physical therapy - Plan for ostomy reversal after sufficient pelvic floor strengthening - Follow-up with Dr. 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 Symptoms improving. Discussed benefits of physical activity and B12 supplementation for nerve recovery. - Encourage physical activity and exercise for better circulation - Consider over-the-counter B12 supplementation for nerve recovery  Borderline Diabetes Previous A1c was 6.2, indicating borderline diabetes. No recent follow-up with primary care physician. Discussed importance of regular A1c monitoring, especially given age and history. - Order A1c test today - Encourage follow-up with primary care physician for regular monitoring  General Health Maintenance Last colonoscopy in September, up to date. Routine lab work (CBC, kidney, liver function, tumor marker CA) normal. - Continue routine health maintenance - Encourage regular follow-up with primary care physician  Follow-up -Lab and recent CT scan reviewed, NED - schedule follow-up appointment in four months - Schedule next scan in one year.         SUMMARY OF ONCOLOGIC HISTORY: Oncology History  Rectal cancer (HCC)  08/18/2021 Initial Diagnosis   Rectal  cancer (HCC)   09/14/2021 Procedure   Colonoscopy  by Dr. Tiajuana Amass -The perianal examination was normal. - The digital rectal exam revealed a firm rectal mass palpated 1-2 cm from the anal verge. The mass was non-circumferential and located predominantly at the left bowel wall. - An ulcerated non-obstructing medium-sized friable mass was found in the distal rectum. The mass was non-circumferential. The mass measured three cm in length. No bleeding was present. Biopsies (5 passes total for 10 total biopsy specimens)were taken with a cold forceps for histology. Estimated blood loss was minimal. - The exam was otherwise normal throughout the examined colon. - The terminal ileum appeared normal. - No additional abnormalities were found on retroflexion   09/14/2021 Initial Biopsy   Diagnosis Rectum, biopsy, mass - INVASIVE ADENOCARCINOMA.  MMR normal  Baseline CEA 3.9     09/17/2021 Imaging   CT CAP IMPRESSION: 1. Known left rectal mass measures approximately 3.3 x 1.9 x 4.1 cm and is associated with asymmetric haziness in the left perirectal fat and small left perirectal lymph nodes. 2. No findings highly suspicious for distant metastatic disease. There are 2 small indeterminate low-density hepatic lesions. Consider MRI for further characterization. 3. No suspicious findings in the chest. Small perifissural nodules bilaterally, likely benign.   09/25/2021 Imaging      Outside liver and pelvic MRI   10/13/2021 Cancer Staging   Staging form: Colon and Rectum, AJCC 8th Edition - Clinical stage from 10/13/2021: Stage IIIB (cT3, cN1, cM0) - Signed by Malachy Mood, MD on 10/23/2021 Stage prefix: Initial diagnosis   10/23/2021 - 02/05/2022 Chemotherapy   Patient is on Treatment Plan : RECTAL Xelox (Capeox) q21d x 6 cycles     02/18/2022 Imaging   CT CHEST ABDOMEN PELVIS W IV CONTRAST   Impression  IMPRESSION:  1.  Soft tissue mass along the left lateral aspect of the rectum and  adjacent infiltration of the perirectal fat on the left, measures approximately 2.5 x 3.9 cm.  2.  There is no adenopathy or evidence of distant metastatic disease.   3.  Hepatic steatosis.       Discussed the use of AI scribe software for clinical note transcription with the patient, who gave verbal consent to proceed.  History of Present Illness   The patient, a 52 year old with a history of rectal cancer, is currently on surveillance. He reports feeling well overall, with no new symptoms or concerns. He has been adhering to his surveillance plan, including regular scans. His most recent scan showed resolution of previously noted lung nodules, and stable small lesions in the liver. He has no new symptoms or concerns related to his cancer.  The patient has residual neuropathy from previous chemotherapy, which has improved significantly. The neuropathy is now mostly limited to his toes. He remains active, engaging in regular walking and stair climbing at work. He has also been participating in physical therapy for his pelvic floor in preparation for a potential ostomy reversal. He reports no other residual problems from his previous surgery, chemotherapy, or radiation.  The patient's wife is actively involved in his care and accompanies him to his appointments. She reports that he has been doing well with his pelvic floor physical therapy and is working towards getting in the best shape possible for a potential ostomy reversal. She also mentions that he has been taking over-the-counter B12 supplements for his neuropathy.         All other systems were reviewed with the patient and are negative.  MEDICAL HISTORY:  Past Medical History:  Diagnosis Date   Allergic rhinitis, cause unspecified    spring only   Clotting disorder (HCC)    Hypercholesteremia    Pulmonary embolus (HCC) 03/28/2023   CT scan (07/28/2023) now negative for blood clots   Rectal cancer (HCC) 09/14/2021     SURGICAL HISTORY: Past Surgical History:  Procedure Laterality Date   DIVERTING ILEOSTOMY N/A 07/01/2022   Procedure: DIVERTING LOOP ILEOSTOMY;  Surgeon: Andria Meuse, MD;  Location: [facility];  Service: General;  Laterality: N/A;   FLEXIBLE SIGMOIDOSCOPY N/A 07/01/2022   Procedure: FLEXIBLE SIGMOIDOSCOPY;  Surgeon: Andria Meuse, MD;  Location: [facility];  Service: General;  Laterality: N/A;   FLEXIBLE SIGMOIDOSCOPY N/A 02/04/2023   Procedure: DIAGNOSTIC FLEXIBLE SIGMOIDOSCOPY;  Surgeon: Andria Meuse, MD;  Location: [facility];  Service: General;  Laterality: N/A;   FLEXIBLE SIGMOIDOSCOPY N/A 06/02/2023   Procedure: FLEXIBLE SIGMOIDOSCOPY- DIAGNOSTIC;  Surgeon: Andria Meuse, MD;  Location: [facility];  Service: General;  Laterality: N/A;   IR CATHETER TUBE CHANGE  10/08/2022   IR CATHETER TUBE CHANGE  12/03/2022   IR FLUORO RM 30-60 MIN  02/11/2023   IR RADIOLOGIST EVAL & MGMT  08/03/2022   IR RADIOLOGIST EVAL & MGMT  08/17/2022   IR RADIOLOGIST EVAL & MGMT  09/02/2022   IR RADIOLOGIST EVAL & MGMT  09/30/2022   None     XI ROBOTIC ASSISTED LOWER ANTERIOR RESECTION N/A 07/01/2022   Procedure: XI ROBOTIC ASSISTED LOWER ANTERIOR RESECTION STAPLED COLOANAL ANASTAMOSIS INTRAOP ASSESSMENT OF PERFUSION AND TAPP BLOCKS;  Surgeon: Andria Meuse, MD;  Location: [facility];  Service: General;  Laterality: N/A;    I have reviewed the social history and family history with the patient and they are unchanged from previous note.  ALLERGIES:  has no known allergies.  MEDICATIONS:  Current Outpatient Medications  Medication Sig Dispense Refill   apixaban (ELIQUIS) 5 MG TABS tablet Take 1 tablet (5 mg total) by mouth 2 (two) times daily. 60 tablet 0   Cyanocobalamin (B-12) 5000 MCG CAPS Take 5,000 mcg by mouth daily. (Patient not taking: Reported on 09/21/2023)     No current facility-administered medications for this visit.    PHYSICAL EXAMINATION: ECOG PERFORMANCE STATUS: 0 -  Asymptomatic  Vitals:   12/12/23 1346  BP: 112/66  Pulse: 79  Resp: 17  Temp: 98.6 F (37 C)  SpO2: 99%   Wt Readings from Last 3 Encounters:  12/12/23 178 lb 1.6 oz (80.8 kg)  09/21/23 172 lb (78 kg)  09/13/23 172 lb 4.8 oz (78.2 kg)     GENERAL:alert, no distress and comfortable SKIN: skin color, texture, turgor are normal, no rashes or significant lesions EYES: normal, Conjunctiva are pink and non-injected, sclera clear NECK: supple, thyroid normal size, non-tender, without nodularity LYMPH:  no palpable lymphadenopathy in the cervical, axillary  LUNGS: clear to auscultation and percussion with normal breathing effort HEART: regular rate & rhythm and no murmurs and no lower extremity edema ABDOMEN:abdomen soft, non-tender and normal bowel sounds Musculoskeletal:no cyanosis of digits and no clubbing  NEURO: alert & oriented x 3 with fluent speech, no focal motor/sensory deficits   LABORATORY DATA:  I have reviewed the data as listed    Latest Ref Rng & Units 12/01/2023    1:08 PM 08/01/2023   12:32 PM 04/11/2023    1:09 PM  CBC  WBC 4.0 - 10.5 K/uL 3.7  4.0  3.8   Hemoglobin 13.0 - 17.0 g/dL 66.0  63.0  14.0  Hematocrit 39.0 - 52.0 % 43.7  44.7  42.2   Platelets 150 - 400 K/uL 236  235  248         Latest Ref Rng & Units 12/01/2023    1:08 PM 08/01/2023   12:32 PM 04/11/2023    1:09 PM  CMP  Glucose 70 - 99 mg/dL 161  096  93   BUN 6 - 20 mg/dL 17  15  15    Creatinine 0.61 - 1.24 mg/dL 0.45  4.09  8.11   Sodium 135 - 145 mmol/L 140  138  139   Potassium 3.5 - 5.1 mmol/L 4.1  4.3  3.9   Chloride 98 - 111 mmol/L 107  104  108   CO2 22 - 32 mmol/L 27  27  25    Calcium 8.9 - 10.3 mg/dL 9.0  9.2  9.4   Total Protein 6.5 - 8.1 g/dL 6.6  7.1  6.9   Total Bilirubin <1.2 mg/dL 0.4  0.6  0.4   Alkaline Phos 38 - 126 U/L 99  96  112   AST 15 - 41 U/L 20  37  16   ALT 0 - 44 U/L 32  37  30       RADIOGRAPHIC STUDIES: I have personally reviewed the radiological  images as listed and agreed with the findings in the report. No results found.    Orders Placed This Encounter  Procedures   Hemoglobin A1c    Standing Status:   Future    Number of Occurrences:   1    Expected Date:   12/12/2023    Expiration Date:   12/11/2024   All questions were answered. The patient knows to call the clinic with any problems, questions or concerns. No barriers to learning was detected. The total time spent in the appointment was 25 minutes.     Malachy Mood, MD 12/13/2023

## 2023-12-23 ENCOUNTER — Other Ambulatory Visit (HOSPITAL_COMMUNITY): Payer: Self-pay | Admitting: Hematology

## 2023-12-23 ENCOUNTER — Inpatient Hospital Stay
Admission: RE | Admit: 2023-12-23 | Discharge: 2023-12-23 | Disposition: A | Discharge status: Home / Self Care | Payer: Self-pay | Source: Ambulatory Visit | Attending: Hematology | Admitting: Hematology

## 2023-12-23 ENCOUNTER — Inpatient Hospital Stay
Admission: RE | Admit: 2023-12-23 | Discharge: 2023-12-23 | Disposition: A | Discharge status: Home / Self Care | Payer: Self-pay | Source: Ambulatory Visit | Attending: Hematology

## 2023-12-23 ENCOUNTER — Other Ambulatory Visit: Payer: Self-pay

## 2023-12-23 DIAGNOSIS — C20 Malignant neoplasm of rectum: Secondary | ICD-10-CM

## 2023-12-23 DIAGNOSIS — I2699 Other pulmonary embolism without acute cor pulmonale: Secondary | ICD-10-CM

## 2024-01-11 ENCOUNTER — Ambulatory Visit: Payer: BC Managed Care – PPO | Attending: Hematology

## 2024-01-11 DIAGNOSIS — R279 Unspecified lack of coordination: Secondary | ICD-10-CM | POA: Diagnosis present

## 2024-01-11 DIAGNOSIS — R293 Abnormal posture: Secondary | ICD-10-CM | POA: Insufficient documentation

## 2024-01-11 DIAGNOSIS — M6281 Muscle weakness (generalized): Secondary | ICD-10-CM | POA: Insufficient documentation

## 2024-01-11 NOTE — Therapy (Signed)
 OUTPATIENT PHYSICAL THERAPY MALE PELVIC TREATMENT   Patient Name: Bryan [name] MRN: [id number] DOB:[birth year], 53 y.o., male Today's Date: 01/11/2024  END OF SESSION:  PT End of Session - 01/11/24 1106     Visit Number 4    Date for PT Re-Evaluation 03/26/24    Authorization Type BCBS    PT Start Time 1105    PT Stop Time 1143    PT Time Calculation (min) 38 min    Activity Tolerance Patient tolerated treatment well    Behavior During Therapy WFL for tasks assessed/performed              Past Medical History:  Diagnosis Date   Allergic rhinitis, cause unspecified    spring only   Clotting disorder (HCC)    Hypercholesteremia    Pulmonary embolus (HCC) 03/28/2023   CT scan (07/28/2023) now negative for blood clots   Rectal cancer (HCC) 09/14/2021   Past Surgical History:  Procedure Laterality Date   DIVERTING ILEOSTOMY N/A 07/01/2022   Procedure: DIVERTING LOOP ILEOSTOMY;  Surgeon: Melvenia Stabs, MD;  Location: [facility];  Service: General;  Laterality: N/A;   FLEXIBLE SIGMOIDOSCOPY N/A 07/01/2022   Procedure: FLEXIBLE SIGMOIDOSCOPY;  Surgeon: Melvenia Stabs, MD;  Location: [facility];  Service: General;  Laterality: N/A;   FLEXIBLE SIGMOIDOSCOPY N/A 02/04/2023   Procedure: DIAGNOSTIC FLEXIBLE SIGMOIDOSCOPY;  Surgeon: Melvenia Stabs, MD;  Location: [facility];  Service: General;  Laterality: N/A;   FLEXIBLE SIGMOIDOSCOPY N/A 06/02/2023   Procedure: FLEXIBLE SIGMOIDOSCOPY- DIAGNOSTIC;  Surgeon: Melvenia Stabs, MD;  Location: [facility];  Service: General;  Laterality: N/A;   IR CATHETER TUBE CHANGE  10/08/2022   IR CATHETER TUBE CHANGE  12/03/2022   IR FLUORO RM 30-60 MIN  02/11/2023   IR RADIOLOGIST EVAL & MGMT  08/03/2022   IR RADIOLOGIST EVAL & MGMT  08/17/2022   IR RADIOLOGIST EVAL & MGMT  09/02/2022   IR RADIOLOGIST EVAL & MGMT  09/30/2022   None     XI ROBOTIC ASSISTED LOWER ANTERIOR RESECTION N/A 07/01/2022   Procedure: XI ROBOTIC ASSISTED LOWER ANTERIOR  RESECTION STAPLED COLOANAL ANASTAMOSIS INTRAOP ASSESSMENT OF PERFUSION AND TAPP BLOCKS;  Surgeon: Melvenia Stabs, MD;  Location: [facility];  Service: General;  Laterality: N/A;   Patient Active Problem List   Diagnosis Date Noted   History of pulmonary embolism 08/07/2023   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   Abdominopelvic abscess (HCC) 07/16/2022   Intra-abdominal abscess (HCC) 07/16/2022   S/P laparoscopic-assisted sigmoidectomy 07/01/2022   Rectal cancer (HCC) 08/18/2021   Routine general medical examination at a health care facility 10/19/2011   Lipoma of abdominal wall 10/19/2011   Tendonitis of elbow, right 10/19/2011   Allergic rhinitis     PCP: Claudene Crystal, PA-C  REFERRING PROVIDER: Claudene Crystal, Geary Kells, MD  REFERRING DIAG: C20 (ICD-10-CM) - Rectal cancer [facility])  THERAPY DIAG:  Abnormal posture  Muscle weakness (generalized)  Unspecified lack of coordination  Rationale for Evaluation and Treatment: Rehabilitation  ONSET DATE: October 2022  SUBJECTIVE:  SUBJECTIVE STATEMENT: Pt states that he is doing very well. He feels like he is seeing improvement in leakage from his rectum. He reports seeing leakage throughout the day still, but is only using protection when he is at work. He is working on exercises. He states that the most challenging exercise is [place] dead lifts because his balance is bad.   PAIN:  Are you having pain? No   PRECAUTIONS: Other: rectal cancer   RED FLAGS: None   WEIGHT BEARING RESTRICTIONS: No  FALLS:  Has patient fallen in last 6 months? No  LIVING ENVIRONMENT: Lives with: lives with their family Lives in: House/apartment   OCCUPATION: [employer]  PLOF: Independent  PATIENT GOALS: strengthen pelvic  floor to prepare for ileostomy reversal   PERTINENT HISTORY:  Rectal cancer, chemo/radiation, surgery for cancer resection (sigmoidectomy)/ileostomy, chemotherapy-induced peripheral neuropathy, abdominopelvic abscess, history of pulmonary embolism   BOWEL MOVEMENT: Pain with bowel movement: NA Type of bowel movement:NA Fully empty rectum: NA Leakage: Yes: daily leaking of mucous - he can feel the leakage Pads: Yes: when he is at work he wears protection Fiber supplement: No  URINATION: Pain with urination: No Fully empty bladder: Yes: - Stream: Strong Urgency: No Frequency: every 2-3 hours  Leakage:  no leakage Pads: Yes: see above  INTERCOURSE: Pain with intercourse:  no pain Climax: erection is not full, but has been improving since surgery  Ejaculation: Yes: no ejuculate, but he does have orgasm   OBJECTIVE:  Note: Objective measures were completed at Evaluation unless otherwise noted. 01/11/24 Single leg stance: >15 seconds bil without pelvic drop Improved pressure management with exercise and bed mobility, not holding breath  10/10/23: DIAGNOSTIC FINDINGS:  CT 08/04/2023 and MRI in 06/2023 that showed no concern for recurrence CT scan showed multiple   COGNITION: Overall cognitive status: Within functional limits for tasks assessed     SENSATION: Light touch: Appears intact Proprioception: Appears intact  FUNCTIONAL TESTS:  Squat: WNL Single leg stance:   Rt 2 second balance, opposite pelvic drop  Lt 2 second balance, opposite pelvic drop Chin-up test: no diastasis or distortion  GAIT: Comments: WNL  POSTURE: rounded shoulders, forward head, increased thoracic kyphosis, and posterior pelvic tilt  PELVIC ALIGNMENT: WNL  LUMBARAROM/PROM:  A/PROM A/PROM  Eval % available  Flexion 100  Extension 100  Right lateral flexion 100  Left lateral flexion 100  Right rotation 100  Left rotation 100   (Blank rows = not tested)   PALPATION: GENERAL ileotomy  bag; no abdominal tenderness               External Perineal Exam WNL              Internal Pelvic Floor WNL Patient confirms identification and approves PT to assess internal pelvic floor and treatment Yes  PELVIC MMT:   MMT eval  Internal Anal Sphincter 3/5  External Anal Sphincter 2/5  Puborectalis 3/5  Diastasis Recti   (Blank rows = not tested)  TONE: Low in external anal sphincter  TODAY'S TREATMENT:  DATE:  01/11/24 Neuromuscular re-education: Bridge with hip adduction, transversus abdominus, and pelvic floor muscle 2 x 10 Modified dead bug 10x bil Deadbug 2 x 10 Kneeling shoulder flexion bil 5lbs 2 x 10 Kneeling chop 5lbs 10x bil Single leg dead lifts 10lbs 10x bil   11-28-2023 Neuromuscular re-education: Clam shell in side plank 2 x 10 bil Shoulder horizontal abduction in full side plank 2 x 10 bil, 5 lbs Propped sitting on dynadisc russian twist 5lbs 2 x 10 Standing march with single overhead weight hold 2 x 10 bil 10lbs Standing march with anterior 10lb weight hold 2 x 10 Single leg dead lifts 15lbs 10x bil   11-13-2023 Neuromuscular re-education: Transversus abdominus training with multimodal cues for improved motor control and breath coordination Hip adduction 10x with pelvic floor muscle and transversus abdominus  Bil UE ball press with transversus abdominus and pelvic floor muscle 10x Bridge with hip adduction, pelvic floor muscle and transversus abdominus 2 x 10 Side lying clam shell with UE ball press 2 x 10 bil Bird dog 10x bil Bear crawl plank 10x Exercises: Cat cow 2 x 10 Hip flexor stretch 60 sec bil, kneeling   PATIENT EDUCATION:  Education details: See above Person educated: Patient Education method: Explanation, Demonstration, Tactile cues, Verbal cues, and Handouts Education comprehension: verbalized  understanding  HOME EXERCISE PROGRAM: [id number]  ASSESSMENT:  CLINICAL IMPRESSION: Pt doing well with less fecal leaking of mucous. He reports feeling like he is ready for discharge, having good exercises to work on to keep making progress. He states that he feels like he has met his goals for being here. He did well with all exercise progressions and HEP was updated. Due to progress and being pleased with current functional level, he is prepared to D/C at this time.   OBJECTIVE IMPAIRMENTS: decreased activity tolerance, decreased coordination, decreased endurance, decreased strength, increased fascial restrictions, increased muscle spasms, impaired tone, postural dysfunction, and pain.   ACTIVITY LIMITATIONS: continence  PARTICIPATION LIMITATIONS: interpersonal relationship, community activity, and occupation  PERSONAL FACTORS: 1 comorbidity: medical history   are also affecting patient's functional outcome.   REHAB POTENTIAL: Good  CLINICAL DECISION MAKING: Stable/uncomplicated  EVALUATION COMPLEXITY: Low   GOALS: Goals reviewed with patient? Yes  SHORT TERM GOALS: Target date: 11/07/23 - updated 28-Nov-2023 - 01/11/24  Pt will be independent with HEP.   Baseline: Goal status: MET 28-Nov-2023  2.  Pt will be able to perform full pelvic floor muscle and external anal sphincter contraction with appropriate breath coordination. Baseline:  Goal status: MET Nov 28, 2023    LONG TERM GOALS: Target date: 03/26/2024 - updated 11/28/23 - updated 01/11/24  Pt will be independent with advanced HEP.   Baseline:  Goal status: MET 01/11/24  2.  Pt will demonstrate 4/5 pelvic floor and external anal sphincter contraction strength in order to remain continent with ileostomy reversal.  Baseline: no formal assessment due to improvement Goal status: DISCHARGED 01/11/24  3.  Pt will be able to hold pelvic floor muscle and external anal sphincter contraction for >30 seconds in order to remain  continent with ileostomy reversal.  Baseline: no formal assessment due to improvement Goal status: DISCHARGED 01/11/24  4.  Pt will be able to balance on single leg bil for >15 seconds without opposite pelvic drop in order to demonstrate appropriate pelvic stability and functional core strength.  Baseline:  Goal status: MET 01/11/24    PLAN:  PT FREQUENCY: -  PT DURATION:-  PLANNED INTERVENTIONS: -  PLAN  FOR NEXT SESSION: DC  PHYSICAL THERAPY DISCHARGE SUMMARY  Visits from Start of Care: 4  Current functional level related to goals / functional outcomes: MET   Remaining deficits: See above   Education / Equipment: HEP   Patient agrees to discharge. Patient goals were partially met. Patient is being discharged due to being pleased with the current functional level.   Verlena Glenn, PT, DPT01/15/2511:38 AM

## 2024-04-01 NOTE — Assessment & Plan Note (Signed)
-  cT3b N1 M0, ypT2N0, MMR normal, -Diagnosed in October 2022 -Baseline CEA normal 3.9. -he received total neoadjuvant chemo and radiation from 09/2022-03/2023 --He underwent robotic assisted low anterior resection by Dr. Cliffton Asters on July 01, 2022.  Surgical margins were negative -Surveillance CT scan from August 04, 2023 showed stable diffuse soft tissue thickening in the presacral space, which was evaluated by MRI in July 2024, no concern for local recurrence.  CT scan also showed a 10 mm nodule in lung. -his recent CT chest scan (done in [facility]) on 09/05/2023 was negative for PE and showed multiple tiny lung nodules up to 5mm, no high concern for metastasis, repeated CT in 11/2023 was stable

## 2024-04-02 ENCOUNTER — Encounter: Payer: Self-pay | Admitting: Hematology

## 2024-04-02 ENCOUNTER — Inpatient Hospital Stay (HOSPITAL_BASED_OUTPATIENT_CLINIC_OR_DEPARTMENT_OTHER): Payer: BC Managed Care – PPO | Admitting: Hematology

## 2024-04-02 ENCOUNTER — Inpatient Hospital Stay: Payer: BC Managed Care – PPO | Attending: Hematology

## 2024-04-02 ENCOUNTER — Other Ambulatory Visit: Payer: Self-pay

## 2024-04-02 VITALS — BP 128/83 | HR 62 | Temp 97.6°F | Resp 18 | Ht 70.0 in | Wt 180.5 lb

## 2024-04-02 DIAGNOSIS — R739 Hyperglycemia, unspecified: Secondary | ICD-10-CM | POA: Diagnosis not present

## 2024-04-02 DIAGNOSIS — G629 Polyneuropathy, unspecified: Secondary | ICD-10-CM | POA: Insufficient documentation

## 2024-04-02 DIAGNOSIS — R7303 Prediabetes: Secondary | ICD-10-CM | POA: Insufficient documentation

## 2024-04-02 DIAGNOSIS — C20 Malignant neoplasm of rectum: Secondary | ICD-10-CM

## 2024-04-02 DIAGNOSIS — Z86711 Personal history of pulmonary embolism: Secondary | ICD-10-CM | POA: Insufficient documentation

## 2024-04-02 DIAGNOSIS — Z08 Encounter for follow-up examination after completed treatment for malignant neoplasm: Secondary | ICD-10-CM | POA: Diagnosis present

## 2024-04-02 DIAGNOSIS — Z85048 Personal history of other malignant neoplasm of rectum, rectosigmoid junction, and anus: Secondary | ICD-10-CM | POA: Diagnosis present

## 2024-04-02 LAB — HEMOGLOBIN A1C
Hgb A1c MFr Bld: 6.3 % — ABNORMAL HIGH (ref 4.8–5.6)
Mean Plasma Glucose: 134.11 mg/dL

## 2024-04-02 LAB — CBC WITH DIFFERENTIAL/PLATELET
Abs Immature Granulocytes: 0.01 10*3/uL (ref 0.00–0.07)
Basophils Absolute: 0.1 10*3/uL (ref 0.0–0.1)
Basophils Relative: 1 %
Eosinophils Absolute: 0.3 10*3/uL (ref 0.0–0.5)
Eosinophils Relative: 7 %
HCT: 42.7 % (ref 39.0–52.0)
Hemoglobin: 14.4 g/dL (ref 13.0–17.0)
Immature Granulocytes: 0 %
Lymphocytes Relative: 26 %
Lymphs Abs: 1.1 10*3/uL (ref 0.7–4.0)
MCH: 26.2 pg (ref 26.0–34.0)
MCHC: 33.7 g/dL (ref 30.0–36.0)
MCV: 77.6 fL — ABNORMAL LOW (ref 80.0–100.0)
Monocytes Absolute: 0.4 10*3/uL (ref 0.1–1.0)
Monocytes Relative: 9 %
Neutro Abs: 2.3 10*3/uL (ref 1.7–7.7)
Neutrophils Relative %: 57 %
Platelets: 261 10*3/uL (ref 150–400)
RBC: 5.5 MIL/uL (ref 4.22–5.81)
RDW: 13.6 % (ref 11.5–15.5)
WBC: 4.1 10*3/uL (ref 4.0–10.5)
nRBC: 0 % (ref 0.0–0.2)

## 2024-04-02 LAB — COMPREHENSIVE METABOLIC PANEL WITH GFR
ALT: 28 U/L (ref 0–44)
AST: 18 U/L (ref 15–41)
Albumin: 4.2 g/dL (ref 3.5–5.0)
Alkaline Phosphatase: 101 U/L (ref 38–126)
Anion gap: 6 (ref 5–15)
BUN: 17 mg/dL (ref 6–20)
CO2: 29 mmol/L (ref 22–32)
Calcium: 9.2 mg/dL (ref 8.9–10.3)
Chloride: 105 mmol/L (ref 98–111)
Creatinine, Ser: 1.22 mg/dL (ref 0.61–1.24)
GFR, Estimated: >60 mL/min (ref >60)
Glucose, Bld: 112 mg/dL — ABNORMAL HIGH (ref 70–99)
Potassium: 4.2 mmol/L (ref 3.5–5.1)
Sodium: 140 mmol/L (ref 135–145)
Total Bilirubin: 0.5 mg/dL (ref 0.0–1.2)
Total Protein: 7 g/dL (ref 6.5–8.1)

## 2024-04-02 LAB — CEA (ACCESS): CEA (CHCC): 2.78 ng/mL (ref 0.00–5.00)

## 2024-04-02 NOTE — Progress Notes (Signed)
 [facility]   Telephone:(336) [phone] Fax:(336) [phone]   Clinic Follow up Note   Patient Care Team: Tysinger, Kermit Balo, PA-C as PCP - General (Family Medicine) Andria Meuse, MD as Consulting Physician (General Surgery) Malachy Mood, MD as Consulting Physician (Hematology) Dorothy Puffer, MD as Consulting Physician (Radiation Oncology) Pollyann Samples, NP as Nurse Practitioner (Nurse Practitioner) Marily Lente, RN (Inactive) as Registered Nurse Carmina Miller, MD as Consulting Physician (Radiation Oncology) Weyman Croon Radiology, MD as Rounding Team (Interventional Radiology)  Date of Service:  04/02/2024  CHIEF COMPLAINT: f/u of rectal cancer  CURRENT THERAPY:  Surveillance  Oncology History   Rectal cancer (HCC) -cT3b N1 M0, ypT2N0, MMR normal, -Diagnosed in October 2022 -Baseline CEA normal 3.9. -he received total neoadjuvant chemo and radiation from 09/2022-03/2023 --He underwent robotic assisted low anterior resection by Dr. Cliffton Asters on July 01, 2022.  Surgical margins were negative -Surveillance CT scan from August 04, 2023 showed stable diffuse soft tissue thickening in the presacral space, which was evaluated by MRI in July 2024, no concern for local recurrence.  CT scan also showed a 10 mm nodule in lung. -his recent CT chest scan (done in [facility]) on 09/05/2023 was negative for PE and showed multiple tiny lung nodules up to 5mm, no high concern for metastasis, repeated CT in 11/2023 was stable    Assessment and Plan    Rectal cancer 53 year old male with rectal cancer, diagnosed two and a half years ago. Completed treatment a year ago in April. Reports no gastrointestinal issues or bleeding. Manages a colostomy bag, with normal mucus discharge. Pelvic physical therapy improved his condition, but he is not ready for reversal surgery. Last CT scan in December showed well-managed lung nodules, with a follow-up scan due in August. Discussed the  importance of bowel control before considering reversal surgery. - Schedule CT scan in August - Continue pelvic exercises - Consider reversal surgery when bowel control is adequate  Peripheral neuropathy Experiences tingling in feet, occasionally aggravated, particularly in the right foot. No symptoms in hands. Likely a long-term side effect of chemotherapy. - Maintain physical activity  Prediabetes Prediabetic and requested an A1c test for monitoring. Not on any antidiabetic medications. - Order A1c test  Resolved pulmonary embolism Previously had pulmonary embolism, treated with Eliquis. Condition resolved, and he is no longer on anticoagulation therapy. - Maintain physical activity     Plan -Patient is clinically doing well, exam was unremarkable, no clinical concern for recurrence -Follow-up in 4 months, with lab here and CT scan at [facility] facility 1 to 2 weeks before next visit    SUMMARY OF ONCOLOGIC HISTORY: Oncology History  Rectal cancer (HCC)  08/18/2021 Initial Diagnosis   Rectal cancer (HCC)   09/14/2021 Procedure   Colonoscopy by Dr. Tiajuana Amass -The perianal examination was normal. - The digital rectal exam revealed a firm rectal mass palpated 1-2 cm from the anal verge. The mass was non-circumferential and located predominantly at the left bowel wall. - An ulcerated non-obstructing medium-sized friable mass was found in the distal rectum. The mass was non-circumferential. The mass measured three cm in length. No bleeding was present. Biopsies (5 passes total for 10 total biopsy specimens)were taken with a cold forceps for histology. Estimated blood loss was minimal. - The exam was otherwise normal throughout the examined colon. - The terminal ileum appeared normal. - No additional abnormalities were found on retroflexion   09/14/2021 Initial Biopsy   Diagnosis Rectum, biopsy, mass - INVASIVE  ADENOCARCINOMA.  MMR normal  Baseline CEA 3.9      09/17/2021 Imaging   CT CAP IMPRESSION: 1. Known left rectal mass measures approximately 3.3 x 1.9 x 4.1 cm and is associated with asymmetric haziness in the left perirectal fat and small left perirectal lymph nodes. 2. No findings highly suspicious for distant metastatic disease. There are 2 small indeterminate low-density hepatic lesions. Consider MRI for further characterization. 3. No suspicious findings in the chest. Small perifissural nodules bilaterally, likely benign.   09/25/2021 Imaging      Outside liver and pelvic MRI   10/13/2021 Cancer Staging   Staging form: Colon and Rectum, AJCC 8th Edition - Clinical stage from 10/13/2021: Stage IIIB (cT3, cN1, cM0) - Signed by Malachy Mood, MD on 10/23/2021 Stage prefix: Initial diagnosis   10/23/2021 - 02/05/2022 Chemotherapy   Patient is on Treatment Plan : RECTAL Xelox (Capeox) q21d x 6 cycles     02/18/2022 Imaging   CT CHEST ABDOMEN PELVIS W IV CONTRAST   Impression  IMPRESSION:  1.  Soft tissue mass along the left lateral aspect of the rectum and adjacent infiltration of the perirectal fat on the left, measures approximately 2.5 x 3.9 cm.  2.  There is no adenopathy or evidence of distant metastatic disease.   3.  Hepatic steatosis.       Discussed the use of AI scribe software for clinical note transcription with the patient, who gave verbal consent to proceed.  History of Present Illness   The patient, a 53 year old survivor of rectal cancer, reports overall good health with regular bowel movements via a colostomy bag. The frequency of emptying the bag varies depending on food intake. The patient denies any stomach issues or blood in the stool. The patient also reports tingling in the feet, which is a long-term side effect from chemotherapy and radiation. The tingling was aggravated in the right foot the previous night, but overall, the patient reports it as manageable. The patient also reports that the hand tremors,  another side effect from the treatment, have completely resolved.  The patient has been undergoing pelvic physical therapy, which has been beneficial. The patient is considering a reversal surgery for the colostomy bag but does not feel ready yet. The patient is still working on the exercises from the pelvic physical therapy and reports some mucus leakage, which the patient is unsure if it indicates a lack of sphincter control.  The patient is also prediabetic and has a history of blood clots in the lungs, which have resolved. The patient's last CT scan was in December of the previous year, which showed stable lung nodules. The patient is due for another CT scan in four months.         All other systems were reviewed with the patient and are negative.  MEDICAL HISTORY:  Past Medical History:  Diagnosis Date   Allergic rhinitis, cause unspecified    spring only   Clotting disorder (HCC)    Hypercholesteremia    Pulmonary embolus (HCC) 03/28/2023   CT scan (07/28/2023) now negative for blood clots   Rectal cancer (HCC) 09/14/2021    SURGICAL HISTORY: Past Surgical History:  Procedure Laterality Date   DIVERTING ILEOSTOMY N/A 07/01/2022   Procedure: DIVERTING LOOP ILEOSTOMY;  Surgeon: Andria Meuse, MD;  Location: [facility];  Service: General;  Laterality: N/A;   FLEXIBLE SIGMOIDOSCOPY N/A 07/01/2022   Procedure: Arnell Sieving;  Surgeon: Andria Meuse, MD;  Location: [facility];  Service: General;  Laterality: N/A;   FLEXIBLE SIGMOIDOSCOPY N/A 02/04/2023   Procedure: DIAGNOSTIC FLEXIBLE SIGMOIDOSCOPY;  Surgeon: Andria Meuse, MD;  Location: [facility];  Service: General;  Laterality: N/A;   FLEXIBLE SIGMOIDOSCOPY N/A 06/02/2023   Procedure: FLEXIBLE SIGMOIDOSCOPY- DIAGNOSTIC;  Surgeon: Andria Meuse, MD;  Location: [facility];  Service: General;  Laterality: N/A;   IR CATHETER TUBE CHANGE  10/08/2022   IR CATHETER TUBE CHANGE  12/03/2022   IR FLUORO RM 30-60  MIN  02/11/2023   IR RADIOLOGIST EVAL & MGMT  08/03/2022   IR RADIOLOGIST EVAL & MGMT  08/17/2022   IR RADIOLOGIST EVAL & MGMT  09/02/2022   IR RADIOLOGIST EVAL & MGMT  09/30/2022   None     XI ROBOTIC ASSISTED LOWER ANTERIOR RESECTION N/A 07/01/2022   Procedure: XI ROBOTIC ASSISTED LOWER ANTERIOR RESECTION STAPLED COLOANAL ANASTAMOSIS INTRAOP ASSESSMENT OF PERFUSION AND TAPP BLOCKS;  Surgeon: Andria Meuse, MD;  Location: [facility];  Service: General;  Laterality: N/A;    I have reviewed the social history and family history with the patient and they are unchanged from previous note.  ALLERGIES:  has no known allergies.  MEDICATIONS:  No current outpatient medications on file.   No current facility-administered medications for this visit.    PHYSICAL EXAMINATION: ECOG PERFORMANCE STATUS: 0 - Asymptomatic  Vitals:   04/02/24 1335  BP: 128/83  Pulse: 62  Resp: 18  Temp: 97.6 F (36.4 C)  SpO2: 100%   Wt Readings from Last 3 Encounters:  04/02/24 180 lb 8 oz (81.9 kg)  12/12/23 178 lb 1.6 oz (80.8 kg)  09/21/23 172 lb (78 kg)     GENERAL:alert, no distress and comfortable SKIN: skin color, texture, turgor are normal, no rashes or significant lesions EYES: normal, Conjunctiva are pink and non-injected, sclera clear NECK: supple, thyroid normal size, non-tender, without nodularity LYMPH:  no palpable lymphadenopathy in the cervical, axillary  LUNGS: clear to auscultation and percussion with normal breathing effort HEART: regular rate & rhythm and no murmurs and no lower extremity edema ABDOMEN:abdomen soft, non-tender and normal bowel sounds Musculoskeletal:no cyanosis of digits and no clubbing  NEURO: alert & oriented x 3 with fluent speech, no focal motor/sensory deficits RECTAL: Rectal exam normal, no tenderness. Scar tissue present, no suspicious masses. Anal sphincter muscle loose.      LABORATORY DATA:  I have reviewed the data as listed    Latest Ref Rng & Units  04/02/2024    1:15 PM 12/01/2023    1:08 PM 08/01/2023   12:32 PM  CBC  WBC 4.0 - 10.5 K/uL 4.1  3.7  4.0   Hemoglobin 13.0 - 17.0 g/dL 40.9  81.1  91.4   Hematocrit 39.0 - 52.0 % 42.7  43.7  44.7   Platelets 150 - 400 K/uL 261  236  235         Latest Ref Rng & Units 04/02/2024    1:15 PM 12/01/2023    1:08 PM 08/01/2023   12:32 PM  CMP  Glucose 70 - 99 mg/dL 782  956  213   BUN 6 - 20 mg/dL 17  17  15    Creatinine 0.61 - 1.24 mg/dL 0.86  5.78  4.69   Sodium 135 - 145 mmol/L 140  140  138   Potassium 3.5 - 5.1 mmol/L 4.2  4.1  4.3   Chloride 98 - 111 mmol/L 105  107  104   CO2 22 - 32 mmol/L 29  27  27   Calcium 8.9 - 10.3 mg/dL 9.2  9.0  9.2   Total Protein 6.5 - 8.1 g/dL 7.0  6.6  7.1   Total Bilirubin 0.0 - 1.2 mg/dL 0.5  0.4  0.6   Alkaline Phos 38 - 126 U/L 101  99  96   AST 15 - 41 U/L 18  20  37   ALT 0 - 44 U/L 28  32  37       RADIOGRAPHIC STUDIES: I have personally reviewed the radiological images as listed and agreed with the findings in the report. No results found.    Orders Placed This Encounter  Procedures   Hemoglobin A1c    Standing Status:   Future    Number of Occurrences:   1    Expected Date:   04/02/2024    Expiration Date:   04/02/2025   All questions were answered. The patient knows to call the clinic with any problems, questions or concerns. No barriers to learning was detected. The total time spent in the appointment was 20 minutes.     Malachy Mood, MD 04/02/2024

## 2024-07-12 ENCOUNTER — Other Ambulatory Visit: Payer: Self-pay

## 2024-07-12 ENCOUNTER — Telehealth: Payer: Self-pay

## 2024-07-12 DIAGNOSIS — C20 Malignant neoplasm of rectum: Secondary | ICD-10-CM

## 2024-07-12 NOTE — Telephone Encounter (Signed)
 Received telephone call from [facility] requesting the order for patient's ct scan to be faxed to F# [phone].  Order placed and faxed, confirmation received.

## 2024-07-27 ENCOUNTER — Other Ambulatory Visit: Payer: Self-pay

## 2024-07-27 DIAGNOSIS — C20 Malignant neoplasm of rectum: Secondary | ICD-10-CM

## 2024-07-30 ENCOUNTER — Ambulatory Visit
Admission: RE | Admit: 2024-07-30 | Discharge: 2024-07-30 | Disposition: A | Discharge status: Home / Self Care | Payer: Self-pay | Source: Ambulatory Visit | Attending: Hematology | Admitting: Hematology

## 2024-07-30 ENCOUNTER — Other Ambulatory Visit: Payer: Self-pay

## 2024-07-30 ENCOUNTER — Telehealth: Payer: Self-pay

## 2024-07-30 ENCOUNTER — Inpatient Hospital Stay: Attending: Hematology

## 2024-07-30 ENCOUNTER — Inpatient Hospital Stay (HOSPITAL_BASED_OUTPATIENT_CLINIC_OR_DEPARTMENT_OTHER): Admitting: Hematology

## 2024-07-30 ENCOUNTER — Inpatient Hospital Stay

## 2024-07-30 ENCOUNTER — Ambulatory Visit
Admission: RE | Admit: 2024-07-30 | Discharge: 2024-07-30 | Disposition: A | Discharge status: Home / Self Care | Payer: Self-pay | Source: Ambulatory Visit | Attending: Hematology

## 2024-07-30 VITALS — BP 122/76 | HR 84 | Temp 98.6°F | Resp 16 | Ht 70.0 in | Wt 170.7 lb

## 2024-07-30 DIAGNOSIS — Z86711 Personal history of pulmonary embolism: Secondary | ICD-10-CM | POA: Diagnosis not present

## 2024-07-30 DIAGNOSIS — C20 Malignant neoplasm of rectum: Secondary | ICD-10-CM

## 2024-07-30 DIAGNOSIS — Z7901 Long term (current) use of anticoagulants: Secondary | ICD-10-CM | POA: Insufficient documentation

## 2024-07-30 DIAGNOSIS — R918 Other nonspecific abnormal finding of lung field: Secondary | ICD-10-CM | POA: Insufficient documentation

## 2024-07-30 LAB — CBC WITH DIFFERENTIAL (CANCER CENTER ONLY)
Abs Immature Granulocytes: 0.01 K/uL (ref 0.00–0.07)
Basophils Absolute: 0.1 K/uL (ref 0.0–0.1)
Basophils Relative: 2 %
Eosinophils Absolute: 0.5 K/uL (ref 0.0–0.5)
Eosinophils Relative: 13 %
HCT: 41.1 % (ref 39.0–52.0)
Hemoglobin: 13.8 g/dL (ref 13.0–17.0)
Immature Granulocytes: 0 %
Lymphocytes Relative: 25 %
Lymphs Abs: 0.9 K/uL (ref 0.7–4.0)
MCH: 25.8 pg — ABNORMAL LOW (ref 26.0–34.0)
MCHC: 33.6 g/dL (ref 30.0–36.0)
MCV: 76.8 fL — ABNORMAL LOW (ref 80.0–100.0)
Monocytes Absolute: 0.4 K/uL (ref 0.1–1.0)
Monocytes Relative: 10 %
Neutro Abs: 1.9 K/uL (ref 1.7–7.7)
Neutrophils Relative %: 50 %
Platelet Count: 193 K/uL (ref 150–400)
RBC: 5.35 MIL/uL (ref 4.22–5.81)
RDW: 13.8 % (ref 11.5–15.5)
WBC Count: 3.7 K/uL — ABNORMAL LOW (ref 4.0–10.5)
nRBC: 0 % (ref 0.0–0.2)

## 2024-07-30 LAB — CMP (CANCER CENTER ONLY)
ALT: 24 U/L (ref 0–44)
AST: 16 U/L (ref 15–41)
Albumin: 4.2 g/dL (ref 3.5–5.0)
Alkaline Phosphatase: 101 U/L (ref 38–126)
Anion gap: 3 — ABNORMAL LOW (ref 5–15)
BUN: 15 mg/dL (ref 6–20)
CO2: 28 mmol/L (ref 22–32)
Calcium: 8.9 mg/dL (ref 8.9–10.3)
Chloride: 104 mmol/L (ref 98–111)
Creatinine: 1.2 mg/dL (ref 0.61–1.24)
GFR, Estimated: >60 mL/min (ref >60)
Glucose, Bld: 112 mg/dL — ABNORMAL HIGH (ref 70–99)
Potassium: 4.4 mmol/L (ref 3.5–5.1)
Sodium: 135 mmol/L (ref 135–145)
Total Bilirubin: 0.5 mg/dL (ref 0.0–1.2)
Total Protein: 6.8 g/dL (ref 6.5–8.1)

## 2024-07-30 LAB — CEA (ACCESS): CEA (CHCC): 3.41 ng/mL (ref 0.00–5.00)

## 2024-07-30 MED ORDER — APIXABAN (ELIQUIS) VTE STARTER PACK (10MG AND 5MG): ORAL_TABLET | ORAL | 0 refills | Status: DC

## 2024-07-30 NOTE — Telephone Encounter (Signed)
 Ambulatory walk - 6-minute walk test on room air, per Dr. Lanny. HR: 72, 96% HR: 74, 97% HR: 76, 91% HR: 87, 92% Made Dr. Lanny aware.

## 2024-07-30 NOTE — Progress Notes (Signed)
 Verbal order with readback from Dr. Lanny for Guardant Reveal once.  Order placed and pt will get Guardant Reveal drawn today while in clinic.

## 2024-07-30 NOTE — Progress Notes (Unsigned)
 [facility]   Telephone:(336) [phone] Fax:(336) [phone]   Clinic Follow up Note   Patient Care Team: Tysinger, Alm RAMAN, PA-C as PCP - General (Family Medicine) Teresa Lonni HERO, MD as Consulting Physician (General Surgery) Lanny Callander, MD as Consulting Physician (Hematology) Dewey Rush, MD as Consulting Physician (Radiation Oncology) [name], NP as Nurse Practitioner (Nurse Practitioner) [name], RN (Inactive) as Registered Nurse [name], MD as Consulting Physician (Radiation Oncology) [name], MD as Rounding Team (Interventional Radiology)  Date of Service:  07/30/2024  CHIEF COMPLAINT: f/u of rectal cancer  CURRENT THERAPY:  Cancer surveillance  Oncology History   Rectal cancer (HCC) -cT3b N1 M0, ypT2N0, MMR normal, -Diagnosed in October 2022 -Baseline CEA normal 3.9. -he received total neoadjuvant chemo and radiation from 09/2022-03/2023 --He underwent robotic assisted low anterior resection by Dr. Teresa on July 01, 2022.  Surgical margins were negative -Surveillance CT scan from August 04, 2023 showed stable diffuse soft tissue thickening in the presacral space, which was evaluated by MRI in July 2024, no concern for local recurrence.  CT scan also showed a 10 mm nodule in lung. -his recent CT chest scan (done in [facility]) on 09/05/2023 was negative for PE and showed multiple tiny lung nodules up to 5mm, no high concern for metastasis, repeated CT in 11/2023 was stable   History of pulmonary embolism This was incidental finding on his surveillance CT scan on March 31, 2023, he was asymptomatic.  Workup was negative for cancer recurrence at that time. -he completed 6 months of anticoagulation Eliquis  - He had a recurrent small PE's on his CT on July 30, 2024, will restart Eliquis  with loading dose, and I recommend lifelong anticoagulation.  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 No evidence of right heart strain, hypoxia, or significant symptoms such as dyspnea or chest pain. Differential includes idiopathic causes, potential cancer recurrence, or genetic predisposition. Lifelong anticoagulation is recommended due to recurrent nature. - Initiate anticoagulation with Eliquis  (apixaban ) 2 tablets twice daily for 7 days, then 1 tablet twice daily indefinitely. - Order circulating tumor DNA test (Guardian review) to assess for cancer recurrence. - Schedule follow-up in 3 months to assess response to anticoagulation and any issues with Eliquis . - Instruct to call for Eliquis  refill after the first month of treatment.  Pulmonary nodules and ground glass opacity of the right upper lobe CT scan shows new small poorly defined nodule in the right upper lobe measuring 8 mm with ground glass opacity. Additional tiny 2 mm nodules noted, likely benign. No signs of pneumonia or infection. Ground glass changes likely inflammatory and not of immediate concern. - Repeat CT scan in 6 months to monitor pulmonary nodules and ground glass opacity.  Rectal cancer  Cancer diagnosed in October 2022, currently in remission for 3 years. Previous circulating tumor DNA test in April was negative. Low likelihood of recurrence given the time elapsed since diagnosis and negative previous test. - Repeat circulating tumor DNA test (Guardian review) to confirm remission status.  Plan - I reviewed his CT scan report from [facility], which showed bilateral small PEs, patient is asymptomatic, no hypoxia on 6-minute walk in the office.  No evidence of cancer recurrence on CT. - I prescribed Eliquis  with loading dose, he will start today - Lab and follow-up in 3 months.   SUMMARY OF ONCOLOGIC HISTORY: Oncology History  Rectal cancer (HCC)  08/18/2021 Initial Diagnosis   Rectal cancer (HCC)   09/14/2021  Procedure   Colonoscopy by Dr. [name] -The perianal examination was normal. - The digital rectal exam revealed a firm rectal mass palpated 1-2 cm from the anal verge. The mass was non-circumferential and located predominantly at the left bowel wall. - An ulcerated non-obstructing medium-sized friable mass was found in the distal rectum. The mass was non-circumferential. The mass measured three cm in length. No bleeding was present. Biopsies (5 passes total for 10 total biopsy specimens)were taken with a cold forceps for histology. Estimated blood loss was minimal. - The exam was otherwise normal throughout the examined colon. - The terminal ileum appeared normal. - No additional abnormalities were found on retroflexion   09/14/2021 Initial Biopsy   Diagnosis Rectum, biopsy, mass - INVASIVE ADENOCARCINOMA.  MMR normal  Baseline CEA 3.9     09/17/2021 Imaging   CT CAP IMPRESSION: 1. Known left rectal mass measures approximately 3.3 x 1.9 x 4.1 cm and is associated with asymmetric haziness in the left perirectal fat and small left perirectal lymph nodes. 2. No findings highly suspicious for distant metastatic disease. There are 2 small indeterminate low-density hepatic lesions. Consider MRI for further characterization. 3. No suspicious findings in the chest. Small perifissural nodules bilaterally, likely benign.   09/25/2021 Imaging      Outside liver and pelvic MRI   10/13/2021 Cancer Staging   Staging form: Colon and Rectum, AJCC 8th Edition - Clinical stage from 10/13/2021: Stage IIIB (cT3, cN1, cM0) - Signed by Lanny Callander, MD on 10/23/2021 Stage prefix: Initial diagnosis   10/23/2021 - 02/05/2022 Chemotherapy   Patient is on Treatment Plan : RECTAL Xelox (Capeox) q21d x 6 cycles     02/18/2022 Imaging   CT CHEST ABDOMEN PELVIS W IV CONTRAST   Impression  IMPRESSION:  1.  Soft tissue mass along the left lateral aspect of the rectum and adjacent infiltration of the  perirectal fat on the left, measures approximately 2.5 x 3.9 cm.  2.  There is no adenopathy or evidence of distant metastatic disease.   3.  Hepatic steatosis.       Discussed the use of AI scribe software for clinical note transcription with the patient, who gave verbal consent to proceed.  History of Present Illness Bryan [name] is a 53 year old male with a history of pulmonary embolism who presents with blood clots in the lungs discovered on a routine CT scan.  He experiences increased fatigue and mild dyspnea after exertion, which resolved with rest. No persistent shortness of breath or fatigue during routine activities. No leg swelling or pain.  He has a history of pulmonary embolism and discontinued anticoagulation therapy in November last year. He denies recent injuries, surgeries, or prolonged immobility. He has no history of smoking.  He was diagnosed with cancer in October 2022. A circulating tumor DNA test in April was negative. He has no symptoms suggestive of cancer recurrence.  His current medications include anticoagulants. He has never had a port.     All other systems were reviewed with the patient and are negative.  MEDICAL HISTORY:  Past Medical History:  Diagnosis Date   Allergic rhinitis, cause unspecified    spring only   Clotting disorder [facility])    Hypercholesteremia    Pulmonary embolus (HCC) 03/28/2023   CT scan (07/28/2023) now negative for blood clots   Rectal cancer (HCC) 09/14/2021    SURGICAL HISTORY: Past  Surgical History:  Procedure Laterality Date   DIVERTING ILEOSTOMY N/A 07/01/2022   Procedure: DIVERTING LOOP ILEOSTOMY;  Surgeon: Teresa Lonni HERO, MD;  Location: [facility];  Service: General;  Laterality: N/A;   FLEXIBLE SIGMOIDOSCOPY N/A 07/01/2022   Procedure: FLEXIBLE SIGMOIDOSCOPY;  Surgeon: Teresa Lonni HERO, MD;  Location: [facility];  Service: General;  Laterality: N/A;   FLEXIBLE SIGMOIDOSCOPY N/A 02/04/2023   Procedure: DIAGNOSTIC  FLEXIBLE SIGMOIDOSCOPY;  Surgeon: Teresa Lonni HERO, MD;  Location: [facility];  Service: General;  Laterality: N/A;   FLEXIBLE SIGMOIDOSCOPY N/A 06/02/2023   Procedure: FLEXIBLE SIGMOIDOSCOPY- DIAGNOSTIC;  Surgeon: Teresa Lonni HERO, MD;  Location: [facility];  Service: General;  Laterality: N/A;   IR CATHETER TUBE CHANGE  10/08/2022   IR CATHETER TUBE CHANGE  12/03/2022   IR FLUORO RM 30-60 MIN  02/11/2023   IR RADIOLOGIST EVAL & MGMT  08/03/2022   IR RADIOLOGIST EVAL & MGMT  08/17/2022   IR RADIOLOGIST EVAL & MGMT  09/02/2022   IR RADIOLOGIST EVAL & MGMT  09/30/2022   None     XI ROBOTIC ASSISTED LOWER ANTERIOR RESECTION N/A 07/01/2022   Procedure: XI ROBOTIC ASSISTED LOWER ANTERIOR RESECTION STAPLED COLOANAL ANASTAMOSIS INTRAOP ASSESSMENT OF PERFUSION AND TAPP BLOCKS;  Surgeon: Teresa Lonni HERO, MD;  Location: [facility];  Service: General;  Laterality: N/A;    I have reviewed the social history and family history with the patient and they are unchanged from previous note.  ALLERGIES:  has no known allergies.  MEDICATIONS:  Current Outpatient Medications  Medication Sig Dispense Refill   APIXABAN  (ELIQUIS ) VTE STARTER PACK (10MG  AND 5MG ) Take as directed on package: start with two-5mg  tablets twice daily for 7 days. On day 8, switch to one-5mg  tablet twice daily. 74 each 0   No current facility-administered medications for this visit.    PHYSICAL EXAMINATION: ECOG PERFORMANCE STATUS: 0 - Asymptomatic  Vitals:   07/30/24 1344  BP: 122/76  Pulse: 84  Resp: 16  Temp: 98.6 F (37 C)  SpO2: 97%   Wt Readings from Last 3 Encounters:  07/30/24 170 lb 11.2 oz (77.4 kg)  04/02/24 180 lb 8 oz (81.9 kg)  12/12/23 178 lb 1.6 oz (80.8 kg)     GENERAL:alert, no distress and comfortable SKIN: skin color, texture, turgor are normal, no rashes or significant lesions EYES: normal, Conjunctiva are pink and non-injected, sclera clear Musculoskeletal:no cyanosis of digits and no clubbing   NEURO: alert & oriented x 3 with fluent speech, no focal motor/sensory deficits  Physical Exam EXTREMITIES: No swelling in extremities.  LABORATORY DATA:  I have reviewed the data as listed    Latest Ref Rng & Units 07/30/2024    1:06 PM 04/02/2024    1:15 PM 12/01/2023    1:08 PM  CBC  WBC 4.0 - 10.5 K/uL 3.7  4.1  3.7   Hemoglobin 13.0 - 17.0 g/dL 86.1  85.5  85.3   Hematocrit 39.0 - 52.0 % 41.1  42.7  43.7   Platelets 150 - 400 K/uL 193  261  236         Latest Ref Rng & Units 07/30/2024    1:06 PM 04/02/2024    1:15 PM 12/01/2023    1:08 PM  CMP  Glucose 70 - 99 mg/dL 887  887  899   BUN 6 - 20 mg/dL 15  17  17    Creatinine 0.61 - 1.24 mg/dL 8.79  8.77  8.77   Sodium 135 -  145 mmol/L 135  140  140   Potassium 3.5 - 5.1 mmol/L 4.4  4.2  4.1   Chloride 98 - 111 mmol/L 104  105  107   CO2 22 - 32 mmol/L 28  29  27    Calcium 8.9 - 10.3 mg/dL 8.9  9.2  9.0   Total Protein 6.5 - 8.1 g/dL 6.8  7.0  6.6   Total Bilirubin 0.0 - 1.2 mg/dL 0.5  0.5  0.4   Alkaline Phos 38 - 126 U/L 101  101  99   AST 15 - 41 U/L 16  18  20    ALT 0 - 44 U/L 24  28  32       RADIOGRAPHIC STUDIES: I have personally reviewed the radiological images as listed and agreed with the findings in the report. No results found.    No orders of the defined types were placed in this encounter.  All questions were answered. The patient knows to call the clinic with any problems, questions or concerns. No barriers to learning was detected. The total time spent in the appointment was 25 minutes, including review of chart and various tests results, discussions about plan of care and coordination of care plan     Onita Mattock, MD 07/30/2024

## 2024-07-30 NOTE — Telephone Encounter (Signed)
 STAT report called to Dr. Demetra office from [facility] [phone]).  Report states: There is multifocal acute bilateral pulmonary embolus, most proximally at the lobar level on the left.  No evidence of right heart strain.  Interval development of tiny nonspecific pulmonary nodules bilaterally.  Recommend follow-up chest CT in 3-6 months.  Status post low anterior resection with unchanged presacral soft tissue thickening, possibly treatment related.  No new findings in the abdomen and pelvis to suggest metastatic disease.  Requested is report could be faxed to Dr. Demetra office.  [facility] stated they would fax the report.  Notified Dr. Lanny of the STAT report called.  Pt's wife dropped off Disk with images of CT Scan with [facility] registration.  Retrieved disk to take to Radiology to have images loaded in EPIC.  Provided Dr. Lanny with both faxed report of pt's CT Scan today and disk.  Dr. Lanny stated to have pt come in for a 6 min walk and possible workup in ED if pt is symptomatic of SOB.  Contacted pt via telephone.  Pt was currently still in [facility] lab having blood drawn.  Had pt to remain in [facility] because Dr. Lanny would like to speak with pt regarding his CT Scan done today.  Pt agreed to stay to speak with Dr. Lanny.  Scheduled an appt for pt to see Dr. Lanny today.

## 2024-07-31 ENCOUNTER — Other Ambulatory Visit (HOSPITAL_COMMUNITY): Payer: Self-pay | Admitting: Hematology

## 2024-07-31 ENCOUNTER — Inpatient Hospital Stay
Admission: RE | Admit: 2024-07-31 | Discharge: 2024-07-31 | Disposition: A | Discharge status: Home / Self Care | Payer: Self-pay | Source: Ambulatory Visit | Attending: Hematology | Admitting: Hematology

## 2024-07-31 DIAGNOSIS — I2699 Other pulmonary embolism without acute cor pulmonale: Secondary | ICD-10-CM

## 2024-07-31 NOTE — Assessment & Plan Note (Signed)
 This was incidental finding on his surveillance CT scan on March 31, 2023, he was asymptomatic.  Workup was negative for cancer recurrence at that time. -he completed 6 months of anticoagulation Eliquis  - He had a recurrent small PE's on his CT on July 30, 2024, will restart Eliquis  with loading dose, and I recommend lifelong anticoagulation.

## 2024-07-31 NOTE — Assessment & Plan Note (Signed)
-  cT3b N1 M0, ypT2N0, MMR normal, -Diagnosed in October 2022 -Baseline CEA normal 3.9. -he received total neoadjuvant chemo and radiation from 09/2022-03/2023 --He underwent robotic assisted low anterior resection by Dr. Cliffton Asters on July 01, 2022.  Surgical margins were negative -Surveillance CT scan from August 04, 2023 showed stable diffuse soft tissue thickening in the presacral space, which was evaluated by MRI in July 2024, no concern for local recurrence.  CT scan also showed a 10 mm nodule in lung. -his recent CT chest scan (done in [facility]) on 09/05/2023 was negative for PE and showed multiple tiny lung nodules up to 5mm, no high concern for metastasis, repeated CT in 11/2023 was stable

## 2024-08-08 ENCOUNTER — Encounter: Payer: Self-pay | Admitting: Hematology

## 2024-08-08 LAB — GUARDANT REVEAL

## 2024-08-09 ENCOUNTER — Ambulatory Visit: Admitting: Hematology

## 2024-08-13 ENCOUNTER — Encounter: Payer: Self-pay | Admitting: Hematology

## 2024-08-24 ENCOUNTER — Other Ambulatory Visit: Payer: Self-pay | Admitting: Hematology

## 2024-08-24 ENCOUNTER — Encounter: Payer: Self-pay | Admitting: Hematology

## 2024-08-28 ENCOUNTER — Other Ambulatory Visit: Payer: Self-pay | Admitting: Hematology

## 2024-08-28 MED ORDER — APIXABAN 5 MG PO TABS: 5.0000 mg | ORAL_TABLET | Freq: Two times a day (BID) | ORAL | 2 refills | Status: DC

## 2024-10-28 NOTE — Assessment & Plan Note (Signed)
-  cT3b N1 M0, ypT2N0, MMR normal, -Diagnosed in October 2022 -Baseline CEA normal 3.9. -he received total neoadjuvant chemo and radiation from 09/2022-03/2023 --He underwent robotic assisted low anterior resection by Dr. Cliffton Asters on July 01, 2022.  Surgical margins were negative -Surveillance CT scan from August 04, 2023 showed stable diffuse soft tissue thickening in the presacral space, which was evaluated by MRI in July 2024, no concern for local recurrence.  CT scan also showed a 10 mm nodule in lung. -his recent CT chest scan (done in [facility]) on 09/05/2023 was negative for PE and showed multiple tiny lung nodules up to 5mm, no high concern for metastasis, repeated CT in 11/2023 was stable

## 2024-10-29 ENCOUNTER — Inpatient Hospital Stay (HOSPITAL_BASED_OUTPATIENT_CLINIC_OR_DEPARTMENT_OTHER): Admitting: Hematology

## 2024-10-29 ENCOUNTER — Inpatient Hospital Stay: Attending: Hematology

## 2024-10-29 VITALS — BP 130/84 | HR 77 | Temp 97.8°F | Resp 17 | Ht 70.0 in | Wt 177.3 lb

## 2024-10-29 DIAGNOSIS — Z85048 Personal history of other malignant neoplasm of rectum, rectosigmoid junction, and anus: Secondary | ICD-10-CM | POA: Diagnosis present

## 2024-10-29 DIAGNOSIS — C20 Malignant neoplasm of rectum: Secondary | ICD-10-CM | POA: Diagnosis not present

## 2024-10-29 DIAGNOSIS — G620 Drug-induced polyneuropathy: Secondary | ICD-10-CM | POA: Diagnosis not present

## 2024-10-29 DIAGNOSIS — N529 Male erectile dysfunction, unspecified: Secondary | ICD-10-CM | POA: Insufficient documentation

## 2024-10-29 DIAGNOSIS — Z08 Encounter for follow-up examination after completed treatment for malignant neoplasm: Secondary | ICD-10-CM | POA: Diagnosis present

## 2024-10-29 LAB — CMP (CANCER CENTER ONLY)
ALT: 21 U/L (ref 0–44)
AST: 15 U/L (ref 15–41)
Albumin: 4.2 g/dL (ref 3.5–5.0)
Alkaline Phosphatase: 90 U/L (ref 38–126)
Anion gap: 6 (ref 5–15)
BUN: 15 mg/dL (ref 6–20)
CO2: 28 mmol/L (ref 22–32)
Calcium: 9.3 mg/dL (ref 8.9–10.3)
Chloride: 104 mmol/L (ref 98–111)
Creatinine: 1.35 mg/dL — ABNORMAL HIGH (ref 0.61–1.24)
GFR, Estimated: >60 mL/min (ref >60)
Glucose, Bld: 104 mg/dL — ABNORMAL HIGH (ref 70–99)
Potassium: 4.6 mmol/L (ref 3.5–5.1)
Sodium: 138 mmol/L (ref 135–145)
Total Bilirubin: 0.6 mg/dL (ref 0.0–1.2)
Total Protein: 7 g/dL (ref 6.5–8.1)

## 2024-10-29 LAB — CBC WITH DIFFERENTIAL (CANCER CENTER ONLY)
Abs Immature Granulocytes: 0.01 K/uL (ref 0.00–0.07)
Basophils Absolute: 0.1 K/uL (ref 0.0–0.1)
Basophils Relative: 1 %
Eosinophils Absolute: 0.4 K/uL (ref 0.0–0.5)
Eosinophils Relative: 9 %
HCT: 42.8 % (ref 39.0–52.0)
Hemoglobin: 14.6 g/dL (ref 13.0–17.0)
Immature Granulocytes: 0 %
Lymphocytes Relative: 39 %
Lymphs Abs: 1.6 K/uL (ref 0.7–4.0)
MCH: 26.1 pg (ref 26.0–34.0)
MCHC: 34.1 g/dL (ref 30.0–36.0)
MCV: 76.6 fL — ABNORMAL LOW (ref 80.0–100.0)
Monocytes Absolute: 0.4 K/uL (ref 0.1–1.0)
Monocytes Relative: 10 %
Neutro Abs: 1.7 K/uL (ref 1.7–7.7)
Neutrophils Relative %: 41 %
Platelet Count: 240 K/uL (ref 150–400)
RBC: 5.59 MIL/uL (ref 4.22–5.81)
RDW: 13.8 % (ref 11.5–15.5)
WBC Count: 4.2 K/uL (ref 4.0–10.5)
nRBC: 0 % (ref 0.0–0.2)

## 2024-10-29 LAB — CEA (ACCESS): CEA (CHCC): 3.01 ng/mL (ref 0.00–5.00)

## 2024-10-29 MED ORDER — APIXABAN 5 MG PO TABS: 5.0000 mg | ORAL_TABLET | Freq: Two times a day (BID) | ORAL | 1 refills | Status: AC

## 2024-10-29 NOTE — Progress Notes (Signed)
 [name]   Telephone:([phone] [phone]   Clinic Follow up Note   Patient Care Team: Tysinger, Alm RAMAN, PA-C as PCP - General (Family Medicine) Teresa Lonni HERO, MD as Consulting Physician (General Surgery) Lanny Callander, MD as Consulting Physician (Hematology) Dewey Rush, MD as Consulting Physician (Radiation Oncology) Burton, Lacie K, NP as Nurse Practitioner (Nurse Practitioner) [name], RN (Inactive) as Registered Nurse [name], MD as Consulting Physician (Radiation Oncology) [name], MD as Rounding Team (Interventional Radiology)  Date of Service:  10/29/2024  CHIEF COMPLAINT: f/u of rectal cancer  CURRENT THERAPY:  Observation  Oncology History   Rectal cancer (HCC) -cT3b N1 M0, ypT2N0, MMR normal, -Diagnosed in October 2022 -Baseline CEA normal 3.9. -he received total neoadjuvant chemo and radiation from 09/2022-03/2023 --He underwent robotic assisted low anterior resection by Dr. Teresa on July 01, 2022.  Surgical margins were negative -Surveillance CT scan from August 04, 2023 showed stable diffuse soft tissue thickening in the presacral space, which was evaluated by MRI in July 2024, no concern for local recurrence.  CT scan also showed a 10 mm nodule in lung. -his recent CT chest scan (done in [facility]) on 09/05/2023 was negative for PE and showed multiple tiny lung nodules up to 5mm, no high concern for metastasis, repeated CT in 11/2023 was stable   Assessment & Plan Rectal cancer, status post resection, under surveillance Rectal cancer diagnosed in October 2022, status post resection. Currently under surveillance with no signs of recurrence on recent CT scan. Last colonoscopy in September 2024 showed no recurrence. Ostomy not yet reversed, with plans for reversal next year after strengthening pelvic floor muscles. - Continue surveillance with CT scan next year to ensure resolution of blood clots and no  recurrence. - Plan for ostomy reversal next year after pelvic floor strengthening. - Will schedule follow-up colonoscopy within two years post-reversal.  Ostomy, not yet reversed, planned reversal Ostomy not yet reversed. Plan to reverse after strengthening pelvic floor muscles. He is undergoing pelvic floor exercises and physical therapy to prepare for reversal. - Continue pelvic floor exercises and physical therapy. - Plan for ostomy reversal next year.  Venous thromboembolism, on anticoagulation Venous thromboembolism diagnosed in August 2024, currently on Eliquis . Blood clots likely formed in the lungs. Family history of blood clots related to cancer. - Continue Eliquis  for at least three months without interaction. - Advised caution to avoid injury due to bleeding risk. - Informed dental and medical providers about anticoagulation therapy for procedures. - Will plan for CT scan next year to ensure resolution of blood clots.  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 Symptoms have improved significantly, with residual numbness in the tips of toes. - Continue monitoring symptoms.  Erectile dysfunction, post cancer treatment Erectile dysfunction likely related to radiation therapy affecting pelvic nerves. Symptoms are improving but not fully resolved. No current urologist involved in care. - Referred to [facility] for evaluation and management of erectile dysfunction.  Nail changes secondary to chemotherapy Nail changes, particularly in the big toenail, likely secondary to chemotherapy. New nail growth observed, with no signs of infection or pain. - Continue to monitor nail changes.  Plan - He is clinically doing well, lab reviewed, exam was unremarkable, including rectal exam. - Continue surveillance, lab and follow-up in 4 months. - Plan to repeat CT scan in August 2026, or sooner  if he has ostomy reversal before August of next year  SUMMARY OF ONCOLOGIC HISTORY: Oncology History  Rectal cancer (HCC)  08/18/2021 Initial Diagnosis   Rectal cancer (HCC)   09/14/2021 Procedure   Colonoscopy by Dr. [name] -The perianal examination was normal. - The digital rectal exam revealed a firm rectal mass palpated 1-2 cm from the anal verge. The mass was non-circumferential and located predominantly at the left bowel wall. - An ulcerated non-obstructing medium-sized friable mass was found in the distal rectum. The mass was non-circumferential. The mass measured three cm in length. No bleeding was present. Biopsies (5 passes total for 10 total biopsy specimens)were taken with a cold forceps for histology. Estimated blood loss was minimal. - The exam was otherwise normal throughout the examined colon. - The terminal ileum appeared normal. - No additional abnormalities were found on retroflexion   09/14/2021 Initial Biopsy   Diagnosis Rectum, biopsy, mass - INVASIVE ADENOCARCINOMA.  MMR normal  Baseline CEA 3.9     09/17/2021 Imaging   CT CAP IMPRESSION: 1. Known left rectal mass measures approximately 3.3 x 1.9 x 4.1 cm and is associated with asymmetric haziness in the left perirectal fat and small left perirectal lymph nodes. 2. No findings highly suspicious for distant metastatic disease. There are 2 small indeterminate low-density hepatic lesions. Consider MRI for further characterization. 3. No suspicious findings in the chest. Small perifissural nodules bilaterally, likely benign.   09/25/2021 Imaging      Outside liver and pelvic MRI   10/13/2021 Cancer Staging   Staging form: Colon and Rectum, AJCC 8th Edition - Clinical stage from 10/13/2021: Stage IIIB (cT3, cN1, cM0) - Signed by Lanny Callander, MD on 10/23/2021 Stage prefix: Initial diagnosis   10/23/2021 - 02/05/2022 Chemotherapy   Patient is on Treatment Plan : RECTAL Xelox (Capeox) q21d x 6 cycles      02/18/2022 Imaging   CT CHEST ABDOMEN PELVIS W IV CONTRAST   Impression  IMPRESSION:  1.  Soft tissue mass along the left lateral aspect of the rectum and adjacent infiltration of the perirectal fat on the left, measures approximately 2.5 x 3.9 cm.  2.  There is no adenopathy or evidence of distant metastatic disease.   3.  Hepatic steatosis.       Discussed the use of AI scribe software for clinical note transcription with the patient, who gave verbal consent to proceed.  History of Present Illness Bryan [name] is a 53 year old male with rectal cancer who presents for follow-up.  He was diagnosed with rectal cancer in October 2022 and continues treatment. He has not had his ostomy reversed and is working on pelvic floor strengthening. He plans to consider reversal next year.  He has blood clots since August 2025 and is on Eliquis . There are no significant symptoms from the blood clots and no leg pain. His mother had blood clots following cancer treatment. He did not have blood clots prior to his cancer and treatment.  Chemotherapy-induced neuropathy has improved significantly. Neuropathy in his hands is resolved, and it is almost gone from his feet, with some symptoms remaining in the tips of his toes. His big toenail appears to be coming off, attributed to chemotherapy.  He is experiencing issues with intimacy and erections, attributed to chemotherapy and radiation. He is currently only taking Eliquis , with no copay, and uses [facility] for prescriptions, considering mail order for convenience.     All other systems were reviewed with the patient and are negative.  MEDICAL HISTORY:  Past Medical History:  Diagnosis Date  Allergic rhinitis, cause unspecified    spring only   Clotting disorder    Hypercholesteremia    Pulmonary embolus (HCC) 03/28/2023   CT scan (07/28/2023) now negative for blood clots   Rectal cancer (HCC) 09/14/2021    SURGICAL HISTORY: Past Surgical  History:  Procedure Laterality Date   DIVERTING ILEOSTOMY N/A 07/01/2022   Procedure: DIVERTING LOOP ILEOSTOMY;  Surgeon: Teresa Lonni HERO, MD;  Location: [facility];  Service: General;  Laterality: N/A;   FLEXIBLE SIGMOIDOSCOPY N/A 07/01/2022   Procedure: FLEXIBLE SIGMOIDOSCOPY;  Surgeon: Teresa Lonni HERO, MD;  Location: [facility];  Service: General;  Laterality: N/A;   FLEXIBLE SIGMOIDOSCOPY N/A 02/04/2023   Procedure: DIAGNOSTIC FLEXIBLE SIGMOIDOSCOPY;  Surgeon: Teresa Lonni HERO, MD;  Location: [facility];  Service: General;  Laterality: N/A;   FLEXIBLE SIGMOIDOSCOPY N/A 06/02/2023   Procedure: FLEXIBLE SIGMOIDOSCOPY- DIAGNOSTIC;  Surgeon: Teresa Lonni HERO, MD;  Location: [facility];  Service: General;  Laterality: N/A;   IR CATHETER TUBE CHANGE  10/08/2022   IR CATHETER TUBE CHANGE  12/03/2022   IR FLUORO RM 30-60 MIN  02/11/2023   IR RADIOLOGIST EVAL & MGMT  08/03/2022   IR RADIOLOGIST EVAL & MGMT  08/17/2022   IR RADIOLOGIST EVAL & MGMT  09/02/2022   IR RADIOLOGIST EVAL & MGMT  09/30/2022   None     XI ROBOTIC ASSISTED LOWER ANTERIOR RESECTION N/A 07/01/2022   Procedure: XI ROBOTIC ASSISTED LOWER ANTERIOR RESECTION STAPLED COLOANAL ANASTAMOSIS INTRAOP ASSESSMENT OF PERFUSION AND TAPP BLOCKS;  Surgeon: Teresa Lonni HERO, MD;  Location: [facility];  Service: General;  Laterality: N/A;    I have reviewed the social history and family history with the patient and they are unchanged from previous note.  ALLERGIES:  has no known allergies.  MEDICATIONS:  Current Outpatient Medications  Medication Sig Dispense Refill   apixaban  (ELIQUIS ) 5 MG TABS tablet Take 1 tablet (5 mg total) by mouth 2 (two) times daily. 180 tablet 1   No current facility-administered medications for this visit.    PHYSICAL EXAMINATION: ECOG PERFORMANCE STATUS: 0 - Asymptomatic  Vitals:   10/29/24 1151 10/29/24 1155  BP: (!) 136/93 130/84  Pulse: 77   Resp: 17   Temp: 97.8 F (36.6 C)   SpO2: 99%    Wt  Readings from Last 3 Encounters:  10/29/24 177 lb 4.8 oz (80.4 kg)  07/30/24 170 lb 11.2 oz (77.4 kg)  04/02/24 180 lb 8 oz (81.9 kg)     GENERAL:alert, no distress and comfortable SKIN: skin color, texture, turgor are normal, no rashes or significant lesions EYES: normal, Conjunctiva are pink and non-injected, sclera clear NECK: supple, thyroid normal size, non-tender, without nodularity LYMPH:  no palpable lymphadenopathy in the cervical, axillary  LUNGS: clear to auscultation and percussion with normal breathing effort HEART: regular rate & rhythm and no murmurs and no lower extremity edema ABDOMEN:abdomen soft, non-tender and normal bowel sounds, rectal exam was unremarkable. Musculoskeletal:no cyanosis of digits and no clubbing  NEURO: alert & oriented x 3 with fluent speech, no focal motor/sensory deficits  Physical Exam   LABORATORY DATA:  I have reviewed the data as listed    Latest Ref Rng & Units 10/29/2024   11:31 AM 07/30/2024    1:06 PM 04/02/2024    1:15 PM  CBC  WBC 4.0 - 10.5 K/uL 4.2  3.7  4.1   Hemoglobin 13.0 - 17.0 g/dL 85.3  86.1  85.5   Hematocrit 39.0 - 52.0 % 42.8  41.1  42.7   Platelets 150 - 400 K/uL 240  193  261         Latest Ref Rng & Units 10/29/2024   11:31 AM 07/30/2024    1:06 PM 04/02/2024    1:15 PM  CMP  Glucose 70 - 99 mg/dL 895  887  887   BUN 6 - 20 mg/dL 15  15  17    Creatinine 0.61 - 1.24 mg/dL 8.64  8.79  8.77   Sodium 135 - 145 mmol/L 138  135  140   Potassium 3.5 - 5.1 mmol/L 4.6  4.4  4.2   Chloride 98 - 111 mmol/L 104  104  105   CO2 22 - 32 mmol/L 28  28  29    Calcium 8.9 - 10.3 mg/dL 9.3  8.9  9.2   Total Protein 6.5 - 8.1 g/dL 7.0  6.8  7.0   Total Bilirubin 0.0 - 1.2 mg/dL 0.6  0.5  0.5   Alkaline Phos 38 - 126 U/L 90  101  101   AST 15 - 41 U/L 15  16  18    ALT 0 - 44 U/L 21  24  28        RADIOGRAPHIC STUDIES: I have personally reviewed the radiological images as listed and agreed with the findings in the report. No  results found.    No orders of the defined types were placed in this encounter.  All questions were answered. The patient knows to call the clinic with any problems, questions or concerns. No barriers to learning was detected. The total time spent in the appointment was 25 minutes, including review of chart and various tests results, discussions about plan of care and coordination of care plan     Onita Mattock, MD 10/29/2024

## 2025-02-25 ENCOUNTER — Inpatient Hospital Stay: Admitting: Hematology

## 2025-02-25 ENCOUNTER — Inpatient Hospital Stay
